# Patient Record
Sex: Female | Born: 1980 | Race: White | Hispanic: No | Marital: Married | Smoking: Never smoker
Health system: Southern US, Community
[De-identification: ages and names within clinical notes are randomized; demographics above are authoritative.]

## PROBLEM LIST (undated history)

## (undated) DIAGNOSIS — IMO0001 Reserved for inherently not codable concepts without codable children: Secondary | ICD-10-CM

## (undated) DIAGNOSIS — F329 Major depressive disorder, single episode, unspecified: Secondary | ICD-10-CM

## (undated) DIAGNOSIS — Z803 Family history of malignant neoplasm of breast: Secondary | ICD-10-CM

## (undated) DIAGNOSIS — Z800 Family history of malignant neoplasm of digestive organs: Secondary | ICD-10-CM

## (undated) DIAGNOSIS — F419 Anxiety disorder, unspecified: Secondary | ICD-10-CM

## (undated) DIAGNOSIS — F32A Depression, unspecified: Secondary | ICD-10-CM

## (undated) DIAGNOSIS — R51 Headache: Secondary | ICD-10-CM

## (undated) DIAGNOSIS — J189 Pneumonia, unspecified organism: Secondary | ICD-10-CM

## (undated) HISTORY — DX: Family history of malignant neoplasm of digestive organs: Z80.0

## (undated) HISTORY — DX: Family history of malignant neoplasm of breast: Z80.3

## (undated) HISTORY — PX: DENTAL SURGERY: SHX609

---

## 2002-06-22 ENCOUNTER — Other Ambulatory Visit: Admission: RE | Admit: 2002-06-22 | Discharge: 2002-06-22 | Payer: Self-pay | Admitting: Obstetrics and Gynecology

## 2003-12-04 ENCOUNTER — Other Ambulatory Visit: Admission: RE | Admit: 2003-12-04 | Discharge: 2003-12-04 | Payer: Self-pay | Admitting: Family Medicine

## 2004-09-09 ENCOUNTER — Other Ambulatory Visit: Admission: RE | Admit: 2004-09-09 | Discharge: 2004-09-09 | Payer: Self-pay | Admitting: Family Medicine

## 2008-01-05 ENCOUNTER — Other Ambulatory Visit: Admission: RE | Admit: 2008-01-05 | Discharge: 2008-01-05 | Payer: Self-pay | Admitting: Family Medicine

## 2009-01-09 ENCOUNTER — Other Ambulatory Visit: Admission: RE | Admit: 2009-01-09 | Discharge: 2009-01-09 | Payer: Self-pay | Admitting: Family Medicine

## 2010-04-01 ENCOUNTER — Other Ambulatory Visit: Admission: RE | Admit: 2010-04-01 | Discharge: 2010-04-01 | Payer: Self-pay | Admitting: Family Medicine

## 2010-06-23 NOTE — L&D Delivery Note (Signed)
Delivery Note  C/C at 1925, spont. Pushing, in birth tub squatting. FHR cat. 1 in 2nd stage. At 8:05 PM a viable female was delivered in squatting position in water tub by CNM via Vaginal, Spontaneous Delivery (Presentation: Left Occiput Anterior, R nuchal arm).  APGAR: 8, 9; weight 7-11 .   Placenta status: Intact, Spontaneous, del on bed.  Cord: 3 vessels with the following complications: None.  Cord pH: n/a. Attempted cord blood collection but no blood in cord after delayed clamping.   Anesthesia: Local  Episiotomy: None Lacerations: 2nd degree Suture Repair: 3.0 vicryl rapide Est. Blood Loss (mL): 300 cc  Mom to postpartum.  Baby to mother's room.Marland Kitchen  Melanie Ramirez 03/21/2011, 9:04 PM

## 2010-08-29 LAB — ABO/RH

## 2010-08-29 LAB — RPR: RPR: NONREACTIVE

## 2011-03-21 ENCOUNTER — Inpatient Hospital Stay (HOSPITAL_COMMUNITY)
Admission: AD | Admit: 2011-03-21 | Discharge: 2011-03-23 | DRG: 373 | Disposition: A | Discharge status: Home / Self Care | Payer: BC Managed Care – PPO | Source: Ambulatory Visit

## 2011-03-21 ENCOUNTER — Encounter (HOSPITAL_COMMUNITY): Payer: Self-pay | Admitting: Obstetrics

## 2011-03-21 DIAGNOSIS — IMO0001 Reserved for inherently not codable concepts without codable children: Secondary | ICD-10-CM

## 2011-03-21 HISTORY — DX: Anxiety disorder, unspecified: F41.9

## 2011-03-21 HISTORY — DX: Major depressive disorder, single episode, unspecified: F32.9

## 2011-03-21 HISTORY — DX: Depression, unspecified: F32.A

## 2011-03-21 HISTORY — DX: Reserved for inherently not codable concepts without codable children: IMO0001

## 2011-03-21 LAB — CBC
HCT: 35.7 % — ABNORMAL LOW (ref 36.0–46.0)
Hemoglobin: 12.9 g/dL (ref 12.0–15.0)
MCH: 32.6 pg (ref 26.0–34.0)
MCHC: 36.1 g/dL — ABNORMAL HIGH (ref 30.0–36.0)
RBC: 3.96 MIL/uL (ref 3.87–5.11)

## 2011-03-21 LAB — ABO/RH: ABO/RH(D): O POS

## 2011-03-21 MED ORDER — FLEET ENEMA 7-19 GM/118ML RE ENEM: 1.0000 | ENEMA | RECTAL | Status: DC | PRN

## 2011-03-21 MED ORDER — NALBUPHINE SYRINGE 5 MG/0.5 ML
10.0000 mg | INJECTION | INTRAMUSCULAR | Status: DC | PRN
  Administered 2011-03-21: 10 mg via INTRAVENOUS
  Filled 2011-03-21 (×2): qty 1

## 2011-03-21 MED ORDER — DIPHENHYDRAMINE HCL 25 MG PO CAPS: 25.0000 mg | ORAL_CAPSULE | Freq: Four times a day (QID) | ORAL | Status: DC | PRN

## 2011-03-21 MED ORDER — OXYTOCIN 20 UNITS IN LACTATED RINGERS INFUSION - SIMPLE
125.0000 mL/h | Freq: Once | INTRAVENOUS | Status: AC
  Administered 2011-03-21: 999 mL/h via INTRAVENOUS

## 2011-03-21 MED ORDER — OXYTOCIN BOLUS FROM INFUSION
500.0000 mL | Freq: Once | INTRAVENOUS | Status: DC
  Filled 2011-03-21: qty 1000
  Filled 2011-03-21: qty 500

## 2011-03-21 MED ORDER — BISACODYL 10 MG RE SUPP: 10.0000 mg | Freq: Every day | RECTAL | Status: DC | PRN

## 2011-03-21 MED ORDER — BENZOCAINE-MENTHOL 20-0.5 % EX AERO
INHALATION_SPRAY | CUTANEOUS | Status: AC
  Administered 2011-03-22: 1 via TOPICAL
  Filled 2011-03-21: qty 56

## 2011-03-21 MED ORDER — INFLUENZA VIRUS VACC SPLIT PF IM SUSP
0.5000 mL | Freq: Once | INTRAMUSCULAR | Status: DC
  Filled 2011-03-21: qty 0.5

## 2011-03-21 MED ORDER — PROMETHAZINE HCL 25 MG/ML IJ SOLN
25.0000 mg | INTRAMUSCULAR | Status: DC | PRN
  Administered 2011-03-21: 25 mg via INTRAVENOUS
  Filled 2011-03-21: qty 1

## 2011-03-21 MED ORDER — ONDANSETRON HCL 4 MG/2ML IJ SOLN: 4.0000 mg | Freq: Four times a day (QID) | INTRAMUSCULAR | Status: DC | PRN

## 2011-03-21 MED ORDER — LACTATED RINGERS IV SOLN: INTRAVENOUS | Status: DC

## 2011-03-21 MED ORDER — IBUPROFEN 600 MG PO TABS: 600.0000 mg | ORAL_TABLET | Freq: Four times a day (QID) | ORAL | Status: DC | PRN

## 2011-03-21 MED ORDER — LANOLIN HYDROUS EX OINT: TOPICAL_OINTMENT | CUTANEOUS | Status: DC | PRN

## 2011-03-21 MED ORDER — ACETAMINOPHEN 325 MG PO TABS: 650.0000 mg | ORAL_TABLET | ORAL | Status: DC | PRN

## 2011-03-21 MED ORDER — IBUPROFEN 600 MG PO TABS
600.0000 mg | ORAL_TABLET | Freq: Four times a day (QID) | ORAL | Status: DC
  Administered 2011-03-21 – 2011-03-23 (×4): 600 mg via ORAL
  Filled 2011-03-21 (×6): qty 1

## 2011-03-21 MED ORDER — ZOLPIDEM TARTRATE 5 MG PO TABS: 5.0000 mg | ORAL_TABLET | Freq: Every evening | ORAL | Status: DC | PRN

## 2011-03-21 MED ORDER — WITCH HAZEL-GLYCERIN EX PADS: 1.0000 "application " | MEDICATED_PAD | CUTANEOUS | Status: DC | PRN

## 2011-03-21 MED ORDER — LACTATED RINGERS IV SOLN
500.0000 mL | INTRAVENOUS | Status: DC | PRN
  Administered 2011-03-21: 500 mL via INTRAVENOUS

## 2011-03-21 MED ORDER — SIMETHICONE 80 MG PO CHEW: 80.0000 mg | CHEWABLE_TABLET | ORAL | Status: DC | PRN

## 2011-03-21 MED ORDER — LIDOCAINE HCL (PF) 1 % IJ SOLN
30.0000 mL | INTRAMUSCULAR | Status: DC | PRN
  Administered 2011-03-21: 30 mL via SUBCUTANEOUS
  Filled 2011-03-21: qty 30

## 2011-03-21 MED ORDER — OXYCODONE-ACETAMINOPHEN 5-325 MG PO TABS: 2.0000 | ORAL_TABLET | ORAL | Status: DC | PRN

## 2011-03-21 MED ORDER — OXYCODONE-ACETAMINOPHEN 5-325 MG PO TABS: 1.0000 | ORAL_TABLET | ORAL | Status: DC | PRN

## 2011-03-21 MED ORDER — CITRIC ACID-SODIUM CITRATE 334-500 MG/5ML PO SOLN: 30.0000 mL | ORAL | Status: DC | PRN

## 2011-03-21 MED ORDER — ONDANSETRON HCL 4 MG PO TABS: 4.0000 mg | ORAL_TABLET | ORAL | Status: DC | PRN

## 2011-03-21 MED ORDER — ONDANSETRON HCL 4 MG/2ML IJ SOLN: 4.0000 mg | INTRAMUSCULAR | Status: DC | PRN

## 2011-03-21 MED ORDER — PRENATAL PLUS 27-1 MG PO TABS
1.0000 | ORAL_TABLET | Freq: Every day | ORAL | Status: DC
Start: 2011-03-22 — End: 2011-03-23
  Administered 2011-03-22: 1 via ORAL
  Filled 2011-03-21: qty 1

## 2011-03-21 MED ORDER — INFLUENZA VIRUS VACC SPLIT PF IM SUSP
0.5000 mL | Freq: Once | INTRAMUSCULAR | Status: AC
  Administered 2011-03-23: 0.5 mL via INTRAMUSCULAR
  Filled 2011-03-21 (×2): qty 0.5

## 2011-03-21 MED ORDER — BENZOCAINE-MENTHOL 20-0.5 % EX AERO
1.0000 "application " | INHALATION_SPRAY | CUTANEOUS | Status: DC | PRN
  Administered 2011-03-22: 1 via TOPICAL

## 2011-03-21 MED ORDER — OXYTOCIN 10 UNIT/ML IJ SOLN
INTRAMUSCULAR | Status: AC
  Filled 2011-03-21: qty 2

## 2011-03-21 MED ORDER — DIBUCAINE 1 % RE OINT: 1.0000 "application " | TOPICAL_OINTMENT | RECTAL | Status: DC | PRN

## 2011-03-21 MED ORDER — SENNOSIDES-DOCUSATE SODIUM 8.6-50 MG PO TABS
2.0000 | ORAL_TABLET | Freq: Every day | ORAL | Status: DC
  Administered 2011-03-22: 2 via ORAL

## 2011-03-21 NOTE — Progress Notes (Signed)
  S: Out of water tub to bed, (+) back pain, vocalizing, doula and spouse supportive at bedside.  O: BP 114/60  Pulse 97  Temp(Src) 98 F (36.7 C) (Oral)  Resp 18  Ht 5\' 4"  (1.626 m)  Wt 79.833 kg (176 lb)  BMI 30.21 kg/m2  SpO2 99%   FHT:  FHR: 125 bpm, variability: moderate,  accelerations:  Present,  decelerations:  Absent UC:   regular, every 2-4 minutes, coupling SVE:   Dilation: 5 Effacement (%): 100 Station: 0 Exam by:: Amarie Viles, CNM (+) show  A / P: Protracted active phase  Fetal Wellbeing:  Category I Pain Control:  Offered IV main meds, encouraged rest in extreme lateral positions to encourage cephalic alignment Plan Nubain and Phenergan  Anticipated MOD:  NSVD  Melanie Ramirez 03/21/2011, 6:05 PM

## 2011-03-21 NOTE — Progress Notes (Signed)
Melanie Ramirez is a 30 y.o. G1P0 at [redacted]w[redacted]d by LMP admitted for active labor, rupture of membranes  Subjective: Laboring in various positions, ctx's more intense now, in shower, desires cervical check   Objective: BP 129/67  Pulse 98  Temp(Src) 98 F (36.7 C) (Oral)  Resp 16  Ht 5\' 4"  (1.626 m)  Wt 79.833 kg (176 lb)  BMI 30.21 kg/m2       FHT:  FHR: 120 bpm, variability: moderate,  accelerations:  Present,  decelerations:  Absent UC:   regular, every 2-3 minutes SVE:   Dilation: 4 Effacement (%): 100 Station: 0 Exam by:: Renae Fickle, CNM  Labs: Lab Results  Component Value Date   WBC 12.9* 03/21/2011   HGB 12.9 03/21/2011   HCT 35.7* 03/21/2011   MCV 90.2 03/21/2011   PLT 191 03/21/2011    Assessment / Plan: Spontaneous labor, progressing normally  Labor: early labor, moving into active stage Preeclampsia:  no signs or symptoms of toxicity Fetal Wellbeing:  Category I Pain Control:  Labor support without medications and hydroherapy. Plan to water tub now. I/D:  n/a Anticipated MOD:  NSVD  PAUL,DANIELA 03/21/2011, 4:11 PM

## 2011-03-21 NOTE — H&P (Signed)
Melanie Ramirez is a 30 y.o. G1P0 at [redacted]w[redacted]d presenting for rupture of membranes and contractions. Pt notes contractions started last night at 10:30 with ROM, increased in intensity since mid morning . Good fetal movement, No vaginal bleeding, + leaking fluid , clear.  Prenatal Course Source of Care:[facility] - Renae Fickle, CNM primary  with onset of care at 9 weeks Pregnancy complications or risk: none Current medications: PNV, evening primrose oil caps  OB History    Grav Para Term Preterm Abortions TAB SAB Ect Mult Living   1              Past Medical History  Diagnosis Date  . Anxiety   . Depression    Past Surgical History  Procedure Date  . Dental surgery    Family History: family history is not on file. Social History:  reports that she has never smoked. She does not have any smokeless tobacco history on file. She reports that she does not drink alcohol or use illicit drugs.  Review of Systems - Negative except as noted    Blood pressure 117/71, pulse 89, temperature 97.6 F (36.4 C), temperature source Oral, resp. rate 16, height 5\' 4"  (1.626 m), weight 79.833 kg (176 lb).  Physical Exam: Blood pressure 117/71, pulse 89, temperature 97.6 F (36.4 C), temperature source Oral, resp. rate 16, height 5\' 4"  (1.626 m), weight 79.833 kg (176 lb). General: NAD Heart: RRR, no murmurs Lungs: CTA b/l  Abd: Soft, NT, EFW 7lbs  Ext: no edema Neuro: DTRs normal Other:    Membranes: grossly SROM at 10:30 pm 9/27 Vaginal bleeding:none Speculum Exam: na Dilation: 3/Effacement (%): 90/Station: -1/vertex/Exam by:: Paul,CNM FHR:  Baseline rate 120   Variability moderate  Accelerations present  Decelerations none Contractions: Frequency 4-5  Duration 60+  Intensity mild/mod  Pertinent Labs/Studies:   CBC    Component Value Date/Time   WBC 12.9* 03/21/2011 1156   RBC 3.96 03/21/2011 1156   HGB 12.9 03/21/2011 1156   HCT 35.7* 03/21/2011 1156   PLT 191 03/21/2011 1156   MCV 90.2 03/21/2011 1156    MCH 32.6 03/21/2011 1156   MCHC 36.1* 03/21/2011 1156   RDW 12.8 03/21/2011 1156    RPR pending  Prenatal labs: ABO, Rh: O/Positive/-- (03/08 0000) Antibody:  neg Rubella:  immune RPR: Nonreactive (03/08 0000)  HBsAg: Negative (03/08 0000)  HIV: Non-reactive (03/08 0000)  GBS: Negative (09/15 0000)  1 hr Glucola 124  Genetic screening declined Ultrasound: Weeks 36 Result EFW 7-3 (89%), AC 97%, nl AFI, post. Placenta, vtx  Assessment: 30 y.o. G1P0 at [redacted]w[redacted]d  1. Labor: early 2. Fetal Wellbeing: Category I  3. Pain Control: relaxation methods / doula, desires water birth 4. GBS: neg 5. 38 week IUP, SROM  Plan:  1. Admit to BS 2. Routine L&D orders, up ad lib w/ intermittent EFM, reg. diet 3. Analgesia/anesthesia PRN     Consultant: Dr. Alesia Banda 03/21/2011, 1:22 PM

## 2011-03-21 NOTE — Progress Notes (Signed)
S: Rested in bed x  1 hr with Nubain and phenergan, extreme lateral positioning. Started feeling urge to push at 7:00 PM, svx 8/c/0 by RN. To water birth tub for transition.  O: BP 117/65  Pulse 108  Temp(Src) 98.3 F (36.8 C) (Axillary)  Resp 20  Ht 5\' 4"  (1.626 m)  Wt 79.833 kg (176 lb)  BMI 30.21 kg/m2  SpO2 99%   FHT:  FHR: 130 bpm, variability: moderate,  accelerations:  Present,  decelerations:  Present early UC:   regular, every 2-3 minutes SVE:   Dilation: 10 Effacement (%): 100 Station: +1 Exam by:: Renae Fickle, CNM   A / P: Spontaneous labor, progressing normally  Fetal Wellbeing:  Category I Pain Control:  IV meds and hydrotherapy  Anticipated MOD:  NSVD  Tanayah Squitieri 03/21/2011, 7:24 PM

## 2011-03-22 LAB — CBC
HCT: 30.6 % — ABNORMAL LOW (ref 36.0–46.0)
MCH: 32.2 pg (ref 26.0–34.0)
MCV: 91.3 fL (ref 78.0–100.0)
Platelets: 179 10*3/uL (ref 150–400)
RDW: 12.9 % (ref 11.5–15.5)

## 2011-03-22 NOTE — Progress Notes (Signed)
PSYCHOSOCIAL ASSESSMENT ~ MATERNAL/CHILD Name:Alise Berniece Andreas :  BabySamson Frederic     Age: 30  Referral Date 03/22/11    Reason/Source: CN-History of anxiety and depression I. FAMILY/HOME ENVIRONMENT A. Child's Legal Guardian __X_Parent(s) ___Grandparent ___Foster parent ___DSS_________________ Name Laurita Quint   DOB 1981/04/11  Age 21 Address: [address] [place], [place] Name_______________________________ DOB___/____/____ Age_____ Address________________________________________________________ B. Other Household Members/Support Persons Name: FOB       Relationship____________ DOB ___/___/___ Name_____________________Relationship____________ DOB ___/___/___ Name_____________________Relationship____________ DOB ___/___/___ Name_____________________Relationship____________ DOB ___/___/___ C. Other Support: Supportive friends and family II. PSYCHOSOCIAL DATA A. Information Source _X_Patient Interview _X_Family Interview __Other___________ B. Archivist __________________________________________________ __Medicaid County__________ _X_Private Insurance: BCBS __Self Pay  __Food Stamps __WIC __Work First __Public Housing __Section 8  __Maternity Care Coordination/Child Service Coordination/Early Intervention _________________________________________________________________School _____________________________________Grade____________ _X_Other: [employer]  C. Cultural and Environment Information Cultural Issues Impacting Care: None reported III. STRENGTHS _X__Supportive family/friends _X__Adequate Resources _X__Compliance with medical plan _X__Home prepared for Child (including basic supplies) _X_Understanding of illness  ___Other__________________________________________________________ IV. RISK FACTORS AND CURRENT PROBLEMS ____ No Problems Noted Pt Family  Substance Abuse ___ ___  Mental Illness History of anxiety and  depression Family/Relationship Issues ___  ___ Abuse/Neglect/Domestic Violence ___ ___ Financial Resources ___ ___ Transportation ___ ___ DSS Involvement ___ ___ Adjustment to Illness ___ ___ Knowledge/Cognitive Deficit ___ ___ Compliance with Treatment ___ ___ Basic Needs (food, housing, etc.) ___ ___ Housing Concerns ___ ___ Other_____________________________________________________________ V. SOCIAL WORK ASSESSMENT  CSW received referral due to MOB having a history of anxiety and depression. CSW met with MOB and FOB in room. MOB was agreeable to FOB staying in room for assessment. CSW spoke with MOB regarding having adequate supplies and support for baby. MOB stated this is their first child but have all necessary supplies. MOB reports that they have supportive friends and family. CSW spoke with MOB regarding her history of anxiety and depression. Per MOB, history of anxiety and depression are present in her family. MOB was completely aware of PPD and reports she has already spoken to her midwife about this. Midwife agrees to continue to assess MOB for any symptoms and agreeable to making referrals if needed. CSW provided MOB with "Feelings After Birth" brochure and educated MOB and FOB on symptoms of baby blues and PPD. MOB was appreciative of CSW consult.    VI. SOCIAL WORK PLAN _X__No Further Intervention Required/No Barriers to Discharge  ___Psychosocial Support and Ongoing Assessment of Needs  _X__Patient/Family Education: "Feelings After Birth" Brochure ___Child Protective Services Report County___________ Date___/____/____  ___Information/Referral to [employer]_________________________  ___Other__________________________________________________________

## 2011-03-22 NOTE — Progress Notes (Signed)
Post Partum Day #1            Subjective:   Pt reports feeling well, got to rest last night, sitting in chair, infant at breast/ Tolerating po/ Voiding without problems/ No n/v/ Bleeding is moderate/ Pain controlled withprescription NSAID's including ibuprofen (Motrin)  Newborn info:  Information for the patient's newborn:  Jadalynn, Burr [[id number]]  female   / feeding breast, doing well          Objective:     VS: Blood pressure 125/74, pulse 78, temperature 98 F (36.7 C), temperature source Oral, resp. rate 22, height 5\' 4"  (1.626 m), weight 79.833 kg (176 lb), SpO2 95.00%, unknown if currently breastfeeding.   Intake/Output Summary (Last 24 hours) at 03/22/11 1314 Last data filed at 03/21/11 2151  Gross per 24 hour  Intake      0 ml  Output      0 ml  Net      0 ml        Basename 03/22/11 0517 03/21/11 1156  WBC 13.8* 12.9*  HGB 10.8* 12.9  HCT 30.6* 35.7*  PLT 179 191     Blood type: --/--/O POS (09/28 1156)  Rubella:   immune   Physical Exam:   A & O x 3 NAD   Lungs: CTAB  Heart: RR  Abdomen: soft, non-tender, FF @ U  Perineum: deferred - breastfeeding  Lochia: small  Extremities: neg Homans', edema none    Assessment/Plan:  PPD # 1 / 30 y.o., G1P1001 S/P:spontaneous vaginal Principal Problem:  *Postpartum care following vaginal delivery (9/28)     normal postpartum exam  Doing well  Continue routine post partum orders  Anticipate discharge home in AM.      LOS: 1 day   Melanie Ramirez, CNM, MSN 03/22/2011, 1:14 PM

## 2011-03-23 MED ORDER — IBUPROFEN 800 MG PO TABS: 800.0000 mg | ORAL_TABLET | Freq: Three times a day (TID) | ORAL | Status: AC | PRN

## 2011-03-23 NOTE — Progress Notes (Signed)
Post Partum Day #2           Information for the patient's newborn:  Shalane, Florendo [[id number]]  female    Subjective: No HA, SOB, CP, F/C, breast symptoms. Pain minimal. Normal vaginal bleeding, no clots.    Feeding: breast  Objective: BP 116/77  Pulse 82  Temp(Src) 98.3 F (36.8 C) (Oral)  Resp 18  Ht 5\' 4"  (1.626 m)  Wt 79.833 kg (176 lb)  BMI 30.21 kg/m2  SpO2 99%  Breastfeeding? Unknown  No intake or output data in the 24 hours ending 03/23/11 1020     Basename 03/22/11 0517 03/21/11 1156  WBC 13.8* 12.9*  HGB 10.8* 12.9  HCT 30.6* 35.7*  PLT 179 191    Blood type: --/--/O POS (09/28 1156) Rubella:   immune   Physical Exam:  General: alert, cooperative and no distress Uterine Fundus: firm Lochia: appropriate Perineum: repair intact, edema none DVT Evaluation: Negative Homan's sign. No significant calf/ankle edema.    Assessment/Plan: PPD # 2 / 30 y.o., G1P1001 S/P:spontaneous vaginal waterbirth  Principal Problem:  *Postpartum care following vaginal delivery (9/28)    normal postpartum exam  Continue current postpartum care  D/C home   LOS: 2 days   PAUL,DANIELA, CNM, MSN 03/23/2011, 10:20 AM

## 2011-03-23 NOTE — Discharge Summary (Signed)
Obstetric Discharge Summary Reason for Admission: onset of labor and rupture of membranes Prenatal Procedures: ultrasound Intrapartum Procedures: spontaneous vaginal delivery and water birth Postpartum Procedures: influenza vaccine Complications-Operative and Postpartum: 2nd degree perineal laceration Hemoglobin  Date Value Range Status  03/22/2011 10.8* 12.0-15.0 (g/dL) Final     HCT  Date Value Range Status  03/22/2011 30.6* 36.0-46.0 (%) Final    Discharge Diagnoses: Term Pregnancy-delivered  Discharge Information: Date: 03/23/2011 Activity: pelvic rest Diet: routine Medications: PNV and Ibuprofen Condition: stable Instructions: refer to practice specific booklet Discharge to: home Follow-up Information    Follow up with Sakina Briones in 6 weeks.   Contact information:   [address] [address] [phone]          Newborn Data: Live born female "Melanie Ramirez" Birth Weight: 7 lb 11 oz (3487 g) APGAR: 8, 9  Home with mother.  Melanie Ramirez 03/23/2011, 10:23 AM

## 2011-04-01 NOTE — Progress Notes (Signed)
UR Chart review completed.  

## 2013-06-23 NOTE — L&D Delivery Note (Signed)
Delivery Note  First Stage: Labor onset: 0015 on 12/24 Augmentation : pitocin at midnight for protracted labor secondary to inadequate ctx Analgesia /Anesthesia intrapartum: nubain 10 mg IV x 1 and rested until awoke with urge to push SROM at 2400 on 12/23  Second Stage:  Pitocin off - IV saline locked 0145 with urge to push - requesting to enter tub now (water temp 98) Complete dilation at 0155  Onset of pushing at 0155 involuntarily pushing FHR second stage 130 - continuous to intermittent with transition to tub  Assisted into tub between ctx vtx +3 @ 0200  Delivery of a viable female at [place] by CNM in LOA position no nuchal cord Cord double clamped after cessation of pulsation, cut by FOB  Baby to Dad for skin to skin while Mom assisted out of tub to bed Cord blood sample collected   Third Stage: Placenta delivered [facility] intact with 3 VC @ 0224 Placenta disposition: for hospital disposal Uterine tone fimr / bleeding small IV infiltrated - DC'd  / Pitocin 10 units IM  1st degree perineal with vaginal extension identified  Anesthesia for repair: 1% xylocaine Repair 3-0 vicryl vaginal closure with transition to perineal body then 4-0 vicryl subcuticular Est. Blood Loss (mL): 503  Complications: none  Mom to postpartum.  Baby to Couplet care / Skin to Skin.  Newborn: Birth Weight: 7 pounds - 5 ounces Apgar Scores: 9-9 Feeding planned: breast  Artelia Laroche CNM, MSN, [place] 06/16/2014, 2:45 AM

## 2013-07-25 ENCOUNTER — Other Ambulatory Visit: Payer: Self-pay | Admitting: Obstetrics and Gynecology

## 2013-07-25 ENCOUNTER — Encounter (HOSPITAL_COMMUNITY): Payer: Self-pay | Admitting: Pharmacist

## 2013-07-26 ENCOUNTER — Encounter (HOSPITAL_COMMUNITY): Payer: Self-pay

## 2013-07-28 NOTE — H&P (Signed)
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  HISTORY OF PRESENT ILLNESS:  The patient is a 33 year old white female, G2, P1, with recently diagnosed fetal abnormalities to include cystic hygroma and noninvasive prenatal DNA testing consistent with monosomy X. The patient wishes to proceed with termination of pregnancy.  MEDICATIONS:  Prenatal vitamins.  ALLERGIES:  None.  FAMILY HISTORY:  Anemia, thyroid disease, breast cancer, kidney stones.  SOCIAL HISTORY:  Noncontributory.  SURGICAL HISTORY:  Noncontributory.  OBSTETRIC HISTORY:  Remarkable for vaginal delivery x1.  PHYSICAL EXAMINATION:  GENERAL:  She is a well-developed, well- nourished, white female, in no acute distress. HEENT:  Normal. NECK:  Supple.  Full range of motion. LUNGS:  Clear. ABDOMEN:  Soft, nontender. PELVIC:  Uterus is 12-14 weeks size.  No adnexal masses. EXTREMITIES:  There are no cords. NEUROLOGIC:  Nonfocal. SKIN:  Intact.  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  PLAN:  Proceed with late trimester termination of pregnancy under ultrasound guidance.  Risks of surgery to include anesthesia, infection, bleeding, uterine perforation, possible need for repair, delayed versus immediate complications to include bowel and bladder injury noted.  The patient acknowledges and wishes to proceed.     Lovenia Kim, M.D.     RJT/MEDQ  D:  07/28/2013  T:  07/28/2013  Job:  [id number]

## 2013-07-29 ENCOUNTER — Encounter (HOSPITAL_COMMUNITY): Payer: BC Managed Care – PPO | Admitting: Anesthesiology

## 2013-07-29 ENCOUNTER — Encounter (HOSPITAL_COMMUNITY)
Admission: RE | Disposition: A | Discharge status: Home / Self Care | Payer: Self-pay | Source: Ambulatory Visit | Attending: Obstetrics and Gynecology

## 2013-07-29 ENCOUNTER — Ambulatory Visit (HOSPITAL_COMMUNITY)
Admission: RE | Admit: 2013-07-29 | Discharge: 2013-07-29 | Disposition: A | Discharge status: Home / Self Care | Payer: BC Managed Care – PPO | Source: Ambulatory Visit | Attending: Obstetrics and Gynecology | Admitting: Obstetrics and Gynecology

## 2013-07-29 ENCOUNTER — Ambulatory Visit (HOSPITAL_COMMUNITY): Payer: BC Managed Care – PPO | Admitting: Anesthesiology

## 2013-07-29 ENCOUNTER — Encounter (HOSPITAL_COMMUNITY): Payer: Self-pay | Admitting: Anesthesiology

## 2013-07-29 ENCOUNTER — Ambulatory Visit (HOSPITAL_COMMUNITY): Payer: BC Managed Care – PPO

## 2013-07-29 DIAGNOSIS — Q998 Other specified chromosome abnormalities: Secondary | ICD-10-CM

## 2013-07-29 DIAGNOSIS — O039 Complete or unspecified spontaneous abortion without complication: Secondary | ICD-10-CM | POA: Insufficient documentation

## 2013-07-29 DIAGNOSIS — Q958 Other balanced rearrangements and structural markers: Secondary | ICD-10-CM

## 2013-07-29 DIAGNOSIS — Z332 Encounter for elective termination of pregnancy: Principal | ICD-10-CM

## 2013-07-29 DIAGNOSIS — O358XX0 Maternal care for other (suspected) fetal abnormality and damage, not applicable or unspecified: Secondary | ICD-10-CM | POA: Insufficient documentation

## 2013-07-29 HISTORY — PX: DILATION AND EVACUATION: SHX1459

## 2013-07-29 HISTORY — DX: Headache: R51

## 2013-07-29 HISTORY — DX: Pneumonia, unspecified organism: J18.9

## 2013-07-29 LAB — CBC
HCT: 37.1 % (ref 36.0–46.0)
Hemoglobin: 13.4 g/dL (ref 12.0–15.0)
MCH: 30.5 pg (ref 26.0–34.0)
MCHC: 36.1 g/dL — ABNORMAL HIGH (ref 30.0–36.0)
MCV: 84.5 fL (ref 78.0–100.0)
PLATELETS: 169 10*3/uL (ref 150–400)
RBC: 4.39 MIL/uL (ref 3.87–5.11)
RDW: 12.7 % (ref 11.5–15.5)
WBC: 7.5 10*3/uL (ref 4.0–10.5)

## 2013-07-29 SURGERY — DILATION AND EVACUATION, UTERUS, SECOND TRIMESTER
Anesthesia: Monitor Anesthesia Care | Site: Vagina

## 2013-07-29 MED ORDER — ONDANSETRON HCL 4 MG/2ML IJ SOLN
INTRAMUSCULAR | Status: DC | PRN
  Administered 2013-07-29: 4 mg via INTRAVENOUS

## 2013-07-29 MED ORDER — LIDOCAINE HCL (CARDIAC) 20 MG/ML IV SOLN
INTRAVENOUS | Status: DC | PRN
  Administered 2013-07-29: 50 mg via INTRAVENOUS

## 2013-07-29 MED ORDER — PROMETHAZINE HCL 25 MG/ML IJ SOLN: 6.2500 mg | INTRAMUSCULAR | Status: DC | PRN

## 2013-07-29 MED ORDER — MIDAZOLAM HCL 2 MG/2ML IJ SOLN: 0.5000 mg | Freq: Once | INTRAMUSCULAR | Status: DC | PRN

## 2013-07-29 MED ORDER — TRAMADOL HCL 50 MG PO TABS: 50.0000 mg | ORAL_TABLET | Freq: Four times a day (QID) | ORAL | Status: DC | PRN

## 2013-07-29 MED ORDER — FENTANYL CITRATE 0.05 MG/ML IJ SOLN: 25.0000 ug | INTRAMUSCULAR | Status: DC | PRN

## 2013-07-29 MED ORDER — PROPOFOL INFUSION 10 MG/ML OPTIME
INTRAVENOUS | Status: DC | PRN
  Administered 2013-07-29: 160 ug/kg/min via INTRAVENOUS

## 2013-07-29 MED ORDER — MIDAZOLAM HCL 5 MG/5ML IJ SOLN
INTRAMUSCULAR | Status: DC | PRN
  Administered 2013-07-29: 2 mg via INTRAVENOUS

## 2013-07-29 MED ORDER — MEPERIDINE HCL 25 MG/ML IJ SOLN: 6.2500 mg | INTRAMUSCULAR | Status: DC | PRN

## 2013-07-29 MED ORDER — ONDANSETRON HCL 4 MG/2ML IJ SOLN
INTRAMUSCULAR | Status: AC
  Filled 2013-07-29: qty 2

## 2013-07-29 MED ORDER — KETOROLAC TROMETHAMINE 30 MG/ML IJ SOLN: 15.0000 mg | Freq: Once | INTRAMUSCULAR | Status: DC | PRN

## 2013-07-29 MED ORDER — BUPIVACAINE HCL (PF) 0.25 % IJ SOLN
INTRAMUSCULAR | Status: AC
Start: 2013-07-29 — End: 2013-07-29
  Filled 2013-07-29: qty 30

## 2013-07-29 MED ORDER — FENTANYL CITRATE 0.05 MG/ML IJ SOLN
INTRAMUSCULAR | Status: AC
  Filled 2013-07-29: qty 2

## 2013-07-29 MED ORDER — BUPIVACAINE HCL (PF) 0.25 % IJ SOLN
INTRAMUSCULAR | Status: AC
  Filled 2013-07-29: qty 10

## 2013-07-29 MED ORDER — LIDOCAINE HCL (CARDIAC) 20 MG/ML IV SOLN
INTRAVENOUS | Status: AC
  Filled 2013-07-29: qty 5

## 2013-07-29 MED ORDER — BUPIVACAINE HCL (PF) 0.25 % IJ SOLN
INTRAMUSCULAR | Status: DC | PRN
  Administered 2013-07-29: 20 mL

## 2013-07-29 MED ORDER — KETOROLAC TROMETHAMINE 30 MG/ML IJ SOLN
INTRAMUSCULAR | Status: AC
  Filled 2013-07-29: qty 1

## 2013-07-29 MED ORDER — LACTATED RINGERS IV SOLN
INTRAVENOUS | Status: DC
  Administered 2013-07-29: 11:00:00 via INTRAVENOUS

## 2013-07-29 MED ORDER — PROPOFOL 10 MG/ML IV EMUL
INTRAVENOUS | Status: AC
  Filled 2013-07-29: qty 40

## 2013-07-29 MED ORDER — MIDAZOLAM HCL 2 MG/2ML IJ SOLN
INTRAMUSCULAR | Status: AC
  Filled 2013-07-29: qty 2

## 2013-07-29 MED ORDER — KETOROLAC TROMETHAMINE 60 MG/2ML IM SOLN
INTRAMUSCULAR | Status: DC | PRN
  Administered 2013-07-29: 30 mg via INTRAMUSCULAR

## 2013-07-29 MED ORDER — FENTANYL CITRATE 0.05 MG/ML IJ SOLN
INTRAMUSCULAR | Status: DC | PRN
  Administered 2013-07-29 (×2): 50 ug via INTRAVENOUS

## 2013-07-29 MED ORDER — CEFAZOLIN SODIUM-DEXTROSE 2-3 GM-% IV SOLR
2.0000 g | INTRAVENOUS | Status: AC
  Administered 2013-07-29: 2 g via INTRAVENOUS

## 2013-07-29 SURGICAL SUPPLY — 18 items
CLOTH BEACON ORANGE TIMEOUT ST (SAFETY) ×2 IMPLANT
CONT PATH 16OZ SNAP LID 3702 (MISCELLANEOUS) IMPLANT
DRAPE HYSTEROSCOPY (DRAPE) ×2 IMPLANT
GLOVE BIO SURGEON STRL SZ7.5 (GLOVE) ×2 IMPLANT
GOWN PREVENTION PLUS XLARGE (GOWN DISPOSABLE) ×2 IMPLANT
GOWN STRL REIN XL XLG (GOWN DISPOSABLE) ×4 IMPLANT
KIT BERKELEY 2ND TRIMESTER 1/2 (COLLECTOR) ×2 IMPLANT
NEEDLE SPNL 22GX3.5 QUINCKE BK (NEEDLE) ×2 IMPLANT
NS IRRIG 1000ML POUR BTL (IV SOLUTION) ×2 IMPLANT
PACK VAGINAL MINOR WOMEN LF (CUSTOM PROCEDURE TRAY) ×2 IMPLANT
PAD OB MATERNITY 4.3X12.25 (PERSONAL CARE ITEMS) ×2 IMPLANT
PAD PREP 24X48 CUFFED NSTRL (MISCELLANEOUS) ×2 IMPLANT
SYR CONTROL 10ML LL (SYRINGE) ×2 IMPLANT
TOWEL OR 17X24 6PK STRL BLUE (TOWEL DISPOSABLE) ×4 IMPLANT
TUBE VACURETTE 2ND TRIMESTER (CANNULA) ×2 IMPLANT
VACURETTE 12 RIGID CVD (CANNULA) ×2 IMPLANT
VACURETTE 14MM CVD 1/2 BASE (CANNULA) IMPLANT
VACURETTE 16MM ASPIR CVD .5 (CANNULA) IMPLANT

## 2013-07-29 NOTE — Anesthesia Preprocedure Evaluation (Addendum)
Anesthesia Evaluation  Patient identified by MRN, date of birth, ID band Patient awake    Reviewed: Allergy & Precautions, H&P , NPO status , Patient's Chart, lab work & pertinent test results  Airway Mallampati: II      Dental   Pulmonary  breath sounds clear to auscultation        Cardiovascular Exercise Tolerance: Good Rhythm:regular Rate:Normal     Neuro/Psych    GI/Hepatic   Endo/Other    Renal/GU      Musculoskeletal   Abdominal   Peds  Hematology   Anesthesia Other Findings Patient states gestational age at end of 1st trimester.  No nausea or heartburn  Reproductive/Obstetrics (+) Pregnancy (fetal anomalies)                         Anesthesia Physical Anesthesia Plan  ASA: II  Anesthesia Plan: MAC   Post-op Pain Management:    Induction:   Airway Management Planned:   Additional Equipment:   Intra-op Plan:   Post-operative Plan:   Informed Consent: I have reviewed the patients History and Physical, chart, labs and discussed the procedure including the risks, benefits and alternatives for the proposed anesthesia with the patient or authorized representative who has indicated his/her understanding and acceptance.     Plan Discussed with: Anesthesiologist, CRNA and Surgeon  Anesthesia Plan Comments:        Anesthesia Quick Evaluation

## 2013-07-29 NOTE — Op Note (Signed)
07/29/2013  12:07 PM  PATIENT:  Ivonne Andrew  33 y.o. female  PRE-OPERATIVE DIAGNOSIS:  Cystic Hygroma, Monosomy X  POST-OPERATIVE DIAGNOSIS:  Cystic Hygroma, Monosomy X  PROCEDURE:  Procedure(s): Ultrasound guided Suction DILATATION AND EVACUATION (D&E) 2ND TRIMESTER with tissue sent for chromosome analysis  SURGEON:  Surgeon(s): Lovenia Kim, MD  ASSISTANTS: none   ANESTHESIA:   local and IV sedation  ESTIMATED BLOOD LOSS: 50cc   DRAINS: none   LOCAL MEDICATIONS USED:  MARCAINE     SPECIMEN:  Source of Specimen:  POC and tissue for chromosomes  DISPOSITION OF SPECIMEN:  PATHOLOGY  COUNTS:  YES  DICTATION #: [id number]  PLAN OF CARE: dc home  PATIENT DISPOSITION:  PACU - hemodynamically stable.

## 2013-07-29 NOTE — Transfer of Care (Signed)
Immediate Anesthesia Transfer of Care Note  Patient: Melanie Ramirez  Procedure(s) Performed: Procedure(s): Ultrasound guided Suction DILATATION AND EVACUATION (D&E) 2ND TRIMESTER with tissue sent for chromosome analysis (N/A)  Patient Location: PACU  Anesthesia Type:MAC  Level of Consciousness: awake, alert  and oriented  Airway & Oxygen Therapy: Patient Spontanous Breathing  Post-op Assessment: Report given to PACU RN and Post -op Vital signs reviewed and stable  Post vital signs: Reviewed and stable  Complications: No apparent anesthesia complications

## 2013-07-29 NOTE — Discharge Instructions (Signed)

## 2013-07-29 NOTE — Anesthesia Postprocedure Evaluation (Signed)
  Anesthesia Post-op Note  Anesthesia Post Note  Patient: Melanie Ramirez  Procedure(s) Performed: Procedure(s) (LRB): Ultrasound guided Suction DILATATION AND EVACUATION (D&E) 2ND TRIMESTER with tissue sent for chromosome analysis (N/A)  Anesthesia type: MAC  Patient location: PACU  Post pain: Pain level controlled  Post assessment: Post-op Vital signs reviewed  Last Vitals:  Filed Vitals:   07/29/13 1300  BP: 104/53  Pulse: 62  Temp: 37.3 C  Resp: 16    Post vital signs: Reviewed  Level of consciousness: sedated  Complications: No apparent anesthesia complications

## 2013-07-29 NOTE — Progress Notes (Signed)
Patient ID: Melanie Ramirez, female   DOB: 02-14-1981, 33 y.o.   MRN: [id number] Patient seen and examined. Consent witnessed and signed. No changes noted. Update completed.

## 2013-07-30 NOTE — Op Note (Signed)
NAMEHADLIE, Melanie Ramirez NO.:  [id number]  MEDICAL RECORD NO.:  [id number]  LOCATION:  [facility]                          FACILITY:  [facility]  PHYSICIAN:  Lovenia Kim, M.D.DATE OF BIRTH:  1981-04-23  DATE OF PROCEDURE:  07/29/2013 DATE OF DISCHARGE:  07/29/2013                              OPERATIVE REPORT   PREOPERATIVE DIAGNOSIS:  Cystic hygroma and monosomy X.  POSTOPERATIVE DIAGNOSIS:  Cystic hygroma and monosomy X.  PROCEDURE:  Ultrasound-guided late trimester suction dilation and evacuation.  SURGEON:  Lovenia Kim, MD  ASSISTANT:  None.  ANESTHESIA:  Local and IV sedation.  ESTIMATED BLOOD LOSS:  50 mL.  COMPLICATIONS:  None.  DRAINS:  None.  COUNTS:  Correct.  The patient to recovery in good condition.  BRIEF OPERATIVE NOTE:  After being apprised of the risks of anesthesia, infection, bleeding, injury to surrounding organs, possible need for repair, delayed versus immediate complications to include bowel and bladder injury, possible need for repair, the patient was brought to the operating room where she was administered IV anesthesia  and sedation without difficulty.  Prepped and draped in usual sterile fashion. Catheterized until the bladder was empty.  A dilute Marcaine solution placed for a standard paracervical block after preoperative removal of 2 laminaria and two 4 x 4 gauze sponges.  Cervix easily dilated up to a #39 Pratt dilator, 12-mm suction curette placed.  Amniotomy, clear fluid under ultrasound guidance.  Fetal heart tones were noted preprocedure to be in the 200-beat per minute range.  Under ultrasound guidance with the use of Sopher forceps, a tissue extraction was performed, suction assisted and ultrasound assisted until the uterine cavity was evacuated. Tissue was collected and sent for chromosomal analysis.  Blunt curettage in a 4-quadrant method revealed the cavity to be empty.  Good hemostasis was noted.   All instruments were removed.  Ultrasound revealed the cavity to be empty. The patient tolerated the procedure well, was transferred to recovery in good condition.     Lovenia Kim, M.D.     RJT/MEDQ  D:  07/29/2013  T:  07/30/2013  Job:  [id number]

## 2013-08-01 ENCOUNTER — Encounter (HOSPITAL_COMMUNITY): Payer: Self-pay | Admitting: Obstetrics and Gynecology

## 2013-08-10 LAB — CHROMOSOME STD, POC(TISSUE)-NCBH

## 2013-08-11 LAB — TISSUE HYBRIDIZATION TO NCBH

## 2013-11-24 LAB — OB RESULTS CONSOLE HIV ANTIBODY (ROUTINE TESTING): HIV: NONREACTIVE

## 2013-11-24 LAB — OB RESULTS CONSOLE RUBELLA ANTIBODY, IGM: Rubella: IMMUNE

## 2013-11-24 LAB — OB RESULTS CONSOLE GBS: STREP GROUP B AG: NEGATIVE

## 2013-12-02 LAB — OB RESULTS CONSOLE GC/CHLAMYDIA
Chlamydia: NEGATIVE
GC PROBE AMP, GENITAL: NEGATIVE

## 2014-04-24 ENCOUNTER — Encounter (HOSPITAL_COMMUNITY): Payer: Self-pay | Admitting: Obstetrics and Gynecology

## 2014-05-22 LAB — OB RESULTS CONSOLE GBS: STREP GROUP B AG: NEGATIVE

## 2014-06-15 ENCOUNTER — Encounter (HOSPITAL_COMMUNITY): Payer: Self-pay | Admitting: *Deleted

## 2014-06-15 ENCOUNTER — Inpatient Hospital Stay (HOSPITAL_COMMUNITY)
Admission: AD | Admit: 2014-06-15 | Discharge: 2014-06-17 | DRG: 775 | Disposition: A | Discharge status: Home / Self Care | Payer: BC Managed Care – PPO | Source: Ambulatory Visit | Attending: Obstetrics | Admitting: Obstetrics

## 2014-06-15 DIAGNOSIS — Z3A39 39 weeks gestation of pregnancy: Secondary | ICD-10-CM | POA: Diagnosis present

## 2014-06-15 DIAGNOSIS — O09293 Supervision of pregnancy with other poor reproductive or obstetric history, third trimester: Secondary | ICD-10-CM

## 2014-06-15 DIAGNOSIS — E860 Dehydration: Secondary | ICD-10-CM | POA: Diagnosis present

## 2014-06-15 LAB — CBC
HCT: 39.4 % (ref 36.0–46.0)
Hemoglobin: 14.2 g/dL (ref 12.0–15.0)
MCH: 32.5 pg (ref 26.0–34.0)
MCHC: 36 g/dL (ref 30.0–36.0)
MCV: 90.2 fL (ref 78.0–100.0)
Platelets: 168 10*3/uL (ref 150–400)
RBC: 4.37 MIL/uL (ref 3.87–5.11)
RDW: 12.7 % (ref 11.5–15.5)
WBC: 12 10*3/uL — ABNORMAL HIGH (ref 4.0–10.5)

## 2014-06-15 LAB — TYPE AND SCREEN
ABO/RH(D): O POS
Antibody Screen: NEGATIVE

## 2014-06-15 LAB — OB RESULTS CONSOLE RPR: RPR: NONREACTIVE

## 2014-06-15 MED ORDER — LACTATED RINGERS IV BOLUS (SEPSIS): 500.0000 mL | Freq: Once | INTRAVENOUS | Status: DC

## 2014-06-15 MED ORDER — OXYTOCIN 40 UNITS IN LACTATED RINGERS INFUSION - SIMPLE MED
1.0000 m[IU]/min | INTRAVENOUS | Status: DC
  Administered 2014-06-16: 2 m[IU]/min via INTRAVENOUS
  Filled 2014-06-15: qty 1000

## 2014-06-15 MED ORDER — DOCUSATE SODIUM 100 MG PO CAPS: 100.0000 mg | ORAL_CAPSULE | Freq: Every day | ORAL | Status: DC | PRN

## 2014-06-15 MED ORDER — LACTATED RINGERS IV SOLN
INTRAVENOUS | Status: DC
  Administered 2014-06-16: via INTRAVENOUS

## 2014-06-15 MED ORDER — OXYTOCIN 10 UNIT/ML IJ SOLN
10.0000 [IU] | Freq: Once | INTRAMUSCULAR | Status: DC
  Administered 2014-06-16: 10 [IU] via INTRAMUSCULAR
  Filled 2014-06-15: qty 1

## 2014-06-15 MED ORDER — OXYCODONE-ACETAMINOPHEN 5-325 MG PO TABS: 1.0000 | ORAL_TABLET | ORAL | Status: DC | PRN

## 2014-06-15 MED ORDER — PRENATAL PLUS 27-1 MG PO TABS: 1.0000 | ORAL_TABLET | Freq: Every day | ORAL | Status: DC

## 2014-06-15 MED ORDER — ACETAMINOPHEN 325 MG PO TABS: 650.0000 mg | ORAL_TABLET | ORAL | Status: DC | PRN

## 2014-06-15 MED ORDER — LACTATED RINGERS IV SOLN
500.0000 mL | INTRAVENOUS | Status: DC | PRN
  Administered 2014-06-16: 300 mL via INTRAVENOUS

## 2014-06-15 MED ORDER — CITRIC ACID-SODIUM CITRATE 334-500 MG/5ML PO SOLN
30.0000 mL | ORAL | Status: DC | PRN
Start: 2014-06-15 — End: 2014-06-16
  Administered 2014-06-16: 30 mL via ORAL
  Filled 2014-06-15: qty 15

## 2014-06-15 MED ORDER — LIDOCAINE HCL (PF) 1 % IJ SOLN
30.0000 mL | INTRAMUSCULAR | Status: DC | PRN
Start: 2014-06-15 — End: 2014-06-16
  Administered 2014-06-16: 30 mL via SUBCUTANEOUS
  Filled 2014-06-15: qty 30

## 2014-06-15 NOTE — Progress Notes (Signed)
S:  Ctx stronger with shaking and vomiting        Laboring in bathroom on toilet and ctx space out in tub  O:  VS: Blood pressure 116/61, pulse 108, temperature 97 F (36.1 C), temperature source Axillary, resp. rate 20, height 5\' 4"  (1.626 m), weight 80.74 kg (178 lb), last menstrual period 04/30/2013, SpO2 98 %.        FHR : baseline 135 / no audible decelerations        Toco: contractions every 2-5 minutes / moderate          Cervix : deferred at this time         A: active labor with dysfunctional ctx pattern        P: recheck in next hour - recommend pitocin augmentation if no progress - patient prefers no medication if possible   Artelia Laroche CNM, MSN, [facility] 06/15/2014, 9:04 PM

## 2014-06-15 NOTE — MAU Provider Note (Signed)
  History     CSN: [id number]  Arrival date and time: 06/15/14 1023  Nurse call to provider @1106  Provider here to see patient @ 1138    Chief Complaint  Patient presents with  . Labor Eval   HPI  Ctx every 5-6 minutes and stringer in past hour SROM at midnight Planning water birth  Past Medical History  Diagnosis Date  . Anxiety   . Depression   . Active labor 03/21/2011  . Postpartum care following vaginal delivery (9/28) 03/21/2011  . Pneumonia     as teenager  . Headache(784.0)     occ. migraines with periods    Past Surgical History  Procedure Laterality Date  . Dental surgery    . Dilation and evacuation N/A 07/29/2013    Procedure: Ultrasound guided Suction DILATATION AND EVACUATION (D&E) 2ND TRIMESTER with tissue sent for chromosome analysis;  Surgeon: Lovenia Kim, MD;  Location: [facility];  Service: Gynecology;  Laterality: N/A;    Family History  Problem Relation Age of Onset  . Depression Mother   . Depression Father   . Depression Sister   . Depression Brother   . Depression Daughter   . Depression Son     History  Substance Use Topics  . Smoking status: Never Smoker   . Smokeless tobacco: Not on file  . Alcohol Use: Yes     Comment: weekly    Allergies: No Known Allergies  Prescriptions prior to admission  Medication Sig Dispense Refill Last Dose  . docusate sodium (COLACE) 100 MG capsule Take 100 mg by mouth daily as needed for mild constipation.   06/14/2014 at 1800  . Evening Primrose Oil 1000 MG CAPS Take 1,000 mg by mouth 2 (two) times daily.   06/14/2014 at 2100  . OVER THE COUNTER MEDICATION Take 1 capsule by mouth daily. Patient states that she is currently taking Blue Cohosh.  She does not know the dose of the medication.     . prenatal vitamin w/FE, FA (PRENATAL 1 + 1) 27-1 MG TABS Take 1 tablet by mouth daily.     06/14/2014 at 1800  . diphenhydrAMINE (BENADRYL) 25 MG tablet Take 25 mg by mouth at bedtime as needed.   Not Taking at  Unknown time  . traMADol (ULTRAM) 50 MG tablet Take 1-2 tablets (50-100 mg total) by mouth every 6 (six) hours as needed. (Patient not taking: Reported on 06/15/2014) 30 tablet 0 Not Taking at Unknown time    ROS Physical Exam   Blood pressure 124/77, pulse 84, temperature 98.1 F (36.7 C), temperature source Oral, resp. rate 18, last menstrual period 04/30/2013.  Physical Exam Alert and oriented Abdomen soft and non-tender Grossly ruptured - large amount clear fluid Cervix 3cm / 80% / vtx -1  MAU Course  Procedures  Assessment and Plan  39.6 weeks - SROM in latent labor  Admit Expectant management Water immersion when tub available  Artelia Laroche 06/15/2014, 11:37 AM

## 2014-06-15 NOTE — Progress Notes (Signed)
S:  Frustrated with labor - not making any progress - tearful at times       Labored in bathroom / in bed on hands-knees / ambulatory  O:  VS: Blood pressure 69/50, pulse 107, temperature 97.2 F (36.2 C), temperature source Axillary, resp. rate 20, height 5\' 4"  (1.626 m), weight 80.74 kg (178 lb), last menstrual period 04/30/2013, SpO2 98 %.        FHR : baseline 150 / variability moderate / accelerations + / no decelerations        Toco: contractions every 3-6 minutes / moderate         Cervix : deferred       A: protracted  Labor suspect OP with inadequate ctx     FHR category 1 with intermittent EFM  P: discussed pitocin augmentation - patient will use only as last resort       breast pump for nipple stimulation with doula requested                      -  will try for next 30-60 minutes if no progress highly recommend pitocin augmentation   Artelia Laroche CNM, MSN, [facility] 06/15/2014, 11:03 PM

## 2014-06-15 NOTE — Progress Notes (Signed)
S:  Really frustrated - "cant do much more without something happening"  O:  VS: Blood pressure 69/50, pulse 107, temperature 97.2 F (36.2 C), temperature source Axillary, resp. rate 20, height 5\' 4"  (1.626 m), weight 80.74 kg (178 lb), last menstrual period 04/30/2013, SpO2 98 %.        FHR : baseline 140 / variability moderate / accelerations + / no decelerations        Toco: contractions every 3-5 minutes / moderate         Cervix : no change        A: protracted  Labor with inadequate ctx     FHR category 1  P: recommend pitocin - no other option at this point      agrees       start IV bolus LR x 35ml for likely dehydration and start pitocin 44mu and increase by 2 mu until adequate ctx      concerned about pain management - will continue to use doula and staff and midwife support / position changes      cannot get in water with EFM - if rapidly laboring with urge to push could potentially stop pitocin and enter water for birth but will not help pain management    Artelia Laroche CNM, MSN, [facility] 06/15/2014, 11:38 PM

## 2014-06-15 NOTE — H&P (Signed)
OB ADMISSION/ HISTORY & PHYSICAL:  Admission Date: 06/15/2014 10:23 AM  Admit Diagnosis:  39.6 weeks SROM in latent labor  Melanie Ramirez is a 33 y.o. female presenting for SROM at midnight / onset regular ctx around 0400.  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 Anxiety   . Depression   . Active labor 03/21/2011  . Postpartum care following vaginal delivery (9/28) 03/21/2011  . Pneumonia     as teenager  . Headache(784.0)     occ. migraines with periods    Past surgical history:  Past Surgical History  Procedure Laterality Date  . Dental surgery    . Dilation and evacuation N/A 07/29/2013    Procedure: Ultrasound guided Suction DILATATION AND EVACUATION (D&E) 2ND TRIMESTER with tissue sent for chromosome analysis;  Surgeon: Lovenia Kim, MD;  Location: [facility];  Service: Gynecology;  Laterality: N/A;   Family History:  Family History  Problem Relation Age of Onset  . Depression Mother   . Depression Father   . Depression Sister   . Depression Brother   . Depression Daughter   . Depression Son      Social History:  reports that she has never smoked. She does not have any smokeless tobacco history on file. She reports that she drinks alcohol. She reports that she does not use illicit drugs.   Allergies: Review of patient's allergies indicates no known allergies.    Current Medications at time of admission:  Prior to Admission medications   Medication  Sig Start Date End Date Taking? Authorizing Provider  docusate sodium (COLACE) 100 MG capsule Take 100 mg by mouth daily as needed for mild constipation.   Yes Historical Provider, MD  Evening Primrose Oil 1000 MG CAPS Take 1,000 mg by mouth 2 (two) times daily.   Yes Historical Provider, MD  OVER THE COUNTER MEDICATION Take 1 capsule by mouth daily. Patient states that she is currently taking Blue Cohosh.  She does not know the dose of the medication.   Yes Historical Provider, MD  prenatal vitamin w/FE, FA (PRENATAL 1 + 1) 27-1 MG TABS Take 1 tablet by mouth daily.     Yes Historical Provider, MD  diphenhydrAMINE (BENADRYL) 25 MG tablet Take 25 mg by mouth at bedtime as needed.    Historical Provider, MD   Review of Systems: Active FM onset of ctx @ 0400 currently every 6 minutes LOF  / SROM @ midnight bloody show none  Physical Exam:  VS: Blood pressure 124/77, pulse 84, temperature 98.1 F (36.7 C), temperature source Oral, resp. rate 18, last menstrual period 04/30/2013.  General: alert and oriented, appears comfortable - NAD or pain Heart: RRR Lungs: Clear lung fields Abdomen: Gravid, soft and non-tender, non-distended / uterus: gravid Extremities: no edema  Genitalia / VE:  3cm / 80% / vtx/ -1  FHR: baseline rate 135 / variability moderate / accelerations + / no decelerations TOCO: Q4-6  Assessment: [redacted] weeks gestation latent stage of labor FHR category 1   Plan:  Admit Expectant management - desires water birth and limited interventions  Dr Pamala Hurry notified of admission / plan of care   Artelia Laroche CNM, MSN, [facility] 06/15/2014, 11:40 AM

## 2014-06-15 NOTE — Progress Notes (Signed)
S:  Ctx stronger in tub but not feeling painful yet - does not think she has really hit active labor yet       Requests cervix check and options to speed up process that maintain her access to water  O:  VS: Blood pressure 109/63, pulse 92, temperature 98.5 F (36.9 C), temperature source Oral, resp. rate 18, height 5\' 4"  (1.626 m), weight 80.74 kg (178 lb), last menstrual period 04/30/2013.        FHR : baseline 135 with intermittent FHR check        Toco: contractions every 4-5 minutes / moderate         Cervix : same        Membranes: clear fluid - no forewaters palpable        Water immersion x 1 hour / water temp 99.5 on entry and 98.1 on exit  A: latent labor     FHR category 1  P:   recommend cervical balloon to increase natural hormones and advance dilation to 5cm then re-enter tub with increased CTX Activity ad lib Shower - ambulation  Water immersion after balloon out - traction to balloon every hour    Artelia Laroche CNM, MSN, [facility] 06/15/2014, 4:10 PM

## 2014-06-15 NOTE — MAU Note (Signed)
?   SROM @ 3818 last night;

## 2014-06-15 NOTE — Progress Notes (Signed)
S:  Ctx stronger - back in tub       Breathing with ctx       O:  VS: Blood pressure 103/55, pulse 87, temperature 98.1 F (36.7 C), temperature source Axillary, resp. rate 20, height 5\' 4"  (1.626 m), weight 80.74 kg (178 lb), last menstrual period 04/30/2013, SpO2 100 %.        FHR : baseline 140 / variability moderate / accelerations + / no decelerations        Toco: contractions every 4-5 minutes / moderate          Cervix : deferred - balloon out in past 20 minutes / + bloody show        Membranes: clear fluid leakage  A: active labor     FHR category 1  P: anticipate SVB in water in next 2-3 hours   Artelia Laroche CNM, MSN, [facility] 06/15/2014, 6:08 PM

## 2014-06-16 ENCOUNTER — Encounter (HOSPITAL_COMMUNITY): Payer: Self-pay | Admitting: *Deleted

## 2014-06-16 LAB — RPR

## 2014-06-16 MED ORDER — IBUPROFEN 600 MG PO TABS
600.0000 mg | ORAL_TABLET | Freq: Four times a day (QID) | ORAL | Status: DC
  Administered 2014-06-16 – 2014-06-17 (×6): 600 mg via ORAL
  Filled 2014-06-16 (×6): qty 1

## 2014-06-16 MED ORDER — DIBUCAINE 1 % RE OINT: 1.0000 "application " | TOPICAL_OINTMENT | RECTAL | Status: DC | PRN

## 2014-06-16 MED ORDER — DIPHENHYDRAMINE HCL 25 MG PO CAPS: 25.0000 mg | ORAL_CAPSULE | Freq: Four times a day (QID) | ORAL | Status: DC | PRN

## 2014-06-16 MED ORDER — BENZOCAINE-MENTHOL 20-0.5 % EX AERO: 1.0000 "application " | INHALATION_SPRAY | CUTANEOUS | Status: DC | PRN

## 2014-06-16 MED ORDER — SIMETHICONE 80 MG PO CHEW: 80.0000 mg | CHEWABLE_TABLET | ORAL | Status: DC | PRN

## 2014-06-16 MED ORDER — LANOLIN HYDROUS EX OINT: TOPICAL_OINTMENT | CUTANEOUS | Status: DC | PRN

## 2014-06-16 MED ORDER — SENNOSIDES-DOCUSATE SODIUM 8.6-50 MG PO TABS
2.0000 | ORAL_TABLET | ORAL | Status: DC
  Administered 2014-06-17: 2 via ORAL
  Filled 2014-06-16: qty 2

## 2014-06-16 MED ORDER — WITCH HAZEL-GLYCERIN EX PADS: 1.0000 "application " | MEDICATED_PAD | CUTANEOUS | Status: DC | PRN

## 2014-06-16 MED ORDER — NALBUPHINE HCL 10 MG/ML IJ SOLN
10.0000 mg | INTRAMUSCULAR | Status: AC
  Administered 2014-06-16: 10 mg via INTRAVENOUS
  Filled 2014-06-16: qty 1

## 2014-06-16 MED ORDER — OXYCODONE-ACETAMINOPHEN 5-325 MG PO TABS: 2.0000 | ORAL_TABLET | ORAL | Status: DC | PRN

## 2014-06-16 MED ORDER — OXYCODONE-ACETAMINOPHEN 5-325 MG PO TABS: 1.0000 | ORAL_TABLET | ORAL | Status: DC | PRN

## 2014-06-16 NOTE — Plan of Care (Signed)
Problem: Phase II Progression Outcomes Goal: Pushing aids - rope, bar, perineal massage Outcome: Completed/Met Date Met:  06/16/14 Pushed in waterbirth tub

## 2014-06-16 NOTE — Lactation Note (Signed)
This note was copied from the chart of Melanie Schuyler Behan. Lactation Consultation Note  Patient Name: Melanie Ramirez ERDEY'C Date: 06/16/2014 Reason for consult: Initial assessment Baby 16 hours of life. Mom is an experienced BF, 1 year for first child without any issues. Mom reports nursing going very well, baby latches deeply and maintains a deep latch. Baby has nursed within the last hour and is sleeping on mom's chest. Enc mom to offer lots of STS and nurse with cues. Mom given [facility] brochure, aware of OP/BFSG, community resources, and [facility] phone line assistance after D/C.   Maternal Data Has patient been taught Hand Expression?: Yes (Per mom.) Does the patient have breastfeeding experience prior to this delivery?: Yes  Feeding Feeding Type: Breast Fed  LATCH Score/Interventions Latch: Grasps breast easily, tongue down, lips flanged, rhythmical sucking.  Audible Swallowing: A few with stimulation  Type of Nipple: Everted at rest and after stimulation  Comfort (Breast/Nipple): Soft / non-tender     Hold (Positioning): No assistance needed to correctly position infant at breast.  LATCH Score: 9  Lactation Tools Discussed/Used     Consult Status Consult Status: Follow-up Date: 06/17/14 Follow-up type: In-patient    Inocente Salles 06/16/2014, 6:24 PM

## 2014-06-16 NOTE — Progress Notes (Signed)
PPD 0 SVD  S:  Reports feeling -desires DC in AM early             Tolerating po/ No nausea or vomiting             Bleeding is light             Pain controlled with motrin             Up ad lib / ambulatory / voiding QS  Newborn breast feeding   O:               VS: BP 95/53 mmHg  Pulse 78  Temp(Src) 97.9 F (36.6 C) (Axillary)  Resp 18  Ht 5\' 4"  (1.626 m)  Wt 80.74 kg (178 lb)  BMI 30.54 kg/m2  SpO2 99%  LMP 04/30/2013  Breastfeeding? Unknown   LABS:              Recent Labs  06/15/14 1300  WBC 12.0*  HGB 14.2  PLT 168               Blood type: --/--/O POS (12/24 1300)  Rubella: Immune (06/04 0000)                        Physical Exam:             Alert and oriented X3  Abdomen: soft, non-tender, non-distended              Fundus: firm, non-tender, U-1  Perineum: mild edema  Lochia: light  Extremities: no edema, no calf pain or tenderness   A: PPD # 0   Doing well - stable status  P: Routine post partum orders  Anticipate DC in AM  Artelia Laroche CNM, MSN, [facility] 06/16/2014, 11:42 AM

## 2014-06-16 NOTE — Plan of Care (Signed)
Problem: Discharge Progression Outcomes Goal: Voids prior to transfer Outcome: Not Met (add Reason) Patient unable to void at this time. Patient requests more time to void as she was unable to void soon after her last birth as well.

## 2014-06-17 MED ORDER — IBUPROFEN 600 MG PO TABS
600.0000 mg | ORAL_TABLET | Freq: Four times a day (QID) | ORAL | Status: DC
Start: 2014-06-17 — End: 2018-11-30

## 2014-06-17 NOTE — Clinical Social Work Maternal (Signed)
Clinical Social Work Department PSYCHOSOCIAL ASSESSMENT - MATERNAL/CHILD 06/17/2014  Patient:  Melanie Ramirez, Melanie Ramirez  Account Number:  [id number]  [place] Date:  06/15/2014  Ardine Eng Name:   Alesia Banda "Lilly" [facility]    Clinical Social Worker:  Eduard Clos, [place]   Date/Time:  06/17/2014 11:41 AM  Date Referred:  06/16/2014   Referral source  Physician     Referred reason  Depression/Anxiety   Other referral source:    I:  FAMILY / HOME ENVIRONMENT Child's legal guardian:  PARENT  Guardian - Name Guardian - Age Guardian - Address  Narelle Schoening [phone] [place]  same   Other household support members/support persons Name Relationship DOB  Monica Martinez 32yo   Other support:   maternal/fraternal grandparents    II  PSYCHOSOCIAL DATA Information Source:  Patient Interview  Insurance risk surveyor Resources Employment:   Consulting civil engineer resources:  Multimedia programmer If [place]:    School / Grade:  college Music therapist / Industrial/product designer / Early Interventions:  Cultural issues impacting care:    III  STRENGTHS Strengths  Adequate Resources  Home prepared for Child (including basic supplies)  Supportive family/friends   Strength comment:  good support from family, extended family and friends   IV  RISK FACTORS AND CURRENT PROBLEMS Current Problem:  None   Risk Factor & Current Problem Patient Issue Family Issue Risk Factor / Current Problem Comment   N N     V  SOCIAL WORK ASSESSMENT CSW met with MOB at bedside to assess and provide support. MOB has history of anxiety and depression and reports taking Zoloft and Clonazepam in the past to help with this- "when our first child was born I  had a lot of anxiety- we are more prepared this time". She reports good local support as well as developing techniques to better cope including yoga and meditation. "I know better now what signs to watch  for". FOB also is intuned to this and will help to watch for signs/symptoms as they now will have an active toddler in the home as well as a newborn-      [place] Work Plan  No Further Intervention Required / No Barriers to Discharge   Type of pt/family education:   If child protective services report - county:   If child protective services report - date:   Information/referral to community resources comment:   PPD/Feelings after birth brochure provided   Other social work plan:    Eduard Clos, MSW, [employer] Weekend coverage

## 2014-06-17 NOTE — Lactation Note (Signed)
This note was copied from the chart of Melanie Davonna Ertl. Lactation Consultation Note Experienced BF mom wanted early d/c home. States baby BF great, has mild soreness and stated Peds. MD stated baby has a slight tongue tie issue and will follow for any issue later. Baby has had 3 voids and 2 stools at 34 hours of birth. Mom has good everted nipples, no bruising noted. States sees colostrum. Reminded of newborn behavior and BF is slightly different until the baby learns to BF as well. Keep baby close to breast to insure deep latch, chin tug as needed, massaging breast to help express milk during BF, and LC resources, and support groups.  Patient Name: Melanie Ramirez Date: 06/17/2014 Reason for consult: Follow-up assessment   Maternal Data    Feeding Feeding Type: Breast Fed  LATCH Score/Interventions Latch: Grasps breast easily, tongue down, lips flanged, rhythmical sucking.  Audible Swallowing: A few with stimulation  Type of Nipple: Everted at rest and after stimulation  Comfort (Breast/Nipple): Soft / non-tender     Hold (Positioning): No assistance needed to correctly position infant at breast. Intervention(s): Breastfeeding basics reviewed  LATCH Score: 9  Lactation Tools Discussed/Used     Consult Status Consult Status: Complete Date: 06/17/14    Theodoro Kalata 06/17/2014, 12:22 PM

## 2014-06-17 NOTE — Progress Notes (Signed)
PPD #1- SVD  Subjective:   Reports feeling well, desires early discharge Tolerating po/ No nausea or vomiting Bleeding is light Pain controlled with Motrin Up ad lib / ambulatory / voiding without problems Newborn: breastfeeding     Objective:   VS: VS:  Filed Vitals:   06/16/14 0530 06/16/14 0930 06/16/14 1818 06/17/14 0633  BP: 107/68 95/53 119/67 97/49  Pulse: 88 78 74 65  Temp: 98.9 F (37.2 C) 97.9 F (36.6 C) 98.1 F (36.7 C) 98.1 F (36.7 C)  TempSrc: Oral Axillary Oral Oral  Resp: 18 18 18 18   Height:      Weight:      SpO2:    100%    LABS:  Recent Labs  06/15/14 1300  WBC 12.0*  HGB 14.2  PLT 168   Blood type: --/--/O POS (12/24 1300) Rubella: Immune (06/04 0000)                I&O: Intake/Output      12/25 0701 - 12/26 0700 12/26 0701 - 12/27 0700   Blood     Total Output       Net              Physical Exam: Alert and oriented X3 Abdomen: soft, non-tender, non-distended  Fundus: firm, non-tender, U-2 Perineum: Well approximated, no significant erythema, edema, or drainage; healing well. Lochia: small Extremities: No edema, no calf pain or tenderness    Assessment: PPD #1  G3P2012/ S/P:spontaneous vaginal-waterbirth, 1st degree laceration Doing well - stable for discharge home   Plan: Discharge home pending baby discharge RX's:  Ibuprofen 600mg  po Q 6 hrs prn pain #30 Refill x 0 Routine pp visit in [place] given    Julianne Handler, N MSN, CNM 06/17/2014, 11:00 AM

## 2014-06-18 NOTE — Discharge Summary (Signed)
Obstetric Discharge Summary Reason for Admission: onset of labor Prenatal Procedures: ultrasound Intrapartum Procedures: spontaneous vaginal delivery, waterbirth Postpartum Procedures: none Complications-Operative and Postpartum: 1st degree perineal laceration HEMOGLOBIN  Date Value Ref Range Status  06/15/2014 14.2 12.0 - 15.0 g/dL Final   HCT  Date Value Ref Range Status  06/15/2014 39.4 36.0 - 46.0 % Final    Physical Exam:  General: alert and cooperative Lochia: appropriate Uterine Fundus: firm Incision: healing well, no significant drainage, no dehiscence, no significant erythema DVT Evaluation: No evidence of DVT seen on physical exam. Negative Homan's sign. No cords or calf tenderness. No significant calf/ankle edema.  Discharge Diagnoses: Term Pregnancy-delivered  Discharge Information: Date: 06/18/2014 Activity: pelvic rest Diet: routine Medications: PNV and Ibuprofen Condition: stable Instructions: refer to practice specific booklet Discharge to: home Follow-up Information    Follow up with Artelia Laroche, CNM. Schedule an appointment as soon as possible for a visit in 6 weeks.   Specialty:  Obstetrics and Gynecology   Contact information:   [place] [address] [phone]       Newborn Data: Live born female on 06/16/14 Birth Weight: 7 lb 5.5 oz (3331 g) APGAR: 9, 9  Home with mother.  Melanie Ramirez, [place], N 06/18/2014, 12:09 PM

## 2015-05-30 DIAGNOSIS — F419 Anxiety disorder, unspecified: Secondary | ICD-10-CM | POA: Insufficient documentation

## 2015-05-30 DIAGNOSIS — F32A Depression, unspecified: Secondary | ICD-10-CM | POA: Insufficient documentation

## 2015-05-30 DIAGNOSIS — F4320 Adjustment disorder, unspecified: Secondary | ICD-10-CM | POA: Insufficient documentation

## 2015-09-09 DIAGNOSIS — S93401A Sprain of unspecified ligament of right ankle, initial encounter: Secondary | ICD-10-CM | POA: Insufficient documentation

## 2015-11-26 ENCOUNTER — Other Ambulatory Visit: Payer: Self-pay | Admitting: Physician Assistant

## 2015-11-26 ENCOUNTER — Ambulatory Visit
Admission: RE | Admit: 2015-11-26 | Discharge: 2015-11-26 | Disposition: A | Discharge status: Home / Self Care | Payer: BLUE CROSS/BLUE SHIELD | Source: Ambulatory Visit | Attending: Physician Assistant | Admitting: Physician Assistant

## 2015-11-26 DIAGNOSIS — S99912A Unspecified injury of left ankle, initial encounter: Secondary | ICD-10-CM

## 2016-06-11 ENCOUNTER — Ambulatory Visit (INDEPENDENT_AMBULATORY_CARE_PROVIDER_SITE_OTHER): Payer: BLUE CROSS/BLUE SHIELD | Admitting: Podiatry

## 2016-06-11 ENCOUNTER — Ambulatory Visit (INDEPENDENT_AMBULATORY_CARE_PROVIDER_SITE_OTHER): Payer: BLUE CROSS/BLUE SHIELD

## 2016-06-11 ENCOUNTER — Encounter: Payer: Self-pay | Admitting: Podiatry

## 2016-06-11 DIAGNOSIS — M79672 Pain in left foot: Secondary | ICD-10-CM

## 2016-06-11 DIAGNOSIS — R600 Localized edema: Secondary | ICD-10-CM | POA: Diagnosis not present

## 2016-06-11 DIAGNOSIS — S92022A Displaced fracture of anterior process of left calcaneus, initial encounter for closed fracture: Secondary | ICD-10-CM | POA: Diagnosis not present

## 2016-06-11 NOTE — Progress Notes (Signed)
Subjective:  Patient presents today as a referral from [employer] at [facility], for evaluation of left foot pain 6 months. Patient states that 6 months ago she injured her foot while exercising on an elliptical and she is experienced pain and tenderness ever since. Patient presents today for further treatment and evaluation    Objective/Physical Exam General: The patient is alert and oriented x3 in no acute distress.  Dermatology: Skin is warm, dry and supple bilateral lower extremities. Negative for open lesions or macerations.  Vascular: Palpable pedal pulses bilaterally. No edema or erythema noted. Capillary refill within normal limits.  Neurological: Epicritic and protective threshold grossly intact bilaterally.   Musculoskeletal Exam: Pain on palpation noted to the calcaneocuboid joint of the left foot. Moderate edema noted. Range of motion within normal limits to all pedal and ankle joints bilateral. Muscle strength 5/5 in all groups bilateral.   Radiographic Exam:  Avulsion fracture of the anterior process of the calcaneus noted with minimal callus formation. Fracture line is intra-articular into the calcaneal cuboid joint. Fracture fragment is minimally displaced. Fracture displacement is approximately 2 mm  Assessment: #1 calcaneal fracture anterior process #2 chronic pain and edema left foot #3 pain in left foot   Plan of Care:  #1 Patient was evaluated. #2 today we discussed in detail the pathology of fractures and healing. #3 compression anklet dispensed #4 cam boot dispensed #5 authorization for knee scooter today #6 recommend strict nonweightbearing to the left lower extremity 6 weeks #7 return to clinic in 4 weeks for follow-up x-ray   Edrick Kins, DPM [facility]  Dr. Edrick Kins, [address]                                        [place], [place] [address]                Office [phone]  Fax [phone]

## 2016-06-12 ENCOUNTER — Telehealth: Payer: Self-pay | Admitting: *Deleted

## 2016-06-12 DIAGNOSIS — S92002A Unspecified fracture of left calcaneus, initial encounter for closed fracture: Secondary | ICD-10-CM

## 2016-06-12 NOTE — Telephone Encounter (Addendum)
-----   Message from Edrick Kins, DPM sent at 06/11/2016  9:11 AM EST ----- Regarding: Knee scooter Patient needs a knee scooter.  Strict NWB to left lower extremity x 6-8 weeks.   Dx : calcaneal fracture anterior process - left lower extremity  Dr. Amalia Hailey. 06/12/2016-[facility]ed order to [facility] and A. Barnet [place].

## 2016-06-23 HISTORY — PX: FOOT SURGERY: SHX648

## 2016-07-23 ENCOUNTER — Encounter: Payer: Self-pay | Admitting: Podiatry

## 2016-07-23 ENCOUNTER — Ambulatory Visit (INDEPENDENT_AMBULATORY_CARE_PROVIDER_SITE_OTHER): Payer: BLUE CROSS/BLUE SHIELD

## 2016-07-23 ENCOUNTER — Ambulatory Visit (INDEPENDENT_AMBULATORY_CARE_PROVIDER_SITE_OTHER): Payer: BLUE CROSS/BLUE SHIELD | Admitting: Podiatry

## 2016-07-23 VITALS — BP 119/62 | HR 76 | Resp 16

## 2016-07-23 DIAGNOSIS — S92002A Unspecified fracture of left calcaneus, initial encounter for closed fracture: Secondary | ICD-10-CM

## 2016-07-24 NOTE — Progress Notes (Signed)
Subjective:     Patient ID: Melanie Ramirez, female   DOB: 09-05-1980, 36 y.o.   MRN: [id number]  HPI patient states I seem to feel better and for the most part I been nonweightbearing if I do bear weight I have been utilizing my boot   Review of Systems     Objective:   Physical Exam Neurovascular status intact muscle strength adequate range of motion within normal limits with patient found to have mild discomfort over the anterior process calcaneus with it was probable fracture    Assessment:     Hopeful healing of fracture of the anterior process calcaneus    Plan:     Reviewed with patient and gradual reduction in nonweightbearing over the next several weeks but to continue to pursue boot usage with gradual increase in activity starting approximately 2 weeks. Reviewed her x-rays and she'll see Dr. Amalia Hailey in 4 weeks and I did explain if there is a nonunion it we'll probably start hurting again and may require eventual excision  X-ray report indicates that there is what appears to be a healing fracture of the anterior process calcaneus

## 2016-08-20 ENCOUNTER — Ambulatory Visit (INDEPENDENT_AMBULATORY_CARE_PROVIDER_SITE_OTHER): Payer: BLUE CROSS/BLUE SHIELD | Admitting: Podiatry

## 2016-08-20 ENCOUNTER — Ambulatory Visit (INDEPENDENT_AMBULATORY_CARE_PROVIDER_SITE_OTHER): Payer: BLUE CROSS/BLUE SHIELD

## 2016-08-20 DIAGNOSIS — S92025D Nondisplaced fracture of anterior process of left calcaneus, subsequent encounter for fracture with routine healing: Secondary | ICD-10-CM

## 2016-08-20 DIAGNOSIS — S92002D Unspecified fracture of left calcaneus, subsequent encounter for fracture with routine healing: Secondary | ICD-10-CM

## 2016-08-20 NOTE — Progress Notes (Signed)
Subjective:  Patient presents today as a referral from [employer] at [facility]. Patient presents for follow-up evaluation of an calcaneal fracture to the anterior process of the left lower extremity. Patient states that she's feeling much better in the cam boot. Patient is currently weightbearing in the cam boot. Patient last seen 07/23/2016 of the care of Dr. Paulla Dolly. Patient denies pain, tenderness or edema.   Objective/Physical Exam General: The patient is alert and oriented x3 in no acute distress.  Dermatology: Skin is warm, dry and supple bilateral lower extremities. Negative for open lesions or macerations.  Vascular: Palpable pedal pulses bilaterally. No edema or erythema noted. Capillary refill within normal limits.  Neurological: Epicritic and protective threshold grossly intact bilaterally.   Musculoskeletal Exam: Negative for Pain on palpation noted to the calcaneocuboid joint of the left foot. Moderate edema noted. Range of motion within normal limits to all pedal and ankle joints bilateral. Muscle strength 5/5 in all groups bilateral.   Radiographic Exam:  Avulsion fracture of the anterior process of the calcaneus noted with routine healing. Fracture line is intra-articular into the calcaneal cuboid joint. Fracture fragment is minimally displaced. Soft callus formation noted. Assessment: #1 calcaneal fracture anterior process #2 chronic pain and edema left foot #3 pain in left foot   Plan of Care:  #1 Patient was evaluated. #2 today the patient can discontinue the cam boot. Patient has been in cam boot for approximately 10 weeks now. Next line #3 transition out of cam boot into good supportive tennis shoes. Low/non-impact activities when exercising. #3 return to clinic in 4 weeks for follow-up evaluation and radiographic exam  Edrick Kins, DPM [facility]  Dr. Edrick Kins, [address]                                          [place], [place] [address]                Office [phone]  Fax [phone]

## 2016-09-17 ENCOUNTER — Ambulatory Visit (INDEPENDENT_AMBULATORY_CARE_PROVIDER_SITE_OTHER): Payer: BLUE CROSS/BLUE SHIELD

## 2016-09-17 ENCOUNTER — Ambulatory Visit (INDEPENDENT_AMBULATORY_CARE_PROVIDER_SITE_OTHER): Payer: BLUE CROSS/BLUE SHIELD | Admitting: Podiatry

## 2016-09-17 DIAGNOSIS — M79672 Pain in left foot: Secondary | ICD-10-CM

## 2016-09-17 DIAGNOSIS — S92025D Nondisplaced fracture of anterior process of left calcaneus, subsequent encounter for fracture with routine healing: Secondary | ICD-10-CM

## 2016-09-17 NOTE — Progress Notes (Signed)
Subjective:  Patient presents today as a referral from [employer] at [facility]. Patient presents for follow-up evaluation of an calcaneal fracture to the anterior process of the left lower extremity. Patient states she is doing a lot better denies pain with low impact activities.   Objective/Physical Exam General: The patient is alert and oriented x3 in no acute distress.  Dermatology: Skin is warm, dry and supple bilateral lower extremities. Negative for open lesions or macerations.  Vascular: Palpable pedal pulses bilaterally. No edema or erythema noted. Capillary refill within normal limits.  Neurological: Epicritic and protective threshold grossly intact bilaterally.   Musculoskeletal Exam: Negative for Pain on palpation noted to the calcaneocuboid joint of the left foot. Moderate edema noted. Range of motion within normal limits to all pedal and ankle joints bilateral. Muscle strength 5/5 in all groups bilateral.   Radiographic Exam:  Avulsion fracture of the anterior process of the calcaneus noted with routine healing. Fracture line is intra-articular into the calcaneal cuboid joint. Fracture fragment is minimally displaced. It appears that the fracture fragment is stable  And has possibly reached maximum union.   Assessment: #1 calcaneal fracture anterior process #2 chronic pain and edema left foot #3 pain in left foot  Plan of Care:  #1 Patient was evaluated. #2 patient can slowly begin to transition to full activity no restrictions. I did explain to the patient that the fracture fragment stable and mostly healed however there is a chance that over time, if the fracture fragment becomes symptomatic again we may need surgery to remove the fragment. #3 return to clinic when necessary  Edrick Kins, DPM [facility]  Dr. Edrick Kins, [address]                                        [place], [place] [address]                Office [phone]  Fax [phone]

## 2017-01-12 ENCOUNTER — Encounter: Payer: Self-pay | Admitting: Podiatry

## 2017-01-12 ENCOUNTER — Ambulatory Visit (INDEPENDENT_AMBULATORY_CARE_PROVIDER_SITE_OTHER): Payer: BLUE CROSS/BLUE SHIELD

## 2017-01-12 ENCOUNTER — Ambulatory Visit (INDEPENDENT_AMBULATORY_CARE_PROVIDER_SITE_OTHER): Payer: BLUE CROSS/BLUE SHIELD | Admitting: Podiatry

## 2017-01-12 DIAGNOSIS — S92025K Nondisplaced fracture of anterior process of left calcaneus, subsequent encounter for fracture with nonunion: Secondary | ICD-10-CM

## 2017-01-12 DIAGNOSIS — M79672 Pain in left foot: Secondary | ICD-10-CM | POA: Diagnosis not present

## 2017-01-12 NOTE — Progress Notes (Signed)
   HPI: 36 year old otherwise healthy female presents today for reactivation of a nonunion fracture to the anterior process of the calcaneus left lower extremity. Patient was last seen 09/17/2016 at which time she was able to return to activity full activity with no restrictions. Patient was doing excellent until approximately one week ago when she noticed pain and tenderness to the anterior process of the calcaneus again. Patient has had this aggravating injury for over a year now and conservative modalities have been unsuccessful in providing any sort of permanent satisfactory alleviation of symptoms for the patient. Patient presents today for further treatment and evaluation and to discuss possible surgical intervention   Physical Exam: General: The patient is alert and oriented x3 in no acute distress.  Dermatology: Skin is warm, dry and supple bilateral lower extremities. Negative for open lesions or macerations.  Vascular: Palpable pedal pulses bilaterally. No edema or erythema noted. Capillary refill within normal limits.  Neurological: Epicritic and protective threshold grossly intact bilaterally.   Musculoskeletal Exam: Pain on palpation overlying the anterior process of the calcaneus left lower extremity. Range of motion within normal limits to all pedal and ankle joints bilateral. Muscle strength 5/5 in all groups bilateral.   Radiographic Exam:  Nonunion noted to the anterior process of the calcaneus. The fracture fragment appears well circumscribed and very chronic in nature.   Assessment: 1. 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 Patient was evaluated. X-rays reviewed today  2. Today we discussed additional conservative versus surgical management of the anterior process fracture. Patient opts for surgical management. All possible complications and details the procedure were explained. All patient questions were answered. No guarantees  were expressed or implied. 3. Authorization for surgery initiated today. Surgery will consist of exostectomy anterior process of the calcaneus left lower extremity. 4. Return to clinic 1 week postop   Edrick Kins, DPM [facility]  Dr. Edrick Kins, DPM    [address], [place] [address]                Office [phone]  Fax [phone]

## 2017-01-12 NOTE — Patient Instructions (Signed)

## 2017-02-19 ENCOUNTER — Encounter: Payer: Self-pay | Admitting: Podiatry

## 2017-02-19 DIAGNOSIS — M7732 Calcaneal spur, left foot: Secondary | ICD-10-CM | POA: Diagnosis not present

## 2017-02-20 ENCOUNTER — Telehealth: Payer: Self-pay | Admitting: *Deleted

## 2017-02-20 NOTE — Telephone Encounter (Signed)
Pt asked if she could shower, or just needed to wash with a wash cloth, so as not to get surgical dressing wet. I told pt to cover surgical foot and leg with towel, then bag and duct tape bag snuggly, and have foot out of the shower if possible.

## 2017-02-24 NOTE — Progress Notes (Signed)
DOS 02/19/17 Exostectomy Anterior process Calcaneal Lt

## 2017-02-25 ENCOUNTER — Ambulatory Visit (INDEPENDENT_AMBULATORY_CARE_PROVIDER_SITE_OTHER): Payer: BLUE CROSS/BLUE SHIELD

## 2017-02-25 ENCOUNTER — Encounter: Payer: Self-pay | Admitting: Podiatry

## 2017-02-25 ENCOUNTER — Ambulatory Visit (INDEPENDENT_AMBULATORY_CARE_PROVIDER_SITE_OTHER): Payer: Self-pay | Admitting: Podiatry

## 2017-02-25 VITALS — BP 111/66 | HR 66 | Temp 96.3°F

## 2017-02-25 DIAGNOSIS — Z9889 Other specified postprocedural states: Secondary | ICD-10-CM

## 2017-03-01 NOTE — Progress Notes (Signed)
   Subjective:  Patient presents today status post left calcaneal exostectomy. DOS: 02/19/17. Pt states she is doing well and has no complaints at this time. She is here for further evaluation and treatment.     Objective/Physical Exam Skin incisions appear to be well coapted with sutures and staples intact. No sign of infectious process noted. No dehiscence. No active bleeding noted. Moderate edema noted to the surgical extremity.  Radiographic Exam:  Osteotomies sites appear to be stable with routine healing.  Assessment: 1. s/p left calcaneal exostectomy. DOS: 02/19/17.   Plan of Care:  1. Patient was evaluated. X-rays reviewed 2. Dressing changed. 3. Continue wearing CAM boot.  4. Return to clinic in 1 week for staple removal.   Edrick Kins, DPM [facility]  Dr. Edrick Kins, [address] [place]                                        [place],  [address]                Office [phone]  Fax [phone]

## 2017-03-03 ENCOUNTER — Ambulatory Visit (INDEPENDENT_AMBULATORY_CARE_PROVIDER_SITE_OTHER): Payer: Self-pay | Admitting: Podiatry

## 2017-03-03 ENCOUNTER — Encounter: Payer: Self-pay | Admitting: Podiatry

## 2017-03-03 DIAGNOSIS — Z9889 Other specified postprocedural states: Secondary | ICD-10-CM

## 2017-03-04 ENCOUNTER — Encounter: Payer: BLUE CROSS/BLUE SHIELD | Admitting: Podiatry

## 2017-03-06 NOTE — Progress Notes (Signed)
   Subjective:  Patient presents today status post left calcaneal exostectomy. DOS: 02/19/17. Pt states she is doing well and has no complaints at this time. She is here for further evaluation and treatment.     Objective/Physical Exam Skin incisions appear to be well coapted with sutures and staples intact. No sign of infectious process noted. No dehiscence. No active bleeding noted. Moderate edema noted to the surgical extremity.  Radiographic Exam:  Osteotomies sites appear to be stable with routine healing.  Assessment: 1. s/p left calcaneal exostectomy. DOS: 02/19/17.   Plan of Care:  1. Patient was evaluated.  2. Staples removed. 3. Dry sterile dressing changed.  4. Continue wearing CAM boot; weightbearing as tolerated.  5. Return to clinic in two weeks.   Edrick Kins, DPM [facility]  Dr. Edrick Kins, [address]                                        [place], [place] [address]                Office [phone]  Fax [phone]

## 2017-03-18 ENCOUNTER — Ambulatory Visit (INDEPENDENT_AMBULATORY_CARE_PROVIDER_SITE_OTHER): Payer: BLUE CROSS/BLUE SHIELD | Admitting: Podiatry

## 2017-03-18 ENCOUNTER — Ambulatory Visit (INDEPENDENT_AMBULATORY_CARE_PROVIDER_SITE_OTHER): Payer: BLUE CROSS/BLUE SHIELD

## 2017-03-18 DIAGNOSIS — S92025D Nondisplaced fracture of anterior process of left calcaneus, subsequent encounter for fracture with routine healing: Secondary | ICD-10-CM

## 2017-03-18 DIAGNOSIS — Z9889 Other specified postprocedural states: Secondary | ICD-10-CM

## 2017-03-19 NOTE — Progress Notes (Signed)
   Subjective:  Patient presents today status post left calcaneal exostectomy. DOS: 02/19/17. Pt states she is doing very well and has no complaints at this time. She states she is ready to discontinue wearing the CAM boot. She is here for further evaluation and treatment.    Past Medical History:  Diagnosis Date  . Active labor 03/21/2011  . Anxiety   . Depression   . Headache(784.0)    occ. migraines with periods  . Pneumonia    as teenager  . Postpartum care following vaginal delivery (9/28) 03/21/2011     Objective/Physical Exam Skin incisions appear to be well coapted. No sign of infectious process noted. No dehiscence. No active bleeding noted. Incision healed.    Radiographic Exam:  Osteotomies sites appear to be stable with routine healing.  Assessment: 1. s/p left calcaneal exostectomy. DOS: 02/19/17.   Plan of Care:  1. Patient was evaluated. X-Rays reviewed. 2. Discontinue wearing CAM boot. 3. Ankle brace dispensed.  4. Begin walking in sneaker and ankle braces.  5. Return to clinic in 4 weeks.   Edrick Kins, DPM [facility]  Dr. Edrick Kins, [address]                                        [place], [place] [address]                Office [phone]  Fax [phone]

## 2017-04-15 ENCOUNTER — Ambulatory Visit (INDEPENDENT_AMBULATORY_CARE_PROVIDER_SITE_OTHER): Payer: BLUE CROSS/BLUE SHIELD

## 2017-04-15 ENCOUNTER — Ambulatory Visit (INDEPENDENT_AMBULATORY_CARE_PROVIDER_SITE_OTHER): Payer: Self-pay | Admitting: Podiatry

## 2017-04-15 DIAGNOSIS — S92025D Nondisplaced fracture of anterior process of left calcaneus, subsequent encounter for fracture with routine healing: Secondary | ICD-10-CM

## 2017-04-15 DIAGNOSIS — Z9889 Other specified postprocedural states: Secondary | ICD-10-CM

## 2017-04-18 NOTE — Progress Notes (Signed)
   Subjective:  Patient presents today status post left calcaneal exostectomy. DOS: 02/19/17. She states she is overall doing well. Wearing shoes helps alleviate any pain she may have. She has no new complaints at this time. She is here for further evaluation and treatment.    Past Medical History:  Diagnosis Date  . Active labor 03/21/2011  . Anxiety   . Depression   . Headache(784.0)    occ. migraines with periods  . Pneumonia    as teenager  . Postpartum care following vaginal delivery (9/28) 03/21/2011     Objective/Physical Exam Skin incisions appear to be well coapted. No sign of infectious process noted. No dehiscence. No active bleeding noted. Incision healed.    Radiographic Exam:  Osteotomies sites appear to be stable with routine healing.  Assessment: 1. s/p left calcaneal exostectomy. DOS: 02/19/17.   Plan of Care:  1. Patient was evaluated. X-Rays reviewed. 2. Resume full activity with no restrictions. 3. Slowly return to jogging, exercising. 4. Return to clinic when necessary.   Edrick Kins, DPM [facility]  Dr. Edrick Kins, [address]                                        [place], [place] [address]                Office [phone]  Fax [phone]

## 2018-04-12 ENCOUNTER — Ambulatory Visit: Payer: Self-pay | Admitting: Surgery

## 2018-04-12 DIAGNOSIS — K439 Ventral hernia without obstruction or gangrene: Secondary | ICD-10-CM

## 2018-04-12 DIAGNOSIS — K429 Umbilical hernia without obstruction or gangrene: Secondary | ICD-10-CM

## 2018-04-12 NOTE — H&P (Signed)
[place] Documented: 04/12/2018 8:35 AM Location: [facility] Patient #: [id number] DOB: March 20, 1981 Married / Language: Cleophus Molt / Race: White Female  History of Present Illness Adin Hector MD; 04/12/2018 9:12 AM) The patient is a 37 year old female who presents with an umbilical hernia. 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 She's noticed that it is out all the time now and somewhat stuck. Occasionally sensitive. Bothering her. She's not planning to have anymore children. Husband about vasectomy. She is interested in considering getting repaired since it seems to be being occasionally bothersome. She's never had any abdominal surgery. She was he moves her bowels every days. She does not smoke. No C-sections. 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 No history of MRSA or infections. No diabetes. No cardiac nor pulmonary issues  (Review of systems as stated in this history (HPI) or in the review of systems. Otherwise all other 12 point ROS are negative) ` ` `   Past Surgical History Illene Regulus, CMA; 04/12/2018 8:46 AM) Foot Surgery Left. Oral Surgery  Diagnostic Studies History Illene Regulus, CMA; 04/12/2018 8:46 AM) Colonoscopy never  Allergies Lars Mage Spillers, CMA; 04/12/2018 8:36 AM) No Known Drug Allergies [04/12/2018]:  Medication History (Alisha Spillers, CMA; 04/12/2018 8:38 AM) buPROPion HCl ER (XL) (150MG  Tablet ER 24HR, Oral) Active. Citalopram Hydrobromide (20MG  Tablet, Oral) Active. Medications Reconciled  Social History Illene Regulus, CMA; 04/12/2018 8:46 AM) Tobacco use Never  smoker.  Family History Illene Regulus, [place]; 04/12/2018 8:46 AM) Arthritis Mother. Depression Mother. Migraine Headache Mother, Sister.  Pregnancy / Birth History Illene Regulus, CMA; 04/12/2018 8:46 AM) Contraceptive History Oral contraceptives. Length (months) of breastfeeding 12-24  Other Problems Lars Mage Spillers, CMA; 04/12/2018 8:46 AM) Anxiety Disorder Depression Umbilical Hernia Repair     Review of Systems (Alisha Spillers CMA; 04/12/2018 8:46 AM) General Not Present- Appetite Loss, Chills, Fatigue, Fever, Night Sweats, Weight Gain and Weight Loss. Skin Not Present- Change in Wart/Mole, Dryness, Hives, Jaundice, New Lesions, Non-Healing Wounds, Rash and Ulcer. HEENT Present- Seasonal Allergies. Not Present- Earache, Hearing Loss, Hoarseness, Nose Bleed, Oral Ulcers, Ringing in the Ears, Sinus Pain, Sore Throat, Visual Disturbances, Wears glasses/contact lenses and Yellow Eyes. Respiratory Not Present- Bloody sputum, Chronic Cough, Difficulty Breathing, Snoring and Wheezing. Breast Not Present- Breast Mass, Breast Pain, Nipple Discharge and Skin Changes. Gastrointestinal Present- Bloating. Not Present- Abdominal Pain, Bloody Stool, Change in Bowel Habits, Chronic diarrhea, Constipation, Difficulty Swallowing, Excessive gas, Gets full quickly at meals, Hemorrhoids, Indigestion, Nausea, Rectal Pain and Vomiting. Female Genitourinary Not Present- Frequency, Nocturia, Painful Urination, Pelvic Pain and Urgency. Endocrine Not Present- Cold Intolerance, Excessive Hunger, Hair Changes, Heat Intolerance, Hot flashes and New Diabetes. Hematology Not Present- Blood Thinners, Easy Bruising, Excessive bleeding, Gland problems, HIV and Persistent Infections.  Vitals (Alisha Spillers CMA; 04/12/2018 8:36 AM) 04/12/2018 8:36 AM Weight: 162 lb Height: 65in Body Surface Area: 1.81 m Body Mass Index: 26.96 kg/m  Pulse: 74 (Regular)  BP: 104/68 (Sitting, Left Arm,  Standard)      Physical Exam Adin Hector MD; 04/12/2018 9:09 AM)  General Mental Status-Alert. General Appearance-Not in acute distress, Not Sickly. Orientation-Oriented X3. Hydration-Well hydrated. Voice-Normal.  Integumentary Global Assessment Upon  inspection and palpation of skin surfaces of the - Axillae: non-tender, no inflammation or ulceration, no drainage. and Distribution of scalp and body hair is normal. General Characteristics Temperature - normal warmth is noted.  Head and Neck Head-normocephalic, atraumatic with no lesions or palpable masses. Face Global Assessment - atraumatic, no absence of expression. Neck Global Assessment - no abnormal movements, no bruit auscultated on the right, no bruit auscultated on the left, no decreased range of motion, non-tender. Trachea-midline. Thyroid Gland Characteristics - non-tender.  Eye Eyeball - Left-Extraocular movements intact, No Nystagmus. Eyeball - Right-Extraocular movements intact, No Nystagmus. Cornea - Left-No Hazy. Cornea - Right-No Hazy. Sclera/Conjunctiva - Left-No scleral icterus, No Discharge. Sclera/Conjunctiva - Right-No scleral icterus, No Discharge. Pupil - Left-Direct reaction to light normal. Pupil - Right-Direct reaction to light normal.  ENMT Ears Pinna - Left - no drainage observed, no generalized tenderness observed. Right - no drainage observed, no generalized tenderness observed. Nose and Sinuses External Inspection of the Nose - no destructive lesion observed. Inspection of the nares - Left - quiet respiration. Right - quiet respiration. Mouth and Throat Lips - Upper Lip - no fissures observed, no pallor noted. Lower Lip - no fissures observed, no pallor noted. Nasopharynx - no discharge present. Oral Cavity/Oropharynx - Tongue - no dryness observed. Oral Mucosa - no cyanosis observed. Hypopharynx - no evidence of airway distress observed.  Chest and Lung  Exam Inspection Movements - Normal and Symmetrical. Accessory muscles - No use of accessory muscles in breathing. Palpation Palpation of the chest reveals - Non-tender. Auscultation Breath sounds - Normal and Clear.  Cardiovascular Auscultation Rhythm - Regular. Murmurs & Other Heart Sounds - Auscultation of the heart reveals - No Murmurs and No Systolic Clicks.  Abdomen Inspection Inspection of the abdomen reveals - No Visible peristalsis and No Abnormal pulsations. Umbilicus - No Bleeding, No Urine drainage. Palpation/Percussion Palpation and Percussion of the abdomen reveal - Soft, Non Tender, No Rebound tenderness, No Rigidity (guarding) and No Cutaneous hyperesthesia. Note: 3 cm supraumbilical mass partially reducible sensitive. Separate umbilical hernia only at base of stalk.  Abdomen soft. Not distended. No distasis recti.   Female Genitourinary Sexual Maturity Tanner 5 - Adult hair pattern. Note: No vaginal bleeding nor discharge  Peripheral Vascular Upper Extremity Inspection - Left - No Cyanotic nailbeds, Not Ischemic. Right - No Cyanotic nailbeds, Not Ischemic.  Neurologic Neurologic evaluation reveals -normal attention span and ability to concentrate, able to name objects and repeat phrases. Appropriate fund of knowledge , normal sensation and normal coordination. Mental Status Affect - not angry, not paranoid. Cranial Nerves-Normal Bilaterally. Gait-Normal.  Neuropsychiatric Mental status exam performed with findings of-able to articulate well with normal speech/language, rate, volume and coherence, thought content normal with ability to perform basic computations and apply abstract reasoning and no evidence of hallucinations, delusions, obsessions or homicidal/suicidal ideation.  Musculoskeletal Global Assessment Spine, Ribs and Pelvis - no instability, subluxation or laxity. Right Upper Extremity - no instability, subluxation or  laxity.  Lymphatic Head & Neck  General Head & Neck Lymphatics: Bilateral - Description - No Localized lymphadenopathy. Axillary  General Axillary Region: Bilateral - Description - No Localized lymphadenopathy. Femoral & Inguinal  Generalized Femoral & Inguinal Lymphatics: Left - Description - No Localized lymphadenopathy. Right - Description - No Localized lymphadenopathy.    Assessment & Plan Adin Hector MD; 04/12/2018 9:25 AM)  EPIGASTRIC HERNIA (K43.9) Impression: Partially incarcerated hernia just above the bellybutton. Not fully reducible. Umbilical hernia as well.  I  think she would benefit from diagnostic laparoscopy to help map out the region of hernias. Given her activity level and young age and multiple locations, I think mesh underlay repair would be a good idea to lower her recurrence. She is interested in proceeding.  She is up to do this after the holiday season in January when she can focus on recovery. She does have a job with moderate activity and a lot of plane travel. I did caution her against going back to work right away. Most likely 2 weeks for light duty & 6 weeks fully unrestricted. Guarded against recommending her flying for the first 3 weeks   UMBILICAL HERNIA WITHOUT OBSTRUCTION AND WITHOUT GANGRENE (K42.9) Impression: Small hernia within the umbilical stalk. Would repair at the time of the larger supraumbilical hernia   PREOP - [place] - ENCOUNTER FOR PREOPERATIVE EXAMINATION FOR GENERAL SURGICAL PROCEDURE (Z01.818)  Current Plans You are being scheduled for surgery- Our schedulers will call you.  You should hear from our office's scheduling department within 5 working days about the location, date, and time of surgery. We try to make accommodations for patient's preferences in scheduling surgery, but sometimes the OR schedule or the surgeon's schedule prevents [place] from making those accommodations.  If you have not heard from our office  [phone]) in 5 working days, call the office and ask for your surgeon's nurse.  If you have other questions about your diagnosis, plan, or surgery, call the office and ask for your surgeon's nurse.  Written instructions provided The anatomy & physiology of the abdominal wall was discussed. The pathophysiology of hernias was discussed. Natural history risks without surgery including progeressive enlargement, pain, incarceration, & strangulation was discussed. Contributors to complications such as smoking, obesity, diabetes, prior surgery, etc were discussed.  I feel the risks of no intervention will lead to serious problems that outweigh the operative risks; therefore, I recommended surgery to reduce and repair the hernia. I explained laparoscopic techniques with possible need for an open approach. I noted the probable use of mesh to patch and/or buttress the hernia repair  Risks such as bleeding, infection, abscess, need for further treatment, heart attack, death, and other risks were discussed. I noted a good likelihood this will help address the problem. Goals of post-operative recovery were discussed as well. Possibility that this will not correct all symptoms was explained. I stressed the importance of low-impact activity, aggressive pain control, avoiding constipation, & not pushing through pain to minimize risk of post-operative chronic pain or injury. Possibility of reherniation especially with smoking, obesity, diabetes, immunosuppression, and other health conditions was discussed. We will work to minimize complications.  An educational handout further explaining the pathology & treatment options was given as well. Questions were answered. The patient expresses understanding & wishes to proceed with surgery.  Pt Education - [facility] Hernia Post-Op HCI (Jayce Boyko): discussed with patient and provided information. Pt Education - [facility] Pain Control (Yasmen Cortner) Pt Education - Pamphlet Given  - Laparoscopic Hernia Repair: discussed with patient and provided information. Pt Education - [facility] Mesh education: discussed with patient and provided information.  Adin Hector, MD, FACS, MASCRS Gastrointestinal [facility] Minimally Invasive Surgery    [address] [place], [place] [address] [phone] Main / Paging [phone] Fax

## 2018-08-25 ENCOUNTER — Ambulatory Visit: Payer: Self-pay | Admitting: Surgery

## 2018-10-27 ENCOUNTER — Ambulatory Visit: Admit: 2018-10-27 | Payer: BLUE CROSS/BLUE SHIELD | Admitting: Surgery

## 2018-10-27 SURGERY — REPAIR, HERNIA, VENTRAL, LAPAROSCOPIC
Anesthesia: General

## 2018-11-01 ENCOUNTER — Ambulatory Visit: Payer: Self-pay | Admitting: Surgery

## 2018-12-02 NOTE — Patient Instructions (Addendum)
Melanie Ramirez    Your procedure is scheduled on: 12-08-2018   Report to [facility] Main  Entrance    Report to admitting at 1145 AM   YOU NEED TO HAVE A COVID 19 TEST ON 12-04-18 @ 11:45 AM THIS TEST MUST BE DONE BEFORE SURGERY, COME TO [place]. ONCE YOU COMPLETE YOUR TEST, PLEASE BEGIN THE QUARANTINE INSTRUCTIONS AS OUTLINED IN YOUR HANDOUT.   Call this number if you have problems the morning of surgery [phone]    Remember: Do not eat food or drink :After Midnight CLEAR LIQUIDS FROM MIDNIGHT UNTIL 7:45 AM.     BRUSH YOUR TEETH MORNING OF SURGERY AND RINSE YOUR MOUTH OUT, NO CHEWING GUM CANDY OR MINTS.     CLEAR LIQUID DIET   Foods Allowed                                                                     Foods Excluded  Coffee and tea, regular and decaf                             liquids that you cannot  Plain Jell-O in any flavor                                             see through such as: Fruit ices (not with fruit pulp)                                     milk, soups, orange juice  Iced Popsicles                                    All solid food Carbonated beverages, regular and diet                                    Cranberry, grape and apple juices Sports drinks like Gatorade Lightly seasoned clear broth or consume(fat free) Sugar, honey syrup  Sample Menu Breakfast                                Lunch                                     Supper Cranberry juice                    Beef broth                            Chicken broth Jell-O  Grape juice                           Apple juice Coffee or tea                        Jell-O                                      Popsicle                                                Coffee or tea                        Coffee or tea  _____________________________________________________________________     Take these  medicines the morning of surgery with A SIP OF WATER: BUPROPION (WELLBUTRIN), CITALOPRAM (CELEXA)                                You may not have any metal on your body including hair pins and              piercings  Do not wear jewelry, make-up, lotions, powders or perfumes, deodorant             Do not wear nail polish.  Do not shave  48 hours prior to surgery.               Do not bring valuables to the hospital. [place].  Contacts, dentures or bridgework may not be worn into surgery.      DRIVER HOME AND PHONE NUMBER: Melanie Ramirez [phone]   YOU MAY NOT [place] BY UBER OR TAXI YOU MUST HAVE A RESPONSIBLE ADULT TO DRIVER YOU HOME              Please read over the following fact sheets you were given: _____________________________________________________________________             [facility] - Preparing for Surgery Before surgery, you can play an important role.  Because skin is not sterile, your skin needs to be as free of germs as possible.  You can reduce the number of germs on your skin by washing with CHG (chlorahexidine gluconate) soap before surgery.  CHG is an antiseptic cleaner which kills germs and bonds with the skin to continue killing germs even after washing. Please DO NOT use if you have an allergy to CHG or antibacterial soaps.  If your skin becomes reddened/irritated stop using the CHG and inform your nurse when you arrive at Short Stay. Do not shave (including legs and underarms) for at least 48 hours prior to the first CHG shower.  You may shave your face/neck. Please follow these instructions carefully:  1.  Shower with CHG Soap the night before surgery and the  morning of Surgery.  2.  If you choose to wash your hair, wash your hair first as usual with your  normal  shampoo.  3.  After you shampoo, rinse your hair and body thoroughly to  remove the  shampoo.                           4.  Use CHG as you would  any other liquid soap.  You can apply chg directly  to the skin and wash                       Gently with a scrungie or clean washcloth.  5.  Apply the CHG Soap to your body ONLY FROM THE NECK DOWN.   Do not use on face/ open                           Wound or open sores. Avoid contact with eyes, ears mouth and genitals (private parts).                       Wash face,  Genitals (private parts) with your normal soap.             6.  Wash thoroughly, paying special attention to the area where your surgery  will be performed.  7.  Thoroughly rinse your body with warm water from the neck down.  8.  DO NOT shower/wash with your normal soap after using and rinsing off  the CHG Soap.                9.  Pat yourself dry with a clean towel.            10.  Wear clean pajamas.            11.  Place clean sheets on your bed the night of your first shower and do not  sleep with pets. Day of Surgery : Do not apply any lotions/deodorants the morning of surgery.  Please wear clean clothes to the hospital/surgery center.  FAILURE TO FOLLOW THESE INSTRUCTIONS MAY RESULT IN THE CANCELLATION OF YOUR SURGERY PATIENT SIGNATURE_________________________________  NURSE SIGNATURE__________________________________  ________________________________________________________________________

## 2018-12-03 ENCOUNTER — Encounter (HOSPITAL_COMMUNITY)
Admission: RE | Admit: 2018-12-03 | Discharge: 2018-12-03 | Disposition: A | Discharge status: Home / Self Care | Payer: BC Managed Care – PPO | Source: Ambulatory Visit | Attending: Surgery | Admitting: Surgery

## 2018-12-03 ENCOUNTER — Encounter (INDEPENDENT_AMBULATORY_CARE_PROVIDER_SITE_OTHER): Payer: Self-pay

## 2018-12-03 ENCOUNTER — Other Ambulatory Visit: Payer: Self-pay

## 2018-12-03 ENCOUNTER — Encounter (HOSPITAL_COMMUNITY): Payer: Self-pay

## 2018-12-03 DIAGNOSIS — Z01818 Encounter for other preprocedural examination: Secondary | ICD-10-CM | POA: Diagnosis present

## 2018-12-03 LAB — CBC
HCT: 41.3 % (ref 36.0–46.0)
Hemoglobin: 13.9 g/dL (ref 12.0–15.0)
MCH: 31.4 pg (ref 26.0–34.0)
MCHC: 33.7 g/dL (ref 30.0–36.0)
MCV: 93.2 fL (ref 80.0–100.0)
Platelets: 254 10*3/uL (ref 150–400)
RBC: 4.43 MIL/uL (ref 3.87–5.11)
RDW: 12.3 % (ref 11.5–15.5)
WBC: 6.4 10*3/uL (ref 4.0–10.5)
nRBC: 0 % (ref 0.0–0.2)

## 2018-12-03 LAB — HCG, SERUM, QUALITATIVE: Preg, Serum: NEGATIVE

## 2018-12-04 ENCOUNTER — Other Ambulatory Visit (HOSPITAL_COMMUNITY)
Admission: RE | Admit: 2018-12-04 | Discharge: 2018-12-04 | Disposition: A | Discharge status: Home / Self Care | Payer: BC Managed Care – PPO | Source: Ambulatory Visit | Attending: Surgery | Admitting: Surgery

## 2018-12-04 DIAGNOSIS — Z1159 Encounter for screening for other viral diseases: Secondary | ICD-10-CM | POA: Diagnosis not present

## 2018-12-06 LAB — NOVEL CORONAVIRUS, NAA (HOSP ORDER, SEND-OUT TO REF LAB; TAT 18-24 HRS): SARS-CoV-2, NAA: NOT DETECTED

## 2018-12-07 MED ORDER — BUPIVACAINE LIPOSOME 1.3 % IJ SUSP
20.0000 mL | Freq: Once | INTRAMUSCULAR | Status: DC
  Filled 2018-12-07: qty 20

## 2018-12-07 NOTE — Progress Notes (Signed)
SPOKE W/ patient via phone.      SCREENING SYMPTOMS OF COVID 19:     COUGH--no   RUNNY NOSE---no    SORE THROAT---no   NASAL CONGESTION----no   SNEEZING---no   SHORTNESS OF BREATH---no   DIFFICULTY BREATHING---no   TEMP >100.0 -----no   UNEXPLAINED BODY ACHES------no   CHILLS --------no    HEADACHES ---------no   LOSS OF SMELL/ TASTE --------no       HAVE YOU OR ANY FAMILY MEMBER TRAVELLED PAST 14 DAYS OUT OF THE    COUNTY---no STATE----no COUNTRY----no   HAVE YOU OR ANY FAMILY MEMBER BEEN EXPOSED TO ANYONE WITH COVID 19?    no 

## 2018-12-08 ENCOUNTER — Ambulatory Visit (HOSPITAL_COMMUNITY): Payer: BC Managed Care – PPO | Admitting: Physician Assistant

## 2018-12-08 ENCOUNTER — Encounter (HOSPITAL_COMMUNITY): Payer: Self-pay | Admitting: *Deleted

## 2018-12-08 ENCOUNTER — Ambulatory Visit (HOSPITAL_COMMUNITY)
Admission: RE | Admit: 2018-12-08 | Discharge: 2018-12-08 | Disposition: A | Discharge status: Home / Self Care | Payer: BC Managed Care – PPO | Source: Other Acute Inpatient Hospital | Attending: Surgery | Admitting: Surgery

## 2018-12-08 ENCOUNTER — Ambulatory Visit (HOSPITAL_COMMUNITY): Payer: BC Managed Care – PPO | Admitting: Certified Registered"

## 2018-12-08 ENCOUNTER — Encounter (HOSPITAL_COMMUNITY): Admission: RE | Disposition: A | Discharge status: Home / Self Care | Payer: Self-pay | Attending: Surgery

## 2018-12-08 DIAGNOSIS — F419 Anxiety disorder, unspecified: Secondary | ICD-10-CM | POA: Insufficient documentation

## 2018-12-08 DIAGNOSIS — K429 Umbilical hernia without obstruction or gangrene: Secondary | ICD-10-CM | POA: Diagnosis present

## 2018-12-08 DIAGNOSIS — Z79899 Other long term (current) drug therapy: Secondary | ICD-10-CM | POA: Insufficient documentation

## 2018-12-08 DIAGNOSIS — G43909 Migraine, unspecified, not intractable, without status migrainosus: Secondary | ICD-10-CM | POA: Diagnosis not present

## 2018-12-08 DIAGNOSIS — Z8261 Family history of arthritis: Secondary | ICD-10-CM | POA: Diagnosis not present

## 2018-12-08 DIAGNOSIS — Z820 Family history of epilepsy and other diseases of the nervous system: Secondary | ICD-10-CM | POA: Diagnosis not present

## 2018-12-08 DIAGNOSIS — Z818 Family history of other mental and behavioral disorders: Secondary | ICD-10-CM | POA: Insufficient documentation

## 2018-12-08 DIAGNOSIS — K436 Other and unspecified ventral hernia with obstruction, without gangrene: Secondary | ICD-10-CM | POA: Diagnosis not present

## 2018-12-08 DIAGNOSIS — R140 Abdominal distension (gaseous): Secondary | ICD-10-CM | POA: Diagnosis not present

## 2018-12-08 DIAGNOSIS — K420 Umbilical hernia with obstruction, without gangrene: Secondary | ICD-10-CM | POA: Insufficient documentation

## 2018-12-08 DIAGNOSIS — F329 Major depressive disorder, single episode, unspecified: Secondary | ICD-10-CM | POA: Insufficient documentation

## 2018-12-08 DIAGNOSIS — K439 Ventral hernia without obstruction or gangrene: Secondary | ICD-10-CM | POA: Diagnosis present

## 2018-12-08 HISTORY — PX: VENTRAL HERNIA REPAIR: SHX424

## 2018-12-08 SURGERY — REPAIR, HERNIA, VENTRAL, LAPAROSCOPIC
Anesthesia: General | Site: Abdomen

## 2018-12-08 MED ORDER — BUPIVACAINE-EPINEPHRINE (PF) 0.25% -1:200000 IJ SOLN
INTRAMUSCULAR | Status: AC
  Filled 2018-12-08: qty 30

## 2018-12-08 MED ORDER — ONDANSETRON HCL 4 MG/2ML IJ SOLN
INTRAMUSCULAR | Status: DC | PRN
  Administered 2018-12-08: 4 mg via INTRAVENOUS

## 2018-12-08 MED ORDER — MIDAZOLAM HCL 2 MG/2ML IJ SOLN
INTRAMUSCULAR | Status: AC
  Filled 2018-12-08: qty 2

## 2018-12-08 MED ORDER — ACETAMINOPHEN 500 MG PO TABS
1000.0000 mg | ORAL_TABLET | ORAL | Status: AC
  Administered 2018-12-08: 1000 mg via ORAL
  Filled 2018-12-08: qty 2

## 2018-12-08 MED ORDER — FENTANYL CITRATE (PF) 250 MCG/5ML IJ SOLN
INTRAMUSCULAR | Status: DC | PRN
  Administered 2018-12-08 (×2): 50 ug via INTRAVENOUS
  Administered 2018-12-08: 100 ug via INTRAVENOUS
  Administered 2018-12-08: 50 ug via INTRAVENOUS

## 2018-12-08 MED ORDER — SUGAMMADEX SODIUM 200 MG/2ML IV SOLN
INTRAVENOUS | Status: AC
  Filled 2018-12-08: qty 2

## 2018-12-08 MED ORDER — SCOPOLAMINE 1 MG/3DAYS TD PT72
MEDICATED_PATCH | TRANSDERMAL | Status: DC | PRN
  Administered 2018-12-08: 1 via TRANSDERMAL

## 2018-12-08 MED ORDER — BUPIVACAINE LIPOSOME 1.3 % IJ SUSP
20.0000 mL | Freq: Once | INTRAMUSCULAR | Status: AC
  Administered 2018-12-08: 20 mL
  Filled 2018-12-08: qty 20

## 2018-12-08 MED ORDER — SUGAMMADEX SODIUM 200 MG/2ML IV SOLN
INTRAVENOUS | Status: DC | PRN
  Administered 2018-12-08: 200 mg via INTRAVENOUS

## 2018-12-08 MED ORDER — SUCCINYLCHOLINE CHLORIDE 200 MG/10ML IV SOSY
PREFILLED_SYRINGE | INTRAVENOUS | Status: AC
  Filled 2018-12-08: qty 10

## 2018-12-08 MED ORDER — ROCURONIUM BROMIDE 10 MG/ML (PF) SYRINGE
PREFILLED_SYRINGE | INTRAVENOUS | Status: AC
  Filled 2018-12-08: qty 10

## 2018-12-08 MED ORDER — BUPIVACAINE-EPINEPHRINE 0.25% -1:200000 IJ SOLN
INTRAMUSCULAR | Status: DC | PRN
  Administered 2018-12-08: 60 mL

## 2018-12-08 MED ORDER — KETAMINE HCL 10 MG/ML IJ SOLN
INTRAMUSCULAR | Status: AC
  Filled 2018-12-08: qty 1

## 2018-12-08 MED ORDER — PROPOFOL 10 MG/ML IV BOLUS
INTRAVENOUS | Status: DC | PRN
  Administered 2018-12-08: 100 mg via INTRAVENOUS

## 2018-12-08 MED ORDER — EPHEDRINE 5 MG/ML INJ
INTRAVENOUS | Status: AC
  Filled 2018-12-08: qty 10

## 2018-12-08 MED ORDER — LIDOCAINE 2% (20 MG/ML) 5 ML SYRINGE
INTRAMUSCULAR | Status: DC | PRN
  Administered 2018-12-08: 60 mg via INTRAVENOUS

## 2018-12-08 MED ORDER — EPHEDRINE SULFATE 50 MG/ML IJ SOLN
INTRAMUSCULAR | Status: DC | PRN
  Administered 2018-12-08: 5 mg via INTRAVENOUS

## 2018-12-08 MED ORDER — DEXAMETHASONE SODIUM PHOSPHATE 10 MG/ML IJ SOLN
INTRAMUSCULAR | Status: AC
  Filled 2018-12-08: qty 1

## 2018-12-08 MED ORDER — CEFAZOLIN SODIUM-DEXTROSE 2-4 GM/100ML-% IV SOLN
2.0000 g | INTRAVENOUS | Status: AC
  Administered 2018-12-08: 2 g via INTRAVENOUS
  Filled 2018-12-08: qty 100

## 2018-12-08 MED ORDER — ROCURONIUM BROMIDE 50 MG/5ML IV SOSY
PREFILLED_SYRINGE | INTRAVENOUS | Status: DC | PRN
  Administered 2018-12-08 (×3): 10 mg via INTRAVENOUS
  Administered 2018-12-08: 40 mg via INTRAVENOUS
  Administered 2018-12-08: 10 mg via INTRAVENOUS

## 2018-12-08 MED ORDER — HYDROMORPHONE HCL 1 MG/ML IJ SOLN: 0.2500 mg | INTRAMUSCULAR | Status: DC | PRN

## 2018-12-08 MED ORDER — SCOPOLAMINE 1 MG/3DAYS TD PT72
MEDICATED_PATCH | TRANSDERMAL | Status: AC
  Filled 2018-12-08: qty 1

## 2018-12-08 MED ORDER — TRAMADOL HCL 50 MG PO TABS: 50.0000 mg | ORAL_TABLET | Freq: Four times a day (QID) | ORAL | 0 refills | Status: DC | PRN

## 2018-12-08 MED ORDER — FENTANYL CITRATE (PF) 250 MCG/5ML IJ SOLN
INTRAMUSCULAR | Status: AC
  Filled 2018-12-08: qty 5

## 2018-12-08 MED ORDER — SUCCINYLCHOLINE CHLORIDE 200 MG/10ML IV SOSY
PREFILLED_SYRINGE | INTRAVENOUS | Status: DC | PRN
  Administered 2018-12-08: 80 mg via INTRAVENOUS

## 2018-12-08 MED ORDER — DEXAMETHASONE SODIUM PHOSPHATE 10 MG/ML IJ SOLN
INTRAMUSCULAR | Status: DC | PRN
  Administered 2018-12-08: 5 mg via INTRAVENOUS

## 2018-12-08 MED ORDER — PROPOFOL 10 MG/ML IV BOLUS
INTRAVENOUS | Status: AC
  Filled 2018-12-08: qty 20

## 2018-12-08 MED ORDER — LACTATED RINGERS IV SOLN
INTRAVENOUS | Status: DC
  Administered 2018-12-08: 12:00:00 via INTRAVENOUS

## 2018-12-08 MED ORDER — LIDOCAINE 2% (20 MG/ML) 5 ML SYRINGE
INTRAMUSCULAR | Status: AC
  Filled 2018-12-08: qty 5

## 2018-12-08 MED ORDER — 0.9 % SODIUM CHLORIDE (POUR BTL) OPTIME
TOPICAL | Status: DC | PRN
  Administered 2018-12-08: 14:00:00 1000 mL

## 2018-12-08 MED ORDER — ONDANSETRON HCL 4 MG/2ML IJ SOLN
INTRAMUSCULAR | Status: AC
  Filled 2018-12-08: qty 2

## 2018-12-08 MED ORDER — LIDOCAINE HCL 2 % IJ SOLN
INTRAMUSCULAR | Status: AC
  Filled 2018-12-08: qty 20

## 2018-12-08 MED ORDER — LIDOCAINE 2% (20 MG/ML) 5 ML SYRINGE
INTRAMUSCULAR | Status: DC | PRN
  Administered 2018-12-08: 1.5 mg/kg/h via INTRAVENOUS

## 2018-12-08 MED ORDER — MIDAZOLAM HCL 5 MG/5ML IJ SOLN
INTRAMUSCULAR | Status: DC | PRN
  Administered 2018-12-08: 2 mg via INTRAVENOUS

## 2018-12-08 MED ORDER — KETAMINE HCL 10 MG/ML IJ SOLN
INTRAMUSCULAR | Status: DC | PRN
  Administered 2018-12-08: 30 mg via INTRAVENOUS

## 2018-12-08 SURGICAL SUPPLY — 41 items
APPLIER CLIP 5 13 M/L LIGAMAX5 (MISCELLANEOUS)
APR CLP MED LRG 5 ANG JAW (MISCELLANEOUS)
BINDER ABDOMINAL 12 ML 46-62 (SOFTGOODS) ×2 IMPLANT
CABLE HIGH FREQUENCY MONO STRZ (ELECTRODE) ×2 IMPLANT
CHLORAPREP W/TINT 26 (MISCELLANEOUS) ×2 IMPLANT
CLIP APPLIE 5 13 M/L LIGAMAX5 (MISCELLANEOUS) IMPLANT
COVER SURGICAL LIGHT HANDLE (MISCELLANEOUS) ×2 IMPLANT
COVER WAND RF STERILE (DRAPES) ×2 IMPLANT
DECANTER SPIKE VIAL GLASS SM (MISCELLANEOUS) ×2 IMPLANT
DEVICE SECURE STRAP 25 ABSORB (INSTRUMENTS) ×2 IMPLANT
DEVICE TROCAR PUNCTURE CLOSURE (ENDOMECHANICALS) ×2 IMPLANT
DRAPE WARM FLUID 44X44 (DRAPES) ×2 IMPLANT
DRSG TEGADERM 2-3/8X2-3/4 SM (GAUZE/BANDAGES/DRESSINGS) ×2 IMPLANT
DRSG TEGADERM 4X4.75 (GAUZE/BANDAGES/DRESSINGS) ×2 IMPLANT
ELECT REM PT RETURN 15FT ADLT (MISCELLANEOUS) ×2 IMPLANT
GAUZE SPONGE 2X2 8PLY STRL LF (GAUZE/BANDAGES/DRESSINGS) ×1 IMPLANT
GLOVE ECLIPSE 8.0 STRL XLNG CF (GLOVE) ×2 IMPLANT
GLOVE INDICATOR 8.0 STRL GRN (GLOVE) ×2 IMPLANT
GOWN STRL REUS W/TWL XL LVL3 (GOWN DISPOSABLE) ×4 IMPLANT
IRRIG SUCT STRYKERFLOW 2 WTIP (MISCELLANEOUS)
IRRIGATION SUCT STRKRFLW 2 WTP (MISCELLANEOUS) IMPLANT
KIT BASIN OR (CUSTOM PROCEDURE TRAY) ×2 IMPLANT
KIT TURNOVER KIT A (KITS) IMPLANT
MARKER SKIN DUAL TIP RULER LAB (MISCELLANEOUS) ×2 IMPLANT
MESH VENTRALIGHT ST 6X8 (Mesh Specialty) ×2 IMPLANT
MESH VENTRLGHT ELLIPSE 8X6XMFL (Mesh Specialty) ×1 IMPLANT
NEEDLE SPNL 22GX3.5 QUINCKE BK (NEEDLE) IMPLANT
PAD POSITIONING PINK XL (MISCELLANEOUS) ×2 IMPLANT
SCISSORS LAP 5X35 DISP (ENDOMECHANICALS) ×2 IMPLANT
SET TUBE SMOKE EVAC HIGH FLOW (TUBING) ×2 IMPLANT
SLEEVE ADV FIXATION 5X100MM (TROCAR) ×2 IMPLANT
SPONGE GAUZE 2X2 STER 10/PKG (GAUZE/BANDAGES/DRESSINGS) ×1
STRIP CLOSURE SKIN 1/2X4 (GAUZE/BANDAGES/DRESSINGS) ×2 IMPLANT
SUT MNCRL AB 4-0 PS2 18 (SUTURE) ×2 IMPLANT
SUT PDS AB 1 CT1 27 (SUTURE) ×4 IMPLANT
SUT PROLENE 1 CT 1 30 (SUTURE) ×10 IMPLANT
TOWEL OR 17X26 10 PK STRL BLUE (TOWEL DISPOSABLE) ×2 IMPLANT
TRAY LAPAROSCOPIC (CUSTOM PROCEDURE TRAY) ×2 IMPLANT
TROCAR ADV FIXATION 11X100MM (TROCAR) IMPLANT
TROCAR ADV FIXATION 5X100MM (TROCAR) ×2 IMPLANT
TROCAR BLADELESS OPT 5 100 (ENDOMECHANICALS) ×2 IMPLANT

## 2018-12-08 NOTE — H&P (Signed)
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 She's noticed that it is out all the time now and somewhat stuck. Occasionally sensitive. Bothering her. She's not planning to have anymore children. Husband about vasectomy. She is interested in considering getting repaired since it seems to be being occasionally bothersome. She's never had any abdominal surgery. She was he moves her bowels every days. She does not smoke. No C-sections. 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 No history of MRSA or infections. No diabetes. No cardiac nor pulmonary issues  No new events.  Ready to proceed with surgery.  (Review of systems as stated in this history (HPI) or in the review of systems. Otherwise all other 12 point ROS are negative) ` ` `   Past Surgical History Illene Regulus, CMA; 04/12/2018 8:46 AM) Foot Surgery Left. Oral Surgery  Diagnostic Studies History Illene Regulus, CMA; 04/12/2018 8:46 AM) Colonoscopy never  Allergies Lars Mage Spillers, CMA; 04/12/2018 8:36 AM) No Known Drug Allergies [04/12/2018]:  Medication History (Alisha Spillers, CMA; 04/12/2018 8:38 AM) buPROPion HCl ER (XL) (150MG  Tablet ER 24HR, Oral) Active. Citalopram Hydrobromide (20MG  Tablet, Oral) Active. Medications Reconciled  Social History Illene Regulus, CMA; 04/12/2018 8:46 AM) Tobacco use Never smoker.   Family History Illene Regulus, [place]; 04/12/2018 8:46 AM) Arthritis Mother. Depression Mother. Migraine Headache Mother, Sister.  Pregnancy / Birth History Illene Regulus, CMA; 04/12/2018 8:46 AM) Contraceptive History Oral contraceptives. Length (months) of breastfeeding 12-24  Other Problems Lars Mage Spillers, CMA; 04/12/2018 8:46 AM) Anxiety Disorder Depression Umbilical Hernia Repair     Review of Systems (Alisha Spillers CMA; 04/12/2018 8:46 AM) General Not Present- Appetite Loss, Chills, Fatigue, Fever, Night Sweats, Weight Gain and Weight Loss. Skin Not Present- Change in Wart/Mole, Dryness, Hives, Jaundice, New Lesions, Non-Healing Wounds, Rash and Ulcer. HEENT Present- Seasonal Allergies. Not Present- Earache, Hearing Loss, Hoarseness, Nose Bleed, Oral Ulcers, Ringing in the Ears, Sinus Pain, Sore Throat, Visual Disturbances, Wears glasses/contact lenses and Yellow Eyes. Respiratory Not Present- Bloody sputum, Chronic Cough, Difficulty Breathing, Snoring and Wheezing. Breast Not Present- Breast Mass, Breast Pain, Nipple Discharge and Skin Changes. Gastrointestinal Present- Bloating. Not Present- Abdominal Pain, Bloody Stool, Change in Bowel Habits, Chronic diarrhea, Constipation, Difficulty Swallowing, Excessive gas, Gets full quickly at meals, Hemorrhoids, Indigestion, Nausea, Rectal Pain and Vomiting. Female Genitourinary Not Present- Frequency, Nocturia, Painful Urination, Pelvic Pain and Urgency. Endocrine Not Present- Cold Intolerance, Excessive Hunger, Hair Changes, Heat Intolerance, Hot flashes and New Diabetes. Hematology Not Present- Blood Thinners, Easy Bruising, Excessive bleeding, Gland problems, HIV and Persistent Infections.  Vitals (Alisha Spillers CMA; 04/12/2018 8:36 AM) 04/12/2018 8:36 AM Weight: 162 lb Height: 65in Body Surface Area: 1.81 m Body Mass Index: 26.96 kg/m  Pulse: 74 (Regular)  BP: 104/68 (Sitting, Left Arm,  Standard)  BP 120/72   Pulse (!) 56   Temp 98.7 F (37.1 C) (Oral)   Resp 16   LMP 11/23/2018 (Exact Date)   SpO2 100%  Physical Exam Adin Hector MD; 04/12/2018 9:09 AM)  General Mental Status-Alert. General Appearance-Not in acute distress, Not Sickly. Orientation-Oriented X3. Hydration-Well hydrated. Voice-Normal.  Integumentary Global Assessment Upon inspection and palpation of skin surfaces of the - Axillae: non-tender, no inflammation or ulceration, no drainage. and Distribution of scalp and body hair is normal. General Characteristics Temperature - normal warmth is noted.  Head and Neck Head-normocephalic, atraumatic with no lesions or palpable masses. Face Global Assessment - atraumatic, no absence of expression. Neck Global Assessment - no abnormal movements, no bruit auscultated on the right, no bruit auscultated on the left, no decreased range of motion, non-tender. Trachea-midline. Thyroid Gland Characteristics - non-tender.  Eye Eyeball - Left-Extraocular movements intact, No Nystagmus. Eyeball - Right-Extraocular movements intact, No Nystagmus. Cornea - Left-No Hazy. Cornea - Right-No Hazy. Sclera/Conjunctiva - Left-No scleral icterus, No Discharge. Sclera/Conjunctiva - Right-No scleral icterus, No Discharge. Pupil - Left-Direct reaction to light normal. Pupil - Right-Direct reaction to light normal.  ENMT Ears Pinna - Left - no drainage observed, no generalized tenderness observed. Right - no drainage observed, no generalized tenderness observed. Nose and Sinuses External Inspection of the Nose - no destructive lesion observed. Inspection of the nares - Left - quiet respiration. Right - quiet respiration. Mouth and Throat Lips - Upper Lip - no fissures observed, no pallor noted. Lower Lip - no fissures observed, no pallor noted. Nasopharynx - no discharge present. Oral Cavity/Oropharynx - Tongue - no  dryness observed. Oral Mucosa - no cyanosis observed. Hypopharynx - no evidence of airway distress observed.  Chest and Lung Exam Inspection Movements - Normal and Symmetrical. Accessory muscles - No use of accessory muscles in breathing. Palpation Palpation of the chest reveals - Non-tender. Auscultation Breath sounds - Normal and Clear.  Cardiovascular Auscultation Rhythm - Regular. Murmurs & Other Heart Sounds - Auscultation of the heart reveals - No Murmurs and No Systolic Clicks.  Abdomen Inspection Inspection of the abdomen reveals - No Visible peristalsis and No Abnormal pulsations. Umbilicus - No Bleeding, No Urine drainage. Palpation/Percussion Palpation and Percussion of the abdomen reveal - Soft, Non Tender, No Rebound tenderness, No Rigidity (guarding) and No Cutaneous hyperesthesia. Note: 3 cm supraumbilical mass partially reducible sensitive. Separate umbilical hernia only at base of stalk.  Abdomen soft. Not distended. No distasis recti.   Female Genitourinary Sexual Maturity Tanner 5 - Adult hair pattern. Note: No vaginal bleeding nor discharge  Peripheral Vascular Upper Extremity Inspection - Left - No Cyanotic nailbeds, Not Ischemic. Right - No Cyanotic nailbeds, Not Ischemic.  Neurologic Neurologic evaluation reveals -normal attention span and ability to concentrate, able to name objects and repeat phrases. Appropriate fund of knowledge , normal sensation and normal coordination. Mental Status Affect - not angry, not paranoid. Cranial Nerves-Normal Bilaterally. Gait-Normal.  Neuropsychiatric Mental status exam performed with findings of-able to articulate well with normal speech/language, rate, volume and coherence, thought content normal with ability to perform basic computations and apply abstract reasoning and no evidence of hallucinations, delusions, obsessions or homicidal/suicidal ideation.  Musculoskeletal Global Assessment  Spine, Ribs and Pelvis - no instability, subluxation or laxity. Right Upper Extremity - no instability, subluxation or laxity.  Lymphatic Head & Neck  General Head & Neck Lymphatics: Bilateral - Description - No Localized lymphadenopathy. Axillary  General Axillary Region: Bilateral - Description - No Localized lymphadenopathy. Femoral & Inguinal  Generalized Femoral & Inguinal Lymphatics: Left - Description - No Localized lymphadenopathy. Right - Description - No Localized lymphadenopathy.    Assessment &  Plan Adin Hector MD; 04/12/2018 9:25 AM)  EPIGASTRIC HERNIA (K43.9) Impression: Partially incarcerated hernia just above the bellybutton. Not fully reducible. Umbilical hernia as well.  I think she would benefit from diagnostic laparoscopy to help map out the region of hernias. Given her activity level and young age and multiple locations, I think mesh underlay repair would be a good idea to lower her recurrence. She is interested in proceeding.  I did caution her against going back to work right away. Most likely 2 weeks for light duty & 6 weeks fully unrestricted. Guarded against recommending her flying for the first 3 weeks   UMBILICAL HERNIA WITHOUT OBSTRUCTION AND WITHOUT GANGRENE (K42.9) Impression: Small hernia within the umbilical stalk. Would repair at the time of the larger supraumbilical hernia   The anatomy & physiology of the abdominal wall was discussed. The pathophysiology of hernias was discussed. Natural history risks without surgery including progeressive enlargement, pain, incarceration, & strangulation was discussed. Contributors to complications such as smoking, obesity, diabetes, prior surgery, etc were discussed.  I feel the risks of no intervention will lead to serious problems that outweigh the operative risks; therefore, I recommended surgery to reduce and repair the hernia. I explained laparoscopic techniques with possible need for  an open approach. I noted the probable use of mesh to patch and/or buttress the hernia repair  Risks such as bleeding, infection, abscess, need for further treatment, heart attack, death, and other risks were discussed. I noted a good likelihood this will help address the problem. Goals of post-operative recovery were discussed as well. Possibility that this will not correct all symptoms was explained. I stressed the importance of low-impact activity, aggressive pain control, avoiding constipation, & not pushing through pain to minimize risk of post-operative chronic pain or injury. Possibility of reherniation especially with smoking, obesity, diabetes, immunosuppression, and other health conditions was discussed. We will work to minimize complications.  An educational handout further explaining the pathology & treatment options was given as well. Questions were answered. The patient expresses understanding & wishes to proceed with surgery.    Adin Hector, MD, FACS, MASCRS Gastrointestinal and Minimally Invasive Surgery    [address] [place], [place] [address] [phone] Main / Paging [phone] Fax

## 2018-12-08 NOTE — Anesthesia Postprocedure Evaluation (Signed)
Anesthesia Post Note  Patient: Melanie Ramirez  Procedure(s) Performed: LAPAROSCOPIC VENTRAL WALL HERNIA REPAIR WITH MESH (N/A Abdomen)     Patient location during evaluation: PACU Anesthesia Type: General Level of consciousness: awake and alert Pain management: pain level controlled Vital Signs Assessment: post-procedure vital signs reviewed and stable Respiratory status: spontaneous breathing, nonlabored ventilation, respiratory function stable and patient connected to nasal cannula oxygen Cardiovascular status: blood pressure returned to baseline and stable Postop Assessment: no apparent nausea or vomiting Anesthetic complications: no    Last Vitals:  Vitals:   12/08/18 1500 12/08/18 1515  BP: (!) 145/78 (!) 144/82  Pulse: 87 91  Resp: 18 18  Temp:    SpO2: 100% 96%    Last Pain:  Vitals:   12/08/18 1515  TempSrc:   PainSc: 0-No pain                 Jordell Outten,W. EDMOND

## 2018-12-08 NOTE — Interval H&P Note (Signed)
History and Physical Interval Note:  12/08/2018 12:09 PM  Melanie Ramirez  has presented today for surgery, with the diagnosis of SUPRAUMBILICAL INCARCERATED HERNIA.  The various methods of treatment have been discussed with the patient and family. After consideration of risks, benefits and other options for treatment, the patient has consented to  Procedure(s): [place] (N/A) as a surgical intervention.  The patient's history has been reviewed, patient examined, no change in status, stable for surgery.  I have reviewed the patient's chart and labs.  Questions were answered to the patient's satisfaction.    I have re-reviewed the the patient's records, history, medications, and allergies.  I have re-examined the patient.  I again discussed intraoperative plans and goals of post-operative recovery.  The patient agrees to proceed.  Melanie Ramirez  06-22-1981 [id number]  Patient Care Team: Lennie Odor, PA-C as PCP - General (Nurse Practitioner) Juliene Pina, CNM as Midwife (Obstetrics and Gynecology) Michael Boston, MD as Consulting Physician (General Surgery)  Patient Active Problem List   Diagnosis Date Noted  . Supraumbilical hernia 49/67/5916  . Umbilical hernia 38/46/6599  . Sprain of right ankle 09/09/2015  . Anxiety 05/30/2015  . 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 SVD (spontaneous vaginal delivery) 06/16/2014  . Postpartum care following vaginal delivery (12/25) 06/16/2014  . Normal labor 06/15/2014  . Abnormality of forces of labor 06/15/2014    Past Medical History:  Diagnosis Date  . Active labor 03/21/2011  . Anxiety   . Depression   . Headache(784.0)    occ. migraines with periods  . Pneumonia    as teenager  . Postpartum care following vaginal delivery (9/28) 03/21/2011    Past Surgical History:  Procedure Laterality Date  . DENTAL SURGERY    . DILATION AND EVACUATION N/A 07/29/2013   Procedure: Ultrasound guided  Suction DILATATION AND EVACUATION (D&E) 2ND TRIMESTER with tissue sent for chromosome analysis;  Surgeon: Lovenia Kim, MD;  Location: [facility];  Service: Gynecology;  Laterality: N/A;  . FOOT SURGERY Left 2018   calcaneus    Social History   Socioeconomic History  . Marital status: Married    Spouse name: Not on file  . Number of children: Not on file  . Years of education: Not on file  . Highest education level: Not on file  Occupational History  . Not on file  Social Needs  . Financial resource strain: Not on file  . Food insecurity    Worry: Not on file    Inability: Not on file  . Transportation needs    Medical: Not on file    Non-medical: Not on file  Tobacco Use  . Smoking status: Never Smoker  . Smokeless tobacco: Never Used  Substance and Sexual Activity  . Alcohol use: Yes    Alcohol/week: 1.0 standard drinks    Types: 1 Glasses of wine per week    Comment: weekly  . Drug use: No  . Sexual activity: Yes  Lifestyle  . Physical activity    Days per week: Not on file    Minutes per session: Not on file  . Stress: Not on file  Relationships  . Social Herbalist on phone: Not on file    Gets together: Not on file    Attends religious service: Not on file    Active member of club or organization: Not on file    Attends meetings of clubs or organizations: Not on file  Relationship status: Not on file  . Intimate partner violence    Fear of current or ex partner: Not on file    Emotionally abused: Not on file    Physically abused: Not on file    Forced sexual activity: Not on file  Other Topics Concern  . Not on file  Social History Narrative  . Not on file    Family History  Problem Relation Age of Onset  . Depression Mother   . Depression Father   . Depression Sister   . Depression Brother   . Depression Daughter   . Depression Son     Medications Prior to Admission  Medication Sig Dispense Refill Last Dose  . buPROPion  (WELLBUTRIN XL) 150 MG 24 hr tablet Take 150 mg by mouth daily.     . citalopram (CELEXA) 40 MG tablet Take 40 mg by mouth daily.      . Magnesium 300 MG CAPS Take 300 mg by mouth every evening.     . Multiple Vitamins-Minerals (MULTIVITAMIN WITH MINERALS) tablet Take 1 tablet by mouth daily.       Current Facility-Administered Medications  Medication Dose Route Frequency Provider Last Rate Last Dose  . acetaminophen (TYLENOL) tablet 1,000 mg  1,000 mg Oral On Call to OR Michael Boston, MD      . bupivacaine liposome (EXPAREL) 1.3 % injection 266 mg  20 mL Infiltration Once Michael Boston, MD      . ceFAZolin (ANCEF) IVPB 2g/100 mL premix  2 g Intravenous On Call to OR Michael Boston, MD      . lactated ringers infusion   Intravenous Continuous Roderic Palau, MD         No Known Allergies  BP 120/72   Pulse (!) 56   Temp 98.7 F (37.1 C) (Oral)   Resp 16   LMP 11/23/2018 (Exact Date)   SpO2 100%   Labs: No results found for this or any previous visit (from the past 48 hour(s)).  Imaging / Studies: No results found.   Adin Hector, M.D., F.A.C.S. Gastrointestinal and Minimally Invasive Surgery [facility] [address] [place], [place] [address] [phone] Main / Paging  12/08/2018 12:09 PM    Adin Hector

## 2018-12-08 NOTE — Anesthesia Procedure Notes (Signed)
Procedure Name: Intubation Date/Time: 12/08/2018 12:59 PM Performed by: Maxwell Caul, CRNA Pre-anesthesia Checklist: Patient identified, Emergency Drugs available, Suction available and Patient being monitored Patient Re-evaluated:Patient Re-evaluated prior to induction Oxygen Delivery Method: Circle system utilized Preoxygenation: Pre-oxygenation with 100% oxygen Induction Type: IV induction Laryngoscope Size: Mac and 3 Grade View: Grade I Tube type: Oral Tube size: 7.0 mm Number of attempts: 1 Airway Equipment and Method: Stylet Placement Confirmation: ETT inserted through vocal cords under direct vision,  positive ETCO2 and breath sounds checked- equal and bilateral Secured at: 21 cm Tube secured with: Tape Dental Injury: Teeth and Oropharynx as per pre-operative assessment

## 2018-12-08 NOTE — Transfer of Care (Signed)
Immediate Anesthesia Transfer of Care Note  Patient: Melanie Ramirez  Procedure(s) Performed: LAPAROSCOPIC VENTRAL WALL HERNIA REPAIR WITH MESH (N/A Abdomen)  Patient Location: PACU  Anesthesia Type:General  Level of Consciousness: awake, alert  and oriented  Airway & Oxygen Therapy: Patient Spontanous Breathing and Patient connected to face mask oxygen  Post-op Assessment: Report given to RN  Post vital signs: Reviewed and stable  Last Vitals:  Vitals Value Taken Time  BP 139/93 12/08/18 1450  Temp    Pulse 99 12/08/18 1451  Resp 21 12/08/18 1451  SpO2 100 % 12/08/18 1451  Vitals shown include unvalidated device data.  Last Pain:  Vitals:   12/08/18 1200  TempSrc: Oral         Complications: No apparent anesthesia complications

## 2018-12-08 NOTE — Op Note (Signed)
12/08/2018  PATIENT:  Melanie Ramirez  38 y.o. female  Patient Care Team: Lennie Odor, PA-C as PCP - General (Nurse Practitioner) Juliene Pina, CNM as Midwife (Obstetrics and Gynecology) Michael Boston, MD as Consulting Physician (General Surgery)  PRE-OPERATIVE DIAGNOSIS:  SUPRAUMBILICAL & UMBILICAL INCARCERATED HERNIAS  POST-OPERATIVE DIAGNOSIS:  SUPRAUMBILICAL & UMBILICAL INCARCERATED HERNIAS  PROCEDURE:  LAPAROSCOPIC VENTRAL WALL HERNIA REPAIR WITH MESH  SURGEON:  Adin Hector, MD  ASSISTANT: Carlena Hurl, PA-C  ANESTHESIA:     General  Nerve block provided with liposomal bupivacaine (Experel) mixed with 0.25% bupivacaine as a Bilateral TAP block x 48mL each side at the level of the transverse abdominis & preperitoneal spaces along the flank at the anterior axillary line, from subcostal ridge to iliac crest under laparoscopic guidance   EBL:  No intake/output data recorded.  Per anesthesia record  Delay start of Pharmacological VTE agent (>24hrs) due to surgical blood loss or risk of bleeding:  no  DRAINS: none   SPECIMEN:  No Specimen  DISPOSITION OF SPECIMEN:  N/A  COUNTS:  YES  PLAN OF CARE: Discharge to home after PACU  PATIENT DISPOSITION:  PACU - hemodynamically stable.  INDICATION: Pleasant patient has developed a ventral wall abdominal hernia.   Recommendation was made for surgical repair:  The anatomy & physiology of the abdominal wall was discussed. The pathophysiology of hernias was discussed. Natural history risks without surgery including progeressive enlargement, pain, incarceration & strangulation was discussed. Contributors to complications such as smoking, obesity, diabetes, prior surgery, etc were discussed.  I feel the risks of no intervention will lead to serious problems that outweigh the operative risks; therefore, I recommended surgery to reduce and repair the hernia. I explained laparoscopic techniques with possible need for an open  approach. I noted the probable use of mesh to patch and/or buttress the hernia repair  Risks such as bleeding, infection, abscess, need for further treatment, heart attack, death, and other risks were discussed. I noted a good likelihood this will help address the problem. Goals of post-operative recovery were discussed as well. Possibility that this will not correct all symptoms was explained. I stressed the importance of low-impact activity, aggressive pain control, avoiding constipation, & not pushing through pain to minimize risk of post-operative chronic pain or injury. Possibility of reherniation especially with smoking, obesity, diabetes, immunosuppression, and other health conditions was discussed. We will work to minimize complications.  An educational handout further explaining the pathology & treatment options was given as well. Questions were answered. The patient expresses understanding & wishes to proceed with surgery.   OR FINDINGS: Periumbilical hernias incarcerated with omentum and falciform ligament.  5 x 3 cm region in the setting of moderate diastases recti.  Type of repair: Laparoscopic underlay repair   Placement of mesh: Intraperitoneal underlay repair  Name of mesh: Bard Ventralight dual sided (polypropylene / Seprafilm)  Size of mesh: 20x15cm  Orientation: Vertical  Mesh overlap:  5-7cm   DESCRIPTION:   Informed consent was confirmed. The patient underwent general anaesthesia without difficulty. The patient was positioned appropriately. VTE prevention in place. The patient's abdomen was clipped, prepped, & draped in a sterile fashion. Surgical timeout confirmed our plan.  The patient was positioned in reverse Trendelenburg. Abdominal entry was gained using optical entry technique in the left upper abdomen. Entry was clean. I induced carbon dioxide insufflation. Camera inspection revealed no injury. Extra ports were carefully placed under direct laparoscopic  visualization.   I could see hernias on the  parietal peritoneum under the abdominal wall.  I did freed off the falciform ligament and central peritoneum to expose the retrorectus fascia using laparoscopic scissors and focused blunt dissection with cautery.   I made sure hemostasis was good.  I mapped out the region using a needle passer.   To ensure that I would have at least 5 cm radial coverage outside of the hernia defect, I chose a 20x15cm dual sided mesh.  I placed #1 Prolene stitches around its edge about every 5 cm = 10 total.  I rolled the mesh & placed into the peritoneal cavity through the largest hernia defect.  I unrolled the mesh and positioned it appropriately.  I secured the mesh to cover up the hernia defect using a laparoscopic suture passer to pass the tails of the Prolene through the abdominal wall & tagged them with clamps for good transfascial suturing.  I started out in four corners to make sure I had the mesh centered under the hernia defect appropriately, and then proceeded to work in quadrants.    We evacuated CO2 & desufflated the abdomen.  I tied the fascial stitches down. I closed the fascial defect of the larger supraumbilical hernia that I placed the mesh through using #1 PDS interrupted transverse stitches primarily.  I reinsufflated the abdomen. The mesh provided at least circumferential coverage around the entire region of hernia defects.  I secured the mesh centrally with an additional trans fascial stitch in & out the mesh using #1 PDS under laparoscopic visualization use that suture to help re-tacked the peritoneum and falciform ligament back to the abdominal wall such that the superior and inferior corners of the mesh and centrally would be covered and be truly retrorectal.   I tacked the edges & central part of the mesh to the peritoneum/posterior rectus fascia with SecureStrap absorbable tacks.   I did reinspection. Hemostasis was good. Mesh laid well. I completed a broad  field block of local anesthesia at fascial stitch sites & fascial closure areas.    Capnoperitoneum was evacuated. Ports were removed. The skin was closed with Monocryl at the port sites and Steri-Strips on the fascial stitch puncture sites.  Patient is being extubated to go to the recovery room.  I discussed operative findings, updated the patient's status, discussed probable steps to recovery, and gave postoperative recommendations to the patient's spouse.  Recommendations were made.  Questions were answered.  He expressed understanding & appreciation.  Adin Hector, M.D., F.A.C.S. Gastrointestinal and Minimally Invasive Surgery [facility] [address], [place], [place] [address] [phone] Main / Paging  12/08/2018 2:42 PM

## 2018-12-08 NOTE — Discharge Instructions (Signed)
HERNIA REPAIR: POST OP INSTRUCTIONS ° °###################################################################### ° °EAT °Gradually transition to a high fiber diet with a fiber supplement over the next few weeks after discharge.  Start with a pureed / full liquid diet (see below) ° °WALK °Walk an hour a day.  Control your pain to do that.   ° °CONTROL PAIN °Control pain so that you can walk, sleep, tolerate sneezing/coughing, and go up/down stairs. ° °HAVE A BOWEL MOVEMENT DAILY °Keep your bowels regular to avoid problems.  OK to try a laxative to override constipation.  OK to use an antidairrheal to slow down diarrhea.  Call if not better after 2 tries ° °CALL IF YOU HAVE PROBLEMS/CONCERNS °Call if you are still struggling despite following these instructions. °Call if you have concerns not answered by these instructions ° °###################################################################### ° ° ° °1. DIET: Follow a light bland diet the first 24 hours after arrival home, such as soup, liquids, crackers, etc.  Be sure to include lots of fluids daily.  Advance to a low fat / high fiber diet over the next few days after surgery.  Avoid fast food or heavy meals the first week as your are more likely to get nauseated.   ° °2. Take your usually prescribed home medications unless otherwise directed. ° °3. PAIN CONTROL: °a. Pain is best controlled by a usual combination of three different methods TOGETHER: °i. Ice/Heat °ii. Over the counter pain medication °iii. Prescription pain medication °b. Most patients will experience some swelling and bruising around the hernia(s) such as the bellybutton, groins, or old incisions.  Ice packs or heating pads (30-60 minutes up to 6 times a day) will help. Use ice for the first few days to help decrease swelling and bruising, then switch to heat to help relax tight/sore spots and speed recovery.  Some people prefer to use ice alone, heat alone, alternating between ice & heat.  Experiment  to what works for you.  Swelling and bruising can take several weeks to resolve.   °c. It is helpful to take an over-the-counter pain medication regularly for the first few weeks.  Choose one of the following that works best for you: °i. Naproxen (Aleve, etc)  Two 220mg tabs twice a day °ii. Ibuprofen (Advil, etc) Three 200mg tabs four times a day (every meal & bedtime) °iii. Acetaminophen (Tylenol, etc) 325-650mg four times a day (every meal & bedtime) °d. A  prescription for pain medication should be given to you upon discharge.  Take your pain medication as prescribed.  °i. If you are having problems/concerns with the prescription medicine (does not control pain, nausea, vomiting, rash, itching, etc), please call us [phone] to see if we need to switch you to a different pain medicine that will work better for you and/or control your side effect better. °ii. If you need a refill on your pain medication, please contact your pharmacy.  They will contact our office to request authorization. Prescriptions will not be filled after 5 pm or on week-ends. ° °4. Avoid getting constipated.  Between the surgery and the pain medications, it is common to experience some constipation.  Increasing fluid intake and taking a fiber supplement (such as Metamucil, Citrucel, FiberCon, MiraLax, etc) 1-2 times a day regularly will usually help prevent this problem from occurring.  A mild laxative (prune juice, Milk of Magnesia, MiraLax, etc) should be taken according to package directions if there are no bowel movements after 48 hours.   ° °5. Wash / shower every   day.  You may shower over the dressings as they are waterproof.    6. Remove your waterproof bandages, skin tapes, and other bandages 5 days after surgery. You may replace a dressing/Band-Aid to cover the incision for comfort if you wish. You may leave the incisions open to air.  You may replace a dressing/Band-Aid to cover an incision for comfort if you wish.   Continue to shower over incision(s) after the dressing is off.  7. ACTIVITIES as tolerated:   a. You may resume regular (light) daily activities beginning the next day--such as daily self-care, walking, climbing stairs--gradually increasing activities as tolerated.  Control your pain so that you can walk an hour a day.  If you can walk 30 minutes without difficulty, it is safe to try more intense activity such as jogging, treadmill, bicycling, low-impact aerobics, swimming, etc. b. Save the most intensive and strenuous activity for last such as sit-ups, heavy lifting, contact sports, etc  Refrain from any heavy lifting or straining until you are off narcotics for pain control.   c. DO NOT PUSH THROUGH PAIN.  Let pain be your guide: If it hurts to do something, don't do it.  Pain is your body warning you to avoid that activity for another week until the pain goes down. d. You may drive when you are no longer taking prescription pain medication, you can comfortably wear a seatbelt, and you can safely maneuver your car and apply brakes. e. Dennis Bast may have sexual intercourse when it is comfortable.   8. FOLLOW UP in our office a. Please call [facility] at [phone] to set up an appointment to see your surgeon in the office for a follow-up appointment approximately 2-3 weeks after your surgery. b. Make sure that you call for this appointment the day you arrive home to insure a convenient appointment time.  9.  If you have disability of FMLA / Family leave forms, please bring the forms to the office for processing.  (do not give to your surgeon).  WHEN TO CALL us [phone]: 1. Poor pain control 2. Reactions / problems with new medications (rash/itching, nausea, etc)  3. Fever over 101.5 F (38.5 C) 4. Inability to urinate 5. Nausea and/or vomiting 6. Worsening swelling or bruising 7. Continued bleeding from incision. 8. Increased pain, redness, or drainage from the incision   The clinic staff is  available to answer your questions during regular business hours (8:30am-5pm).  Please dont hesitate to call and ask to speak to one of our nurses for clinical concerns.   If you have a medical emergency, go to the nearest emergency room or call 911.  A surgeon from [facility] is always on call at the hospitals in [facility], [place], [place], [place], [place]  [address] ?  [address] [address], [place], [place]   [address] MAIN: [phone] ? TOLL FREE: [phone] ? FAX: [phone] [email or web address]    Hernia, Adult     A hernia is the bulging of an organ or tissue through a weak spot in the muscles of the abdomen (abdominal wall). Hernias develop most often near the belly button (navel) or the area where the leg meets the lower abdomen (groin). Common types of hernias include:  Incisional hernia. This type bulges through a scar from an abdominal surgery.  Umbilical hernia. This type develops near the navel.  Inguinal hernia. This type develops in the groin or scrotum.  Femoral hernia.  This type develops under the groin, in the upper thigh area.  Hiatal hernia. This type occurs when part of the stomach slides above the muscle that separates the abdomen from the chest (diaphragm). What are the causes? This condition may be caused by:  Heavy lifting.  Coughing over a long period of time.  Straining to have a bowel movement. Constipation can lead to straining.  An incision made during an abdominal surgery.  A physical problem that is present at birth (congenital defect).  Being overweight or obese.  Smoking.  Excess fluid in the abdomen.  Undescended testicles in males. What are the signs or symptoms? The main symptom is a skin-colored, rounded bulge in the area of the hernia. However, a bulge may not always be present. It may grow bigger or be more visible when you cough or strain (such as when  lifting something heavy). A hernia that can be pushed back into the area (is reducible) rarely causes pain. A hernia that cannot be pushed back into the area (is incarcerated) may lose its blood supply (become strangulated). A hernia that is incarcerated may cause:  Pain.  Fever.  Nausea and vomiting.  Swelling.  Constipation. How is this diagnosed? A hernia may be diagnosed based on:  Your symptoms and medical history.  A physical exam. Your health care provider may ask you to cough or move in certain ways to see if the hernia becomes visible.  Imaging tests, such as: ? X-rays. ? Ultrasound. ? CT scan. How is this treated? A hernia that is small and painless may not need to be treated. A hernia that is large or painful may be treated with surgery. Inguinal hernias may be treated with surgery to prevent incarceration or strangulation. Strangulated hernias are always treated with surgery because a lack of blood supply to the trapped organ or tissue can cause it to die. Surgery to treat a hernia involves pushing the bulge back into place and repairing the weak area of the muscle or abdominal wall. Follow these instructions at home: Activity  Avoid straining.  Do not lift anything that is heavier than 10 lb (4.5 kg), or the limit that you are told, until your health care provider says that it is safe.  When lifting heavy objects, lift with your leg muscles, not your back muscles. Preventing constipation  Take actions to prevent constipation. Constipation leads to straining with bowel movements, which can make a hernia worse or cause a hernia repair to break down. Your health care provider may recommend that you: ? Drink enough fluid to keep your urine pale yellow. ? Eat foods that are high in fiber, such as fresh fruits and vegetables, whole grains, and beans. ? Limit foods that are high in fat and processed sugars, such as fried or sweet foods. ? Take an over-the-counter or  prescription medicine for constipation. General instructions  When coughing, try to cough gently.  You may try to push the hernia back in place by very gently pressing on it while lying down. Do not try to force the bulge back in if it will not push in easily.  If you are overweight, work with your health care provider to lose weight safely.  Do not use any products that contain nicotine or tobacco, such as cigarettes and e-cigarettes. If you need help quitting, ask your health care provider.  If you are scheduled for hernia repair, watch your hernia for any changes in shape, size, or color. Tell your health  care provider about any changes or new symptoms.  Take over-the-counter and prescription medicines only as told by your health care provider.  Keep all follow-up visits as told by your health care provider. This is important. Contact a health care provider if:  You develop new pain, swelling, or redness around your hernia.  You have signs of constipation, such as: ? Fewer bowel movements in a week than normal. ? Difficulty having a bowel movement. ? Stools that are dry, hard, or larger than normal. Get help right away if:  You have a fever.  You have abdomen pain that gets worse.  You feel nauseous or you vomit.  You cannot push the hernia back in place by very gently pressing on it while lying down. Do not try to force the bulge back in if it will not push in easily.  The hernia: ? Changes in shape, size, or color. ? Feels hard or tender. These symptoms may represent a serious problem that is an emergency. Do not wait to see if the symptoms will go away. Get medical help right away. Call your local emergency services (911 in the [place] Summary  A hernia is the bulging of an organ or tissue through a weak spot in the muscles of the abdomen (abdominal wall).  The main symptom is a skin-colored, rounded lump (bulge) in the hernia area. However, a bulge may not always be  present. It may grow bigger or more visible when you cough or strain (such as when having a bowel movement).  A hernia that is small and painless may not need to be treated. A hernia that is large or painful may be treated with surgery.  Surgery to treat a hernia involves pushing the bulge back into place and repairing the weak part of the abdomen. This information is not intended to replace advice given to you by your health care provider. Make sure you discuss any questions you have with your health care provider. Document Released: 06/09/2005 Document Revised: 03/11/2017 Document Reviewed: 03/11/2017 Elsevier Interactive Patient Education  2019 [employer].

## 2018-12-08 NOTE — Anesthesia Preprocedure Evaluation (Addendum)
Anesthesia Evaluation  Patient identified by MRN, date of birth, ID band Patient awake    Reviewed: Allergy & Precautions, H&P , NPO status , Patient's Chart, lab work & pertinent test results  Airway Mallampati: II  TM Distance: >3 FB Neck ROM: Full    Dental no notable dental hx. (+) Teeth Intact, Dental Advisory Given   Pulmonary neg pulmonary ROS,    Pulmonary exam normal breath sounds clear to auscultation       Cardiovascular negative cardio ROS   Rhythm:Regular Rate:Normal     Neuro/Psych  Headaches, Anxiety Depression negative neurological ROS  negative psych ROS   GI/Hepatic negative GI ROS, Neg liver ROS,   Endo/Other  negative endocrine ROS  Renal/GU negative Renal ROS  negative genitourinary   Musculoskeletal   Abdominal   Peds  Hematology negative hematology ROS (+)   Anesthesia Other Findings   Reproductive/Obstetrics negative OB ROS                            Anesthesia Physical Anesthesia Plan  ASA: II  Anesthesia Plan: General   Post-op Pain Management:    Induction: Intravenous  PONV Risk Score and Plan: 4 or greater and Ondansetron, Dexamethasone and Midazolam  Airway Management Planned: Oral ETT  Additional Equipment:   Intra-op Plan:   Post-operative Plan: Extubation in OR  Informed Consent: I have reviewed the patients History and Physical, chart, labs and discussed the procedure including the risks, benefits and alternatives for the proposed anesthesia with the patient or authorized representative who has indicated his/her understanding and acceptance.     Dental advisory given  Plan Discussed with: CRNA  Anesthesia Plan Comments:         Anesthesia Quick Evaluation

## 2018-12-09 ENCOUNTER — Encounter (HOSPITAL_COMMUNITY): Payer: Self-pay | Admitting: Surgery

## 2019-01-12 ENCOUNTER — Other Ambulatory Visit: Payer: Self-pay | Admitting: Physician Assistant

## 2019-01-12 DIAGNOSIS — Z20822 Contact with and (suspected) exposure to covid-19: Secondary | ICD-10-CM

## 2019-01-17 LAB — NOVEL CORONAVIRUS, NAA: SARS-CoV-2, NAA: NOT DETECTED

## 2019-06-15 ENCOUNTER — Ambulatory Visit: Payer: BC Managed Care – PPO | Attending: Internal Medicine

## 2019-06-15 DIAGNOSIS — Z20822 Contact with and (suspected) exposure to covid-19: Secondary | ICD-10-CM

## 2019-06-16 LAB — NOVEL CORONAVIRUS, NAA: SARS-CoV-2, NAA: NOT DETECTED

## 2020-08-24 ENCOUNTER — Other Ambulatory Visit: Payer: Self-pay | Admitting: Physician Assistant

## 2020-08-24 DIAGNOSIS — R1013 Epigastric pain: Secondary | ICD-10-CM

## 2020-09-10 ENCOUNTER — Ambulatory Visit
Admission: RE | Admit: 2020-09-10 | Discharge: 2020-09-10 | Disposition: A | Discharge status: Home / Self Care | Payer: BC Managed Care – PPO | Source: Ambulatory Visit | Attending: Physician Assistant | Admitting: Physician Assistant

## 2020-09-10 DIAGNOSIS — R1013 Epigastric pain: Secondary | ICD-10-CM

## 2021-12-03 ENCOUNTER — Emergency Department (HOSPITAL_COMMUNITY): Payer: BC Managed Care – PPO

## 2021-12-03 ENCOUNTER — Encounter (HOSPITAL_COMMUNITY): Payer: Self-pay | Admitting: Emergency Medicine

## 2021-12-03 ENCOUNTER — Other Ambulatory Visit: Payer: Self-pay

## 2021-12-03 ENCOUNTER — Inpatient Hospital Stay (HOSPITAL_COMMUNITY)
Admission: EM | Admit: 2021-12-03 | Discharge: 2021-12-14 | DRG: 843 | Disposition: A | Discharge status: Home / Self Care | Payer: BC Managed Care – PPO | Source: Ambulatory Visit | Attending: Internal Medicine | Admitting: Internal Medicine

## 2021-12-03 DIAGNOSIS — J188 Other pneumonia, unspecified organism: Secondary | ICD-10-CM | POA: Diagnosis present

## 2021-12-03 DIAGNOSIS — F32A Depression, unspecified: Secondary | ICD-10-CM | POA: Diagnosis present

## 2021-12-03 DIAGNOSIS — Z803 Family history of malignant neoplasm of breast: Secondary | ICD-10-CM

## 2021-12-03 DIAGNOSIS — Z800 Family history of malignant neoplasm of digestive organs: Secondary | ICD-10-CM

## 2021-12-03 DIAGNOSIS — K8020 Calculus of gallbladder without cholecystitis without obstruction: Secondary | ICD-10-CM | POA: Diagnosis present

## 2021-12-03 DIAGNOSIS — J9601 Acute respiratory failure with hypoxia: Secondary | ICD-10-CM | POA: Diagnosis present

## 2021-12-03 DIAGNOSIS — Z79899 Other long term (current) drug therapy: Secondary | ICD-10-CM

## 2021-12-03 DIAGNOSIS — Z885 Allergy status to narcotic agent status: Secondary | ICD-10-CM

## 2021-12-03 DIAGNOSIS — C7B8 Other secondary neuroendocrine tumors: Secondary | ICD-10-CM | POA: Diagnosis not present

## 2021-12-03 DIAGNOSIS — C7800 Secondary malignant neoplasm of unspecified lung: Secondary | ICD-10-CM

## 2021-12-03 DIAGNOSIS — C7A1 Malignant poorly differentiated neuroendocrine tumors: Secondary | ICD-10-CM | POA: Diagnosis present

## 2021-12-03 DIAGNOSIS — F419 Anxiety disorder, unspecified: Secondary | ICD-10-CM | POA: Diagnosis present

## 2021-12-03 DIAGNOSIS — Z818 Family history of other mental and behavioral disorders: Secondary | ICD-10-CM

## 2021-12-03 DIAGNOSIS — R918 Other nonspecific abnormal finding of lung field: Principal | ICD-10-CM

## 2021-12-03 DIAGNOSIS — E871 Hypo-osmolality and hyponatremia: Secondary | ICD-10-CM | POA: Diagnosis present

## 2021-12-03 DIAGNOSIS — C801 Malignant (primary) neoplasm, unspecified: Secondary | ICD-10-CM

## 2021-12-03 DIAGNOSIS — C7801 Secondary malignant neoplasm of right lung: Secondary | ICD-10-CM

## 2021-12-03 DIAGNOSIS — C799 Secondary malignant neoplasm of unspecified site: Secondary | ICD-10-CM | POA: Diagnosis present

## 2021-12-03 DIAGNOSIS — Z1152 Encounter for screening for COVID-19: Secondary | ICD-10-CM

## 2021-12-03 DIAGNOSIS — C7802 Secondary malignant neoplasm of left lung: Secondary | ICD-10-CM

## 2021-12-03 DIAGNOSIS — Z23 Encounter for immunization: Secondary | ICD-10-CM

## 2021-12-03 DIAGNOSIS — J189 Pneumonia, unspecified organism: Secondary | ICD-10-CM

## 2021-12-03 DIAGNOSIS — E790 Hyperuricemia without signs of inflammatory arthritis and tophaceous disease: Secondary | ICD-10-CM | POA: Diagnosis not present

## 2021-12-03 DIAGNOSIS — D630 Anemia in neoplastic disease: Secondary | ICD-10-CM | POA: Diagnosis present

## 2021-12-03 DIAGNOSIS — R197 Diarrhea, unspecified: Secondary | ICD-10-CM | POA: Diagnosis present

## 2021-12-03 DIAGNOSIS — N838 Other noninflammatory disorders of ovary, fallopian tube and broad ligament: Secondary | ICD-10-CM

## 2021-12-03 LAB — CBC
HCT: 43.4 % (ref 36.0–46.0)
Hemoglobin: 14.7 g/dL (ref 12.0–15.0)
MCH: 30.8 pg (ref 26.0–34.0)
MCHC: 33.9 g/dL (ref 30.0–36.0)
MCV: 90.8 fL (ref 80.0–100.0)
Platelets: 439 10*3/uL — ABNORMAL HIGH (ref 150–400)
RBC: 4.78 MIL/uL (ref 3.87–5.11)
RDW: 11.9 % (ref 11.5–15.5)
WBC: 11.9 10*3/uL — ABNORMAL HIGH (ref 4.0–10.5)
nRBC: 0 % (ref 0.0–0.2)

## 2021-12-03 LAB — I-STAT BETA HCG BLOOD, ED (MC, WL, AP ONLY): I-stat hCG, quantitative: <5 m[IU]/mL (ref <5)

## 2021-12-03 LAB — BASIC METABOLIC PANEL
Anion gap: 13 (ref 5–15)
BUN: 11 mg/dL (ref 6–20)
CO2: 21 mmol/L — ABNORMAL LOW (ref 22–32)
Calcium: 10 mg/dL (ref 8.9–10.3)
Chloride: 100 mmol/L (ref 98–111)
Creatinine, Ser: 0.7 mg/dL (ref 0.44–1.00)
GFR, Estimated: >60 mL/min (ref >60)
Glucose, Bld: 99 mg/dL (ref 70–99)
Potassium: 4.3 mmol/L (ref 3.5–5.1)
Sodium: 134 mmol/L — ABNORMAL LOW (ref 135–145)

## 2021-12-03 LAB — LACTIC ACID, PLASMA: Lactic Acid, Venous: 1.7 mmol/L (ref 0.5–1.9)

## 2021-12-03 NOTE — ED Provider Triage Note (Signed)
Emergency Medicine Provider Triage Evaluation Note  Melanie Ramirez , a 41 y.o. female  was evaluated in triage.  Pt complains of shortness of breath.  Patient was recently diagnosed with pneumonia, is already finished a course of levofloxacin.  She was at her primary doctor today for reevaluation, and was persistently tachypneic with decreased oxygen saturation.  They tried to obtain some blood work to get a CT of her chest, and she almost had a syncopal episode.  Review of Systems  Positive: Shortness of breath, cough, dyspnea on exertion Negative: Fever, CP  Physical Exam  BP (!) 151/89 (BP Location: Right Arm)   Pulse 91   Temp 98.1 F (36.7 C) (Oral)   Resp (!) 32   LMP 11/22/2021   SpO2 94%  Gen:   Awake, no distress   Resp:  Normal effort  MSK:   Moves extremities without difficulty  Other:    Medical Decision Making  Medically screening exam initiated at 3:08 PM.  Appropriate orders placed.  Melanie Ramirez was informed that the remainder of the evaluation will be completed by another provider, this initial triage assessment does not replace that evaluation, and the importance of remaining in the ED until their evaluation is complete.  Will order basic lab work and continue with PCP orders with CT chest w/o   Melanie Ramirez T, PA-C 12/03/21 1512

## 2021-12-03 NOTE — ED Provider Notes (Signed)
[facility] EMERGENCY DEPARTMENT Provider Note   CSN: [id number] Arrival date & time: 12/03/21  1442     History {Add pertinent medical, surgical, social history, OB history to HPI:1} Chief Complaint  Patient presents with   Shortness of Breath    Melanie Ramirez is a 41 y.o. female.  The history is provided by the patient and medical records.  Shortness of Breath Associated symptoms: cough    41 year old female with history of anxiety, headaches, presenting to the ED with shortness of breath.  For the past 3 weeks she has had dry cough and shortness of breath.  She was treated for multifocal pneumonia with 7-day course of levofloxacin and 5 days of IM Rocephin.  She returned to clinic today due to persistent symptoms and attempted to get labs for CT with contrast, however had a vagal episode and was sent to the ED for further evaluation.  She reports cough remains dry, mostly nonproductive.  Symptoms seem worse when trying to lay flat and feels like she cannot catch her breath.  She has been sleeping mostly sitting upright.  She has had about an 8 pound unintentional weight loss despite eating and drinking normally.  There is family history of breast cancer, no history of lung cancer, lymphoma, or similar.  Home Medications Prior to Admission medications   Medication Sig Start Date End Date Taking? Authorizing Provider  buPROPion (WELLBUTRIN XL) 150 MG 24 hr tablet Take 150 mg by mouth daily.    [provider]  citalopram (CELEXA) 40 MG tablet Take 40 mg by mouth daily.     [provider]  Magnesium 300 MG CAPS Take 300 mg by mouth every evening.    [provider]  Multiple Vitamins-Minerals (MULTIVITAMIN WITH MINERALS) tablet Take 1 tablet by mouth daily.    [provider]  traMADol (ULTRAM) 50 MG tablet Take 1-2 tablets (50-100 mg total) by mouth every 6 (six) hours as needed for moderate pain or severe pain. 12/08/18   Michael Boston, MD      Allergies    Oxycodone    Review of Systems   Review of Systems  Constitutional:  Positive for unexpected weight change.  Respiratory:  Positive for cough and shortness of breath.   All other systems reviewed and are negative.   Physical Exam Updated Vital Signs BP 138/78   Pulse (!) 117   Temp 98.1 F (36.7 C) (Oral)   Resp 16   LMP 11/22/2021   SpO2 93%  Physical Exam Vitals and nursing note reviewed.  Constitutional:      Appearance: She is well-developed.  HENT:     Head: Normocephalic and atraumatic.  Eyes:     Conjunctiva/sclera: Conjunctivae normal.     Pupils: Pupils are equal, round, and reactive to light.  Cardiovascular:     Rate and Rhythm: Normal rate and regular rhythm.     Heart sounds: Normal heart sounds.  Pulmonary:     Effort: Pulmonary effort is normal.     Breath sounds: Normal breath sounds. No wheezing or rhonchi.  Abdominal:     General: Bowel sounds are normal.     Palpations: Abdomen is soft.  Musculoskeletal:        General: Normal range of motion.     Cervical back: Normal range of motion.  Skin:    General: Skin is warm and dry.  Neurological:     Mental Status: She is alert and oriented to person, place,  and time.     ED Results / Procedures / Treatments   Labs (all labs ordered are listed, but only abnormal results are displayed) Labs Reviewed  CBC - Abnormal; Notable for the following components:      Result Value   WBC 11.9 (*)    Platelets 439 (*)    All other components within normal limits  BASIC METABOLIC PANEL - Abnormal; Notable for the following components:   Sodium 134 (*)    CO2 21 (*)    All other components within normal limits  LACTIC ACID, PLASMA  LACTIC ACID, PLASMA  HEPATIC FUNCTION PANEL  LIPASE, BLOOD  I-STAT BETA HCG BLOOD, ED ([facility], [facility], [facility] ONLY)    EKG None  Radiology CT Chest Wo Contrast  Result Date: 12/03/2021 CLINICAL DATA:  Pneumonia suspected; * Tracking Code: BO * EXAM:  CT CHEST WITHOUT CONTRAST TECHNIQUE: Multidetector CT imaging of the chest was performed following the standard protocol without IV contrast. RADIATION DOSE REDUCTION: This exam was performed according to the departmental dose-optimization program which includes automated exposure control, adjustment of the mA and/or kV according to patient size and/or use of iterative reconstruction technique. COMPARISON:  Radiograph dated November 26, 2021 FINDINGS: Cardiovascular: Normal heart size. Trace pericardial fluid. No coronary artery calcifications. Normal caliber thoracic aorta with no atherosclerotic disease. Mediastinum/Nodes: Esophagus and thyroid are unremarkable. Bulky enlarged mediastinal and bilateral hilar lymph nodes. Reference right upper paratracheal lymph node measuring 2.9 cm in short axis on series 3, image 19. Reference AP window lymph node measuring 2.7 cm in short axis on image 25. Reference right hilar lymph node measuring 2.5 cm in short axis on series 3, image 29. Reference left hilar lymph node measuring 2.4 cm in short axis on image 77. Lungs/Pleura: Central airways are patent. Innumerable bilateral solid pulmonary nodules which are most prominent in the lower lungs reference right middle lobe pulmonary nodule measuring 2.5 x 1.9 cm on series 4, image 92. Reference left lower lobe pulmonary nodule measuring 2.1 x 1.6 cm on image 113. Perifissural/subpleural nodularity and interlobular septal thickening. Small right pleural effusion. Upper Abdomen: Enlarged upper abdominal lymph nodes. Reference gastrohepatic ligament lymph node measuring 1.5 cm in short axis on series 2, image 60. Musculoskeletal: No chest wall mass or suspicious bone lesions identified. IMPRESSION: 1. Innumerable bilateral solid pulmonary nodules which are most prominent in the lower lungs, findings are concerning for metastatic disease. 2. 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 3. Bulky mediastinal bilateral hilar lymph nodes, likely due to nodal metastatic disease. 4. Enlarged lymph nodes of the upper abdomen, concerning for additional site of nodal disease. 5. Small left pleural effusion. Electronically Signed   By: Yetta Glassman M.D.   On: 12/03/2021 16:46    Procedures Procedures  {Document cardiac monitor, telemetry assessment procedure when appropriate:1}  Medications Ordered in ED Medications - No data to display  ED Course/ Medical Decision Making/ A&P                           Medical Decision Making Amount and/or Complexity of Data Reviewed Labs: ordered. Radiology: ordered.  Risk Decision regarding hospitalization.   ***  {Document critical care time when appropriate:1} {Document review of labs and clinical decision tools ie heart score, Chads2Vasc2 etc:1}  {Document your independent review of radiology images, and any outside records:1} {Document your discussion with family members, caretakers, and with consultants:1} {Document social determinants of health  affecting pt's care:1} {Document your decision making why or why not admission, treatments were needed:1} Final Clinical Impression(s) / ED Diagnoses Final diagnoses:  Lung mass    Rx / DC Orders ED Discharge Orders     None

## 2021-12-03 NOTE — ED Triage Notes (Addendum)
Pt states she has had pneumonia since June 3rd and feels very SOB. Pt finished her dose of antibiotics today. Pt went to her MD and was told to come here. Pt's sats standing at her MD office on room air were high 80's. Pt tachypnic even at rest.

## 2021-12-04 ENCOUNTER — Inpatient Hospital Stay: Payer: Self-pay

## 2021-12-04 ENCOUNTER — Inpatient Hospital Stay (HOSPITAL_COMMUNITY): Payer: BC Managed Care – PPO

## 2021-12-04 DIAGNOSIS — F419 Anxiety disorder, unspecified: Secondary | ICD-10-CM | POA: Diagnosis present

## 2021-12-04 DIAGNOSIS — Z800 Family history of malignant neoplasm of digestive organs: Secondary | ICD-10-CM | POA: Diagnosis not present

## 2021-12-04 DIAGNOSIS — N838 Other noninflammatory disorders of ovary, fallopian tube and broad ligament: Secondary | ICD-10-CM | POA: Diagnosis not present

## 2021-12-04 DIAGNOSIS — D630 Anemia in neoplastic disease: Secondary | ICD-10-CM | POA: Diagnosis present

## 2021-12-04 DIAGNOSIS — Z803 Family history of malignant neoplasm of breast: Secondary | ICD-10-CM | POA: Diagnosis not present

## 2021-12-04 DIAGNOSIS — C801 Malignant (primary) neoplasm, unspecified: Secondary | ICD-10-CM | POA: Diagnosis not present

## 2021-12-04 DIAGNOSIS — R590 Localized enlarged lymph nodes: Secondary | ICD-10-CM | POA: Diagnosis not present

## 2021-12-04 DIAGNOSIS — Z23 Encounter for immunization: Secondary | ICD-10-CM | POA: Diagnosis not present

## 2021-12-04 DIAGNOSIS — C7B8 Other secondary neuroendocrine tumors: Secondary | ICD-10-CM | POA: Diagnosis present

## 2021-12-04 DIAGNOSIS — F32A Depression, unspecified: Secondary | ICD-10-CM

## 2021-12-04 DIAGNOSIS — R197 Diarrhea, unspecified: Secondary | ICD-10-CM | POA: Diagnosis present

## 2021-12-04 DIAGNOSIS — J9601 Acute respiratory failure with hypoxia: Secondary | ICD-10-CM | POA: Diagnosis present

## 2021-12-04 DIAGNOSIS — J188 Other pneumonia, unspecified organism: Secondary | ICD-10-CM | POA: Diagnosis present

## 2021-12-04 DIAGNOSIS — C7A1 Malignant poorly differentiated neuroendocrine tumors: Secondary | ICD-10-CM | POA: Diagnosis present

## 2021-12-04 DIAGNOSIS — Z1152 Encounter for screening for COVID-19: Secondary | ICD-10-CM | POA: Diagnosis not present

## 2021-12-04 DIAGNOSIS — Z79899 Other long term (current) drug therapy: Secondary | ICD-10-CM | POA: Diagnosis not present

## 2021-12-04 DIAGNOSIS — E871 Hypo-osmolality and hyponatremia: Secondary | ICD-10-CM | POA: Diagnosis present

## 2021-12-04 DIAGNOSIS — J189 Pneumonia, unspecified organism: Secondary | ICD-10-CM | POA: Diagnosis not present

## 2021-12-04 DIAGNOSIS — C799 Secondary malignant neoplasm of unspecified site: Secondary | ICD-10-CM | POA: Diagnosis present

## 2021-12-04 DIAGNOSIS — R918 Other nonspecific abnormal finding of lung field: Secondary | ICD-10-CM | POA: Diagnosis not present

## 2021-12-04 DIAGNOSIS — Z885 Allergy status to narcotic agent status: Secondary | ICD-10-CM | POA: Diagnosis not present

## 2021-12-04 DIAGNOSIS — Z818 Family history of other mental and behavioral disorders: Secondary | ICD-10-CM | POA: Diagnosis not present

## 2021-12-04 DIAGNOSIS — K8020 Calculus of gallbladder without cholecystitis without obstruction: Secondary | ICD-10-CM | POA: Diagnosis present

## 2021-12-04 DIAGNOSIS — C7800 Secondary malignant neoplasm of unspecified lung: Secondary | ICD-10-CM | POA: Diagnosis not present

## 2021-12-04 DIAGNOSIS — E790 Hyperuricemia without signs of inflammatory arthritis and tophaceous disease: Secondary | ICD-10-CM | POA: Diagnosis not present

## 2021-12-04 DIAGNOSIS — C7802 Secondary malignant neoplasm of left lung: Secondary | ICD-10-CM | POA: Diagnosis not present

## 2021-12-04 DIAGNOSIS — C7801 Secondary malignant neoplasm of right lung: Secondary | ICD-10-CM | POA: Diagnosis not present

## 2021-12-04 LAB — CBC WITH DIFFERENTIAL/PLATELET
Abs Immature Granulocytes: 0.3 10*3/uL — ABNORMAL HIGH (ref 0.00–0.07)
Basophils Absolute: 0 10*3/uL (ref 0.0–0.1)
Basophils Relative: 0 %
Eosinophils Absolute: 0 10*3/uL (ref 0.0–0.5)
Eosinophils Relative: 0 %
HCT: 38.8 % (ref 36.0–46.0)
Hemoglobin: 13.3 g/dL (ref 12.0–15.0)
Immature Granulocytes: 2 %
Lymphocytes Relative: 8 %
Lymphs Abs: 1.2 10*3/uL (ref 0.7–4.0)
MCH: 30.9 pg (ref 26.0–34.0)
MCHC: 34.3 g/dL (ref 30.0–36.0)
MCV: 90 fL (ref 80.0–100.0)
Monocytes Absolute: 1.7 10*3/uL — ABNORMAL HIGH (ref 0.1–1.0)
Monocytes Relative: 12 %
Neutro Abs: 11 10*3/uL — ABNORMAL HIGH (ref 1.7–7.7)
Neutrophils Relative %: 78 %
Platelets: 365 10*3/uL (ref 150–400)
RBC: 4.31 MIL/uL (ref 3.87–5.11)
RDW: 11.9 % (ref 11.5–15.5)
WBC: 14.3 10*3/uL — ABNORMAL HIGH (ref 4.0–10.5)
nRBC: 0 % (ref 0.0–0.2)

## 2021-12-04 LAB — COMPREHENSIVE METABOLIC PANEL
ALT: 24 U/L (ref 0–44)
AST: 45 U/L — ABNORMAL HIGH (ref 15–41)
Albumin: 2.9 g/dL — ABNORMAL LOW (ref 3.5–5.0)
Alkaline Phosphatase: 95 U/L (ref 38–126)
Anion gap: 11 (ref 5–15)
BUN: 12 mg/dL (ref 6–20)
CO2: 20 mmol/L — ABNORMAL LOW (ref 22–32)
Calcium: 9.5 mg/dL (ref 8.9–10.3)
Chloride: 101 mmol/L (ref 98–111)
Creatinine, Ser: 0.74 mg/dL (ref 0.44–1.00)
GFR, Estimated: >60 mL/min (ref >60)
Glucose, Bld: 109 mg/dL — ABNORMAL HIGH (ref 70–99)
Potassium: 4.9 mmol/L (ref 3.5–5.1)
Sodium: 132 mmol/L — ABNORMAL LOW (ref 135–145)
Total Bilirubin: 0.7 mg/dL (ref 0.3–1.2)
Total Protein: 6.5 g/dL (ref 6.5–8.1)

## 2021-12-04 LAB — MRSA NEXT GEN BY PCR, NASAL: MRSA by PCR Next Gen: NOT DETECTED

## 2021-12-04 LAB — HEPATIC FUNCTION PANEL
ALT: 28 U/L (ref 0–44)
AST: 46 U/L — ABNORMAL HIGH (ref 15–41)
Albumin: 3.3 g/dL — ABNORMAL LOW (ref 3.5–5.0)
Alkaline Phosphatase: 107 U/L (ref 38–126)
Bilirubin, Direct: <0.1 mg/dL (ref 0.0–0.2)
Total Bilirubin: 0.5 mg/dL (ref 0.3–1.2)
Total Protein: 7.4 g/dL (ref 6.5–8.1)

## 2021-12-04 LAB — PROTIME-INR
INR: 1.1 (ref 0.8–1.2)
Prothrombin Time: 14.4 seconds (ref 11.4–15.2)

## 2021-12-04 LAB — LIPASE, BLOOD: Lipase: 20 U/L (ref 11–51)

## 2021-12-04 LAB — HIV ANTIBODY (ROUTINE TESTING W REFLEX): HIV Screen 4th Generation wRfx: NONREACTIVE

## 2021-12-04 MED ORDER — SODIUM CHLORIDE 0.9% FLUSH
3.0000 mL | Freq: Two times a day (BID) | INTRAVENOUS | Status: DC
  Administered 2021-12-04 – 2021-12-06 (×5): 3 mL via INTRAVENOUS

## 2021-12-04 MED ORDER — CITALOPRAM HYDROBROMIDE 20 MG PO TABS
40.0000 mg | ORAL_TABLET | Freq: Every day | ORAL | Status: DC
  Administered 2021-12-04 – 2021-12-14 (×11): 40 mg via ORAL
  Filled 2021-12-04: qty 2
  Filled 2021-12-04: qty 4
  Filled 2021-12-04 (×9): qty 2

## 2021-12-04 MED ORDER — IPRATROPIUM-ALBUTEROL 0.5-2.5 (3) MG/3ML IN SOLN
3.0000 mL | Freq: Four times a day (QID) | RESPIRATORY_TRACT | Status: DC | PRN
  Administered 2021-12-04 – 2021-12-05 (×4): 3 mL via RESPIRATORY_TRACT
  Filled 2021-12-04 (×4): qty 3

## 2021-12-04 MED ORDER — SODIUM CHLORIDE 0.9 % IV SOLN
500.0000 mg | INTRAVENOUS | Status: DC
  Administered 2021-12-04 – 2021-12-05 (×2): 500 mg via INTRAVENOUS
  Filled 2021-12-04 (×3): qty 5

## 2021-12-04 MED ORDER — IOHEXOL 350 MG/ML SOLN
100.0000 mL | Freq: Once | INTRAVENOUS | Status: AC | PRN
Start: 2021-12-04 — End: 2021-12-04
  Administered 2021-12-04: 60 mL via INTRAVENOUS

## 2021-12-04 MED ORDER — ACETAMINOPHEN 325 MG PO TABS
650.0000 mg | ORAL_TABLET | Freq: Four times a day (QID) | ORAL | Status: DC | PRN
  Administered 2021-12-10: 650 mg via ORAL
  Filled 2021-12-04: qty 2

## 2021-12-04 MED ORDER — ONDANSETRON HCL 4 MG PO TABS: 4.0000 mg | ORAL_TABLET | Freq: Four times a day (QID) | ORAL | Status: DC | PRN

## 2021-12-04 MED ORDER — ACETAMINOPHEN 650 MG RE SUPP: 650.0000 mg | Freq: Four times a day (QID) | RECTAL | Status: DC | PRN

## 2021-12-04 MED ORDER — SENNOSIDES-DOCUSATE SODIUM 8.6-50 MG PO TABS: 1.0000 | ORAL_TABLET | Freq: Every evening | ORAL | Status: DC | PRN

## 2021-12-04 MED ORDER — SODIUM CHLORIDE 0.9% FLUSH
10.0000 mL | Freq: Two times a day (BID) | INTRAVENOUS | Status: DC
  Administered 2021-12-05 – 2021-12-06 (×3): 10 mL

## 2021-12-04 MED ORDER — SODIUM CHLORIDE 0.9% FLUSH: 10.0000 mL | INTRAVENOUS | Status: DC | PRN

## 2021-12-04 MED ORDER — KETOROLAC TROMETHAMINE 30 MG/ML IJ SOLN
30.0000 mg | Freq: Four times a day (QID) | INTRAMUSCULAR | Status: DC | PRN
  Administered 2021-12-04 – 2021-12-06 (×5): 30 mg via INTRAVENOUS
  Filled 2021-12-04 (×5): qty 1

## 2021-12-04 MED ORDER — ENOXAPARIN SODIUM 40 MG/0.4ML IJ SOSY
40.0000 mg | PREFILLED_SYRINGE | INTRAMUSCULAR | Status: DC
  Administered 2021-12-04 – 2021-12-14 (×11): 40 mg via SUBCUTANEOUS
  Filled 2021-12-04 (×11): qty 0.4

## 2021-12-04 MED ORDER — ONDANSETRON HCL 4 MG/2ML IJ SOLN
4.0000 mg | Freq: Four times a day (QID) | INTRAMUSCULAR | Status: DC | PRN
  Administered 2021-12-10 – 2021-12-14 (×5): 4 mg via INTRAVENOUS
  Filled 2021-12-04 (×6): qty 2

## 2021-12-04 MED ORDER — TRAZODONE HCL 50 MG PO TABS
50.0000 mg | ORAL_TABLET | Freq: Every day | ORAL | Status: DC
  Administered 2021-12-04 – 2021-12-13 (×10): 50 mg via ORAL
  Filled 2021-12-04 (×11): qty 1

## 2021-12-04 MED ORDER — DM-GUAIFENESIN ER 30-600 MG PO TB12
1.0000 | ORAL_TABLET | Freq: Two times a day (BID) | ORAL | Status: DC
  Administered 2021-12-04 – 2021-12-14 (×21): 1 via ORAL
  Filled 2021-12-04 (×22): qty 1

## 2021-12-04 MED ORDER — CHLORHEXIDINE GLUCONATE CLOTH 2 % EX PADS
6.0000 | MEDICATED_PAD | Freq: Every day | CUTANEOUS | Status: DC
  Administered 2021-12-05 – 2021-12-06 (×2): 6 via TOPICAL

## 2021-12-04 MED ORDER — LORAZEPAM 2 MG/ML IJ SOLN
1.0000 mg | Freq: Four times a day (QID) | INTRAMUSCULAR | Status: DC | PRN
  Administered 2021-12-04 – 2021-12-13 (×11): 1 mg via INTRAVENOUS
  Filled 2021-12-04 (×12): qty 1

## 2021-12-04 MED ORDER — MELATONIN 3 MG PO TABS
3.0000 mg | ORAL_TABLET | Freq: Every day | ORAL | Status: DC
  Administered 2021-12-04 – 2021-12-13 (×9): 3 mg via ORAL
  Filled 2021-12-04 (×10): qty 1

## 2021-12-04 MED ORDER — BUPROPION HCL ER (XL) 300 MG PO TB24
300.0000 mg | ORAL_TABLET | Freq: Every day | ORAL | Status: DC
  Administered 2021-12-04 – 2021-12-14 (×11): 300 mg via ORAL
  Filled 2021-12-04 (×11): qty 1

## 2021-12-04 MED ORDER — SODIUM CHLORIDE 0.9 % IV SOLN
2.0000 g | INTRAVENOUS | Status: DC
  Administered 2021-12-04 – 2021-12-06 (×3): 2 g via INTRAVENOUS
  Filled 2021-12-04 (×4): qty 20

## 2021-12-04 MED ORDER — SODIUM CHLORIDE 0.9 % IV SOLN: INTRAVENOUS | Status: AC

## 2021-12-04 MED ORDER — ALBUTEROL SULFATE (2.5 MG/3ML) 0.083% IN NEBU
2.5000 mg | INHALATION_SOLUTION | RESPIRATORY_TRACT | Status: DC | PRN
Start: 2021-12-04 — End: 2021-12-09

## 2021-12-04 MED ORDER — IOHEXOL 300 MG/ML  SOLN
80.0000 mL | Freq: Once | INTRAMUSCULAR | Status: AC | PRN
  Administered 2021-12-04: 80 mL via INTRAVENOUS

## 2021-12-04 MED ORDER — SODIUM CHLORIDE 0.9 % IV BOLUS
1000.0000 mL | Freq: Once | INTRAVENOUS | Status: AC
  Administered 2021-12-04: 1000 mL via INTRAVENOUS

## 2021-12-04 NOTE — Plan of Care (Signed)
  Problem: Pain Managment: Goal: General experience of comfort will improve Outcome: Progressing   Problem: Safety: Goal: Ability to remain free from injury will improve Outcome: Progressing   

## 2021-12-04 NOTE — Progress Notes (Signed)
Peripherally Inserted Central Catheter Placement  The IV Nurse has discussed with the patient and/or persons authorized to consent for the patient, the purpose of this procedure and the potential benefits and risks involved with this procedure.  The benefits include less needle sticks, lab draws from the catheter, and the patient may be discharged home with the catheter. Risks include, but not limited to, infection, bleeding, blood clot (thrombus formation), and puncture of an artery; nerve damage and irregular heartbeat and possibility to perform a PICC exchange if needed/ordered by physician.  Alternatives to this procedure were also discussed.  Bard Power PICC patient education guide, fact sheet on infection prevention and patient information card has been provided to patient /or left at bedside.    PICC Placement Documentation  PICC Single Lumen 74/12/87 Right Basilic 38 cm 0 cm (Active)  Indication for Insertion or Continuance of Line Prolonged intravenous therapies 12/04/21 2115  Exposed Catheter (cm) 0 cm 12/04/21 2115  Site Assessment Clean, Dry, Intact 12/04/21 2115  Line Status Flushed;Saline locked;Blood return noted 12/04/21 2115  Dressing Type Transparent;Securing device 12/04/21 2115  Dressing Status Antimicrobial disc in place;Clean, Dry, Intact 12/04/21 2115  Dressing Intervention New dressing 12/04/21 2115  Dressing Change Due 12/11/21 12/04/21 2115       Shon Hale 12/04/2021, 9:43 PM

## 2021-12-04 NOTE — Consult Note (Signed)
NAME:  Melanie Ramirez, MRN:  [id number], DOB:  11/07/1980, LOS: 0 ADMISSION DATE:  12/03/2021, CONSULTATION DATE:  12/04/21 REFERRING MD:  Aileen Fass CHIEF COMPLAINT:  Dyspnea, cough   History of Present Illness:  Melanie Ramirez is a 41 y.o. female who has a PMH as below. She presented to [facility] ED 6/14 with ongoing dyspnea and cough. Symptoms first started June 3 and she went to urgent care walk in clinic on 6/4 where CXR suggested infectious process / PNA. She was treated with IM Rocephin and PO Levaquin.  During that time frame she also experienced roughly 8 pound weight loss. She attributed it to decreased PO intake with the antibiotics. She has also been training for a half marathon, therefore, didn't think much of it.  In ED, she was hypoxic and was placed on venturi mask. 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 CTA chest was later obtained due to tachycardia, hypoxia, dyspnea and this was negative for PE.  Images were reviewed by IR who recommended pulmonary consultation for consideration of bronchoscopy/EBUS for tissue sampling.  Pt has no personal or family hx of malignancy. She has been feeling well besides for current episode of dyspnea and cough and as mentioned above, she was actively training for a half marathon.  Pertinent  Medical History:  has Normal labor; Abnormality of forces of labor; SVD (spontaneous vaginal delivery); Postpartum care following vaginal delivery (12/25); Anxiety; Sprain of right ankle; Anxiety and depression; Supraumbilical hernia; Umbilical hernia; Acute respiratory failure with hypoxia ([place]); and Metastatic disease ([place]) on their problem list.  Significant Hospital  Events: Including procedures, antibiotic start and stop dates in addition to other pertinent events   6/13 admit. 6/14 pulm consult  Interim History / Subjective:  Understandably anxious. Tachy but stable. Husband at bedside.  Objective:  Blood pressure 133/82, pulse (!) 106, temperature 98.1 F (36.7 C), temperature source Oral, resp. rate (!) 29, last menstrual period 11/22/2021, SpO2 96 %, unknown if currently breastfeeding.       No intake or output data in the 24 hours ending 12/04/21 1134 There were no vitals filed for this visit.  Examination: General: Young adult female, resting in bed, in NAD. Neuro: A&O x 3, no deficits. MAE. HEENT: [place]/AT. Sclerae anicteric. EOMI. Cardiovascular: Tachy, regular, no M/R/G.  Lungs: Respirations even and unlabored.  CTA bilaterally, No W/R/R. Abdomen: BS x 4, soft, NT/ND.  Musculoskeletal: No gross deformities, no edema.  Skin: Intact, warm, no rashes.   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  CTA chest 6/14 > negative for PE.  Assessment & Plan:   Suspected widely metastatic disease - unclear primary site at this point; however, imaging reveals multiple / innumerable pulmonary nodules with findings concerning for metastatic disease and lymphangitic carcinomatosis along with large left ovarian mass with multiple enlarged lymph nodes. - Case discussed with IR and best first step is felt to be bronchoscopy with EBUS for tissue sampling. This is scheduled for 1pm Thursday 6/15. - If above is non-diagnostic, then IR to proceed with CT guided  lung or omental biopsy. - NPO after midnight for above. - Primary team to consult oncology.  Anxiety - 2/2 above along with baseline  hx of Anxiety/Depression. - Emotional support and encouragement provided to pt and spouse at bedside. - Continue PRN Ativan. - Continue home Bupropion / Citalopram.   Rest per primary team.  Best practice (evaluated daily):  Per primary team.  Labs   CBC: Recent Labs  Lab 12/03/21 1525 12/04/21 0500  WBC 11.9* 14.3*  NEUTROABS  --  11.0*  HGB 14.7 13.3  HCT 43.4 38.8  MCV 90.8 90.0  PLT 439* 981    Basic Metabolic Panel: Recent Labs  Lab 12/03/21 1525 12/04/21 0500  NA 134* 132*  K 4.3 4.9  CL 100 101  CO2 21* 20*  GLUCOSE 99 109*  BUN 11 12  CREATININE 0.70 0.74  CALCIUM 10.0 9.5   GFR: CrCl cannot be calculated (Unknown ideal weight.). Recent Labs  Lab 12/03/21 1513 12/03/21 1525 12/04/21 0500  WBC  --  11.9* 14.3*  LATICACIDVEN 1.7  --   --     Liver Function Tests: Recent Labs  Lab 12/03/21 2346 12/04/21 0500  AST 46* 45*  ALT 28 24  ALKPHOS 107 95  BILITOT 0.5 0.7  PROT 7.4 6.5  ALBUMIN 3.3* 2.9*   Recent Labs  Lab 12/03/21 2346  LIPASE 20   No results for input(s): "AMMONIA" in the last 168 hours.  ABG No results found for: "PHART", "PCO2ART", "PO2ART", "HCO3", "TCO2", "ACIDBASEDEF", "O2SAT"   Coagulation Profile: No results for input(s): "INR", "PROTIME" in the last 168 hours.  Cardiac Enzymes: No results for input(s): "CKTOTAL", "CKMB", "CKMBINDEX", "TROPONINI" in the last 168 hours.  HbA1C: No results found for: "HGBA1C"  CBG: No results for input(s): "GLUCAP" in the last 168 hours.  Review of Systems:   All negative; except for those that are bolded, which indicate positives.  Constitutional: weight loss, weight gain, night sweats, fevers, chills, fatigue, weakness.  HEENT: headaches, sore throat, sneezing, nasal congestion, post nasal drip, difficulty swallowing, tooth/dental problems, visual complaints, visual changes, ear aches. Neuro: difficulty with speech, weakness, numbness, ataxia. CV:  chest pain, orthopnea,  PND, swelling in lower extremities, dizziness, palpitations, syncope.  Resp: cough, hemoptysis, dyspnea, wheezing. GI: heartburn, indigestion, abdominal pain, nausea, vomiting, diarrhea, constipation, change in bowel habits, loss of appetite, hematemesis, melena, hematochezia.  GU: dysuria, change in color of urine, urgency or frequency, flank pain, hematuria. MSK: joint pain or swelling, decreased range of motion. Psych: change in mood or affect, depression, anxiety, suicidal ideations, homicidal ideations. Skin: rash, itching, bruising.   Past Medical History:  She,  has a past medical history of Active labor (03/21/2011), Anxiety, Depression, Headache(784.0), Pneumonia, and Postpartum care following vaginal delivery (9/28) (03/21/2011).   Surgical History:   Past Surgical History:  Procedure Laterality Date   DENTAL SURGERY     DILATION AND EVACUATION N/A 07/29/2013   Procedure: Ultrasound guided Suction DILATATION AND EVACUATION (D&E) 2ND TRIMESTER with tissue sent for chromosome analysis;  Surgeon: Lovenia Kim, MD;  Location: [facility];  Service: Gynecology;  Laterality: N/A;   FOOT SURGERY Left 2018   calcaneus   VENTRAL HERNIA REPAIR N/A 12/08/2018   Procedure: LAPAROSCOPIC VENTRAL WALL HERNIA REPAIR WITH MESH;  Surgeon: Michael Boston, MD;  Location: [facility];  Service: General;  Laterality: N/A;     Social History:   reports that she has never smoked. She has never used smokeless tobacco. She reports current alcohol use  of about 1.0 standard drink of alcohol per week. She reports that she does not use drugs.   Family History:  Her family history includes Depression in her brother, daughter, father, mother, sister, and son.   Allergies Allergies  Allergen Reactions   Oxycodone     Had poor tolerance:unable to sleep &  mild hallucination     Home Medications  Prior to Admission medications   Medication Sig Start Date End Date Taking? Authorizing Provider  acetaminophen  (TYLENOL) 500 MG tablet Take 1,000 mg by mouth every 6 (six) hours as needed for moderate pain or headache.   Yes [provider]  albuterol (VENTOLIN HFA) 108 (90 Base) MCG/ACT inhaler Inhale 2 puffs into the lungs every 4 (four) hours as needed for wheezing or shortness of breath. 11/21/21  Yes [provider]  buPROPion (WELLBUTRIN XL) 300 MG 24 hr tablet Take 300 mg by mouth daily. 11/24/21  Yes [provider]  citalopram (CELEXA) 40 MG tablet Take 40 mg by mouth daily.    Yes [provider]  ibuprofen (ADVIL) 200 MG tablet Take 800 mg by mouth every 6 (six) hours as needed for headache, mild pain or fever.   Yes [provider]  Magnesium 300 MG CAPS Take 300 mg by mouth every evening.   Yes [provider]  Multiple Vitamins-Minerals (MULTIVITAMIN WITH MINERALS) tablet Take 1 tablet by mouth daily.   Yes [provider]      Montey Hora, [place] For pager details, please see AMION or use Epic chat  After 1900, please call [facility] for cross coverage needs 12/04/2021, 11:34 AM

## 2021-12-04 NOTE — Assessment & Plan Note (Deleted)
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  Findings highly concerning for metastatic disease of unknown primary. -Follow CT abdomen/pelvis -Oncology consult in a.m.

## 2021-12-04 NOTE — Consult Note (Signed)
Reason for the request:    Advanced malignancy  HPI: I was asked by Dr. Aileen Fass to evaluate Melanie Ramirez for evaluation of metastatic malignancy.  She is a 41 year old woman without any significant comorbid condition presented with symptoms of shortness of breath and difficulty breathing that has been going on for the last 3 to 4 weeks.  She was evaluated at the urgent care and a chest x-ray suggested an infectious process and was started on Rocephin and subsequently Levaquin.  She continues to have increasing shortness of breath and dyspnea lost 8 pounds.  She subsequently was evaluated in the emergency department and CT scan of the chest obtained showed innumerable nodules concerning for metastatic disease as well as potential lymphangitic carcinomatosis as well as nodal metastasis.  CT scan of the abdomen and pelvis was obtained which revealed a large left ovarian mass as well as abdominal and pelvic adenopathy.  Her CA125 is currently pending.  She was evaluated by pulmonary medicine and scheduled to have an endoscopy and a biopsy on 615.  Clinically, she is currently receiving adequate oxygenation via facemask.  She is dyspneic but overall comfortable.  She denies any abdominal pain, weight loss appetite changes.  She had to previous pregnancies were uncomplicated.  She denies any family history of malignancy.   She does not report any headaches, blurry vision, syncope or seizures. Does not report any fevers, chills or sweats.  Does not report any cough, wheezing or hemoptysis.  Does not report any chest pain, palpitation, orthopnea or leg edema.  Does not report any nausea, vomiting or abdominal pain.  Does not report any constipation or diarrhea.  Does not report any skeletal complaints.    Does not report frequency, urgency or hematuria.  Does not report any skin rashes or lesions. Does not report any heat or cold intolerance.  Does not report any lymphadenopathy or petechiae.  Does not report any  anxiety or depression.  Remaining review of systems is negative.     Past Medical History:  Diagnosis Date   Active labor 03/21/2011   Anxiety    Depression    Headache(784.0)    occ. migraines with periods   Pneumonia    as teenager   Postpartum care following vaginal delivery (9/28) 03/21/2011  :   Past Surgical History:  Procedure Laterality Date   DENTAL SURGERY     DILATION AND EVACUATION N/A 07/29/2013   Procedure: Ultrasound guided Suction DILATATION AND EVACUATION (D&E) 2ND TRIMESTER with tissue sent for chromosome analysis;  Surgeon: Lovenia Kim, MD;  Location: [facility];  Service: Gynecology;  Laterality: N/A;   FOOT SURGERY Left 2018   calcaneus   VENTRAL HERNIA REPAIR N/A 12/08/2018   Procedure: LAPAROSCOPIC VENTRAL WALL HERNIA REPAIR WITH MESH;  Surgeon: Michael Boston, MD;  Location: [facility];  Service: General;  Laterality: N/A;  :   Current Facility-Administered Medications:    0.9 %  sodium chloride infusion, , Intravenous, Continuous, Charlynne Cousins, MD, Last Rate: 100 mL/hr at 12/04/21 0947, New Bag at 12/04/21 0947   acetaminophen (TYLENOL) tablet 650 mg, 650 mg, Oral, Q6H PRN **OR** acetaminophen (TYLENOL) suppository 650 mg, 650 mg, Rectal, Q6H PRN, Posey Pronto, Vishal R, MD   albuterol (PROVENTIL) (2.5 MG/3ML) 0.083% nebulizer solution 2.5 mg, 2.5 mg, Inhalation, Q4H PRN, Posey Pronto, Vishal R, MD   buPROPion (WELLBUTRIN XL) 24 hr tablet 300 mg, 300 mg, Oral, Daily, Zada Finders R, MD, 300 mg at 12/04/21 0948   citalopram (CELEXA) tablet  40 mg, 40 mg, Oral, Daily, Zada Finders R, MD, 40 mg at 12/04/21 0947   dextromethorphan-guaiFENesin (Emmons DM) 30-600 MG per 12 hr tablet 1 tablet, 1 tablet, Oral, BID, Charlynne Cousins, MD, 1 tablet at 12/04/21 1007   enoxaparin (LOVENOX) injection 40 mg, 40 mg, Subcutaneous, Q24H, Patel, Vishal R, MD   ipratropium-albuterol (DUONEB) 0.5-2.5 (3) MG/3ML nebulizer solution 3 mL, 3 mL, Nebulization, Q6H PRN, Lenore Cordia, MD,  3 mL at 12/04/21 0148   ketorolac (TORADOL) 30 MG/ML injection 30 mg, 30 mg, Intravenous, Q6H PRN, Lenore Cordia, MD, 30 mg at 12/04/21 1208   LORazepam (ATIVAN) injection 1 mg, 1 mg, Intravenous, Q6H PRN, Lenore Cordia, MD, 1 mg at 12/04/21 0948   melatonin tablet 3 mg, 3 mg, Oral, QHS, Charlynne Cousins, MD   ondansetron [facility]) tablet 4 mg, 4 mg, Oral, Q6H PRN **OR** ondansetron (ZOFRAN) injection 4 mg, 4 mg, Intravenous, Q6H PRN, Posey Pronto, Vishal R, MD   senna-docusate (Senokot-S) tablet 1 tablet, 1 tablet, Oral, QHS PRN, Posey Pronto, Vishal R, MD   sodium chloride flush (NS) 0.9 % injection 3 mL, 3 mL, Intravenous, Q12H, Zada Finders R, MD, 3 mL at 12/04/21 0919   traZODone (DESYREL) tablet 50 mg, 50 mg, Oral, QHS, Lenore Cordia, MD, 50 mg at 12/04/21 0143  Current Outpatient Medications:    acetaminophen (TYLENOL) 500 MG tablet, Take 1,000 mg by mouth every 6 (six) hours as needed for moderate pain or headache., Disp: , Rfl:    albuterol (VENTOLIN HFA) 108 (90 Base) MCG/ACT inhaler, Inhale 2 puffs into the lungs every 4 (four) hours as needed for wheezing or shortness of breath., Disp: , Rfl:    buPROPion (WELLBUTRIN XL) 300 MG 24 hr tablet, Take 300 mg by mouth daily., Disp: , Rfl:    citalopram (CELEXA) 40 MG tablet, Take 40 mg by mouth daily. , Disp: , Rfl:    ibuprofen (ADVIL) 200 MG tablet, Take 800 mg by mouth every 6 (six) hours as needed for headache, mild pain or fever., Disp: , Rfl:    Magnesium 300 MG CAPS, Take 300 mg by mouth every evening., Disp: , Rfl:    Multiple Vitamins-Minerals (MULTIVITAMIN WITH MINERALS) tablet, Take 1 tablet by mouth daily., Disp: , Rfl: :   Allergies  Allergen Reactions   Oxycodone     Had poor tolerance:unable to sleep &  mild hallucination  :   Family History  Problem Relation Age of Onset   Depression Mother    Depression Father    Depression Sister    Depression Brother    Depression Daughter    Depression Son   :   Social  History   Socioeconomic History   Marital status: Married    Spouse name: Not on file   Number of children: Not on file   Years of education: Not on file   Highest education level: Not on file  Occupational History   Not on file  Tobacco Use   Smoking status: Never   Smokeless tobacco: Never  Vaping Use   Vaping Use: Never used  Substance and Sexual Activity   Alcohol use: Yes    Alcohol/week: 1.0 standard drink of alcohol    Types: 1 Glasses of wine per week    Comment: weekly   Drug use: No   Sexual activity: Yes  Other Topics Concern   Not on file  Social History Narrative   Not on file   Social Determinants  of Health   Financial Resource Strain: Not on file  Food Insecurity: Not on file  Transportation Needs: Not on file  Physical Activity: Not on file  Stress: Not on file  Social Connections: Not on file  Intimate Partner Violence: Not on file  :  Pertinent items are noted in HPI.  Exam: Blood pressure (!) 139/92, pulse 100, temperature 98 F (36.7 C), temperature source Oral, resp. rate (!) 27, last menstrual period 11/22/2021, SpO2 93 %, unknown if currently breastfeeding. General appearance: alert and cooperative appeared without distress. Head: atraumatic without any abnormalities. Eyes: conjunctivae/corneas clear. PERRL.  Sclera anicteric. Throat: lips, mucosa, and tongue normal; without oral thrush or ulcers. Resp: Scattered rhonchi and wheezes bilaterally. Cardio: regular rate and rhythm, S1, S2 normal, no murmur, click, rub or gallop GI: soft, non-tender; bowel sounds normal; no masses,  no organomegaly Skin: Skin color, texture, turgor normal. No rashes or lesions Lymph nodes: Cervical, supraclavicular, and axillary nodes normal. Neurologic: Grossly normal without any motor, sensory or deep tendon reflexes. Musculoskeletal: No joint deformity or effusion.   Recent Labs    12/03/21 1525 12/04/21 0500  WBC 11.9* 14.3*  HGB 14.7 13.3  HCT 43.4  38.8  PLT 439* 365    Recent Labs    12/03/21 1525 12/04/21 0500  NA 134* 132*  K 4.3 4.9  CL 100 101  CO2 21* 20*  GLUCOSE 99 109*  BUN 11 12  CREATININE 0.70 0.74  CALCIUM 10.0 9.5      CT Angio Chest Pulmonary Embolism (PE) W or WO Contrast  Result Date: 12/04/2021 CLINICAL DATA:  Chest pain or SOB, pleurisy or effusion suspected EXAM: CT ANGIOGRAPHY CHEST WITH CONTRAST TECHNIQUE: Multidetector CT imaging of the chest was performed using the standard protocol during bolus administration of intravenous contrast. Multiplanar CT image reconstructions and MIPs were obtained to evaluate the vascular anatomy. RADIATION DOSE REDUCTION: This exam was performed according to the departmental dose-optimization program which includes automated exposure control, adjustment of the mA and/or kV according to patient size and/or use of iterative reconstruction technique. CONTRAST:  93m OMNIPAQUE IOHEXOL 350 MG/ML SOLN COMPARISON:  12/03/2021 FINDINGS: Cardiovascular: SVC is patent. Heart size normal. No pericardial effusion. Satisfactory opacification of pulmonary arteries noted, and there is no evidence of pulmonary emboli. There is encasement and narrowing of right upper lobe pulmonary artery branch by tumor. Adequate contrast opacification of the thoracic aorta with no evidence of dissection, aneurysm, or stenosis. There is classic 3-vessel brachiocephalic arch anatomy without proximal stenosis. Mediastinum/Nodes: Bulky mediastinal and bilateral hilar adenopathy as seen on previous day's exam Lungs/Pleura: Innumerable pulmonary nodules bilaterally as before. Progressive airspace consolidation at the right lung base base. Trace left pleural effusion as before. No pneumothorax. Upper Abdomen: Stable gastrohepatic lymphadenopathy. No acute findings. Musculoskeletal: No chest wall abnormality. No acute or significant osseous findings. Review of the MIP images confirms the above findings. IMPRESSION: 1.  Negative for acute PE or thoracic aortic dissection. 2. Encasement and narrowing of the right upper lobe pulmonary artery by tumor. 3. Stable bilateral pulmonary nodules, hilar and mediastinal adenopathy. 4. Progressive consolidation at the right lung base Electronically Signed   By: DLucrezia EuropeM.D.   On: 12/04/2021 10:42   CT ABDOMEN PELVIS W CONTRAST  Result Date: 12/04/2021 CLINICAL DATA:  Weight loss, unintended masses on lung CT, weight loss, lymph involvement. Pulmonary metastatic disease of unknown primary. EXAM: CT ABDOMEN AND PELVIS WITH CONTRAST TECHNIQUE: Multidetector CT imaging of the abdomen and pelvis was  performed using the standard protocol following bolus administration of intravenous contrast. RADIATION DOSE REDUCTION: This exam was performed according to the departmental dose-optimization program which includes automated exposure control, adjustment of the mA and/or kV according to patient size and/or use of iterative reconstruction technique. CONTRAST:  66m OMNIPAQUE IOHEXOL 300 MG/ML  SOLN COMPARISON:  None. Findings are correlated with CT examination of the chest of 12/03/2021. FINDINGS: Lower chest: Innumerable pulmonary masses are again identified within the visualized lung bases bilaterally, better evaluated on previously performed dedicated CT imaging of the chest. Bulky bilateral pleural metastatic disease noted. Small left pleural effusion again seen. Pericardial lymphadenopathy noted. Hepatobiliary: No enhancing intrahepatic mass. No intra or extrahepatic biliary ductal dilation. Cholelithiasis without pericholecystic inflammatory change noted. Pancreas: Unremarkable Spleen: Unremarkable Adrenals/Urinary Tract: Adrenal glands are unremarkable. Kidneys are normal, without renal calculi, focal lesion, or hydronephrosis. Bladder is unremarkable. Stomach/Bowel: Stomach is within normal limits. Appendix appears normal. No evidence of bowel wall thickening, distention, or inflammatory  changes. Vascular/Lymphatic: Multiple pathologically enlarged lymph nodes are seen within the gastrohepatic ligament measuring up to 18 mm in short axis diameter at axial image # 23/3. The abdominal vasculature is unremarkable. Reproductive: A mixed solid and cystic mass replaces the left ovary measuring roughly 6.8 x 11.0 x 9.2 cm in greatest dimension. This may represent a primary ovarian mass or an ovarian metastasis. Uterus and right ovary are unremarkable. Other: No abdominal wall hernia. Musculoskeletal: No acute bone abnormality. No lytic or blastic bone lesion. IMPRESSION: 1. A mixed solid and cystic mass replaces the left ovary measuring roughly 6.8 x 11.0 x 9.2 cm in greatest dimension. This may represent a primary ovarian mass or an ovarian metastasis. 2. Multiple pathologically enlarged lymph nodes within the gastrohepatic ligament, consistent with metastatic disease. 3. Innumerable pulmonary masses within the visualized lung bases bilaterally, better evaluated on previously performed dedicated CT imaging of the chest. 4. Small left pleural effusion. 5. Cholelithiasis without pericholecystic inflammatory change. Electronically Signed   By: AFidela SalisburyM.D.   On: 12/04/2021 01:49   CT Chest Wo Contrast  Result Date: 12/03/2021 CLINICAL DATA:  Pneumonia suspected; * Tracking Code: BO * EXAM: CT CHEST WITHOUT CONTRAST TECHNIQUE: Multidetector CT imaging of the chest was performed following the standard protocol without IV contrast. RADIATION DOSE REDUCTION: This exam was performed according to the departmental dose-optimization program which includes automated exposure control, adjustment of the mA and/or kV according to patient size and/or use of iterative reconstruction technique. COMPARISON:  Radiograph dated November 26, 2021 FINDINGS: Cardiovascular: Normal heart size. Trace pericardial fluid. No coronary artery calcifications. Normal caliber thoracic aorta with no atherosclerotic disease.  Mediastinum/Nodes: Esophagus and thyroid are unremarkable. Bulky enlarged mediastinal and bilateral hilar lymph nodes. Reference right upper paratracheal lymph node measuring 2.9 cm in short axis on series 3, image 19. Reference AP window lymph node measuring 2.7 cm in short axis on image 25. Reference right hilar lymph node measuring 2.5 cm in short axis on series 3, image 29. Reference left hilar lymph node measuring 2.4 cm in short axis on image 77. Lungs/Pleura: Central airways are patent. Innumerable bilateral solid pulmonary nodules which are most prominent in the lower lungs reference right middle lobe pulmonary nodule measuring 2.5 x 1.9 cm on series 4, image 92. Reference left lower lobe pulmonary nodule measuring 2.1 x 1.6 cm on image 113. Perifissural/subpleural nodularity and interlobular septal thickening. Small right pleural effusion. Upper Abdomen: Enlarged upper abdominal lymph nodes. Reference gastrohepatic ligament lymph node measuring 1.5  cm in short axis on series 2, image 60. Musculoskeletal: No chest wall mass or suspicious bone lesions identified. IMPRESSION: 1. Innumerable bilateral solid pulmonary nodules which are most prominent in the lower lungs, findings are concerning for metastatic disease. 2. 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 3. Bulky mediastinal bilateral hilar lymph nodes, likely due to nodal metastatic disease. 4. Enlarged lymph nodes of the upper abdomen, concerning for additional site of nodal disease. 5. Small left pleural effusion. Electronically Signed   By: Yetta Glassman M.D.   On: 12/03/2021 16:46    Assessment and Plan:   41 year old woman with:  1.  Advanced malignancy with involvement with bulky disease in the lung including lymphadenopathy, nodules and lymphangitic spread.  She has also an ovarian mass this could be suspicious for primary ovarian cancer versus metastatic lesion.   CA125 is currently pending and the biopsy is scheduled for her.  These findings were discussed today with the patient the differential diagnosis was reviewed.  This appears to be consistent with metastatic malignancy of unknown primary at this time.  Ovarian cancer remains the most likely etiology given the clinical presentation although other possibilities include melanoma, GI, GU or breast cancer could also be a consideration.  At this time, we will await the results of her tumor markers as well as her biopsy before proceeding with treatment plan.  Most likely she will require palliative chemotherapy in the immediate future given her respiratory failure.  The nature of this chemotherapy will be highly dependent on biopsy results.  2.  Respiratory failure: Related to her metastatic disease and possibly lymphangitic spread.  She is currently receiving oxygen supplementation.  Consideration for steroids as an adjunct of therapy would be added if felt appropriate by pulmonary medicine.  3.  Disposition and follow-up: We will follow her status and patient closely and await the biopsy results.  We will arrange for immediate treatment for her cancer inpatient or outpatient depending on her clinical status and the nature of her cancer.  If this is a primary ovarian cancer will ask my colleague Dr. Alvy Bimler to assist in her care in the near future.    80  minutes were dedicated to this visit.  50% of the time was face-to-face.  The time was spent on reviewing laboratory data, imaging studies, discussing treatment options, discussing differential diagnosis and answering questions regarding future plan.     A copy of this consult has been forwarded to the requesting physician.

## 2021-12-04 NOTE — Progress Notes (Signed)
IR asked to eval for biopsy. BP 110/73   Pulse (!) 109   Temp 98.1 F (36.7 C) (Oral)   Resp (!) 28   LMP 11/22/2021   SpO2 94%   CT ABDOMEN PELVIS W CONTRAST IMPRESSION: 1. A mixed solid and cystic mass replaces the left ovary measuring roughly 6.8 x 11.0 x 9.2 cm in greatest dimension. This may represent a primary ovarian mass or an ovarian metastasis. 2. Multiple pathologically enlarged lymph nodes within the gastrohepatic ligament, consistent with metastatic disease. 3. Innumerable pulmonary masses within the visualized lung bases bilaterally, better evaluated on previously performed dedicated CT imaging of the chest. 4. Small left pleural effusion. 5. Cholelithiasis without pericholecystic inflammatory change. Electronically Signed   By: Fidela Salisbury M.D.   On: 12/04/2021 01:49   CT Chest Wo Contrast IMPRESSION: 1. Innumerable bilateral solid pulmonary nodules which are most prominent in the lower lungs, findings are concerning for metastatic disease. 2. 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 3. Bulky mediastinal bilateral hilar lymph nodes, likely due to nodal metastatic disease. 4. Enlarged lymph nodes of the upper abdomen, concerning for additional site of nodal disease. 5. Small left pleural effusion. Electronically Signed   By: Yetta Glassman M.D.   On: 12/03/2021 16:46    Imaging reviewed with Dr. Kathlene Cote Unfortunately, there's not a good target to biopsy for IR. Would NOT go after the pelvic/ovarian mass for risk of rupture and spread. He recommends pulm consult as there are multiple large hilar/pulm nodules and lymph nodes that could be amenable to bronchoscopy/biopsy.  D/w primary team.  Ascencion Dike PA-C Interventional Radiology 12/04/2021 9:28 AM

## 2021-12-04 NOTE — ED Notes (Signed)
Lunch order placed

## 2021-12-04 NOTE — Progress Notes (Addendum)
[facility] PROGRESS NOTE    Progress Note  Val Farnam  YBW:[id number] DOB: Nov 10, 1980 DOA: 12/03/2021 PCP: Lennie Odor, PA     Brief Narrative:   Melanie Ramirez is an 41 y.o. 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  CT abdomen and pelvis showed a solid cystic mass which has replaced the left ovary measuring 7 x 11 x 9 and multiple enlarged lymph nodes within the gastropathic ligament consistent with metastatic disease.  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 Patient was very anxious on admission she was started on IV Ativan. CA125 is pending. Consult IR for biopsy, place the patient n.p.o. Oncology has been consulted. We will get a CT angio of the chest to rule out PE she is tachycardic and hypoxic requiring 35% on a Venturi mask. During our interview patient had a persistent cough.  Anxiety and depression Patient is very anxious this morning, use Ativan as needed, melatonin for sleep.  Leukocytosis: of unclear etiology due to metastatic disease. She has remained afebrile. Continue to monitor closely for fever curve.  DVT prophylaxis: lovenox Family Communication:husband Status is:  Inpatient Remains inpatient appropriate because: Acute respiratory failure with hypoxia probably due to metastatic ovarian cancer    Code Status:     Code Status Orders  (From admission, onward)           Start     Ordered   12/04/21 0051  Full code  Continuous        12/04/21 0052           Code Status History     Date Active Date Inactive Code Status Order ID Comments User Context   06/16/2014 0452 06/17/2014 1546 Full Code [id number]  Artelia Laroche, [place] Inpatient   06/15/2014 1223 06/16/2014 0452 Full Code [id number]  Artelia Laroche, CNM Inpatient         IV Access:   Peripheral IV   Procedures and diagnostic studies:   CT ABDOMEN PELVIS W CONTRAST  Result Date: 12/04/2021 CLINICAL DATA:  Weight loss, unintended masses on lung CT, weight loss, lymph involvement. Pulmonary metastatic disease of unknown primary. EXAM: CT ABDOMEN AND PELVIS WITH CONTRAST TECHNIQUE: Multidetector CT imaging of the abdomen and pelvis was performed using the standard protocol following bolus administration of intravenous contrast. RADIATION DOSE REDUCTION: This exam was performed according to the departmental dose-optimization program which includes automated exposure control, adjustment of the mA and/or kV according to patient size and/or use of iterative reconstruction technique. CONTRAST:  54m OMNIPAQUE IOHEXOL 300 MG/ML  SOLN COMPARISON:  None. Findings are correlated with CT examination of the chest of 12/03/2021. FINDINGS: Lower chest: Innumerable pulmonary masses are again identified within the visualized lung bases bilaterally, better evaluated on previously performed dedicated CT imaging of the chest. Bulky bilateral pleural metastatic disease noted. Small left pleural effusion again  seen. Pericardial lymphadenopathy noted. Hepatobiliary: No enhancing intrahepatic mass. No intra or extrahepatic biliary ductal dilation. Cholelithiasis without pericholecystic inflammatory change noted.  Pancreas: Unremarkable Spleen: Unremarkable Adrenals/Urinary Tract: Adrenal glands are unremarkable. Kidneys are normal, without renal calculi, focal lesion, or hydronephrosis. Bladder is unremarkable. Stomach/Bowel: Stomach is within normal limits. Appendix appears normal. No evidence of bowel wall thickening, distention, or inflammatory changes. Vascular/Lymphatic: Multiple pathologically enlarged lymph nodes are seen within the gastrohepatic ligament measuring up to 18 mm in short axis diameter at axial image # 23/3. The abdominal vasculature is unremarkable. Reproductive: A mixed solid and cystic mass replaces the left ovary measuring roughly 6.8 x 11.0 x 9.2 cm in greatest dimension. This may represent a primary ovarian mass or an ovarian metastasis. Uterus and right ovary are unremarkable. Other: No abdominal wall hernia. Musculoskeletal: No acute bone abnormality. No lytic or blastic bone lesion. IMPRESSION: 1. A mixed solid and cystic mass replaces the left ovary measuring roughly 6.8 x 11.0 x 9.2 cm in greatest dimension. This may represent a primary ovarian mass or an ovarian metastasis. 2. Multiple pathologically enlarged lymph nodes within the gastrohepatic ligament, consistent with metastatic disease. 3. Innumerable pulmonary masses within the visualized lung bases bilaterally, better evaluated on previously performed dedicated CT imaging of the chest. 4. Small left pleural effusion. 5. Cholelithiasis without pericholecystic inflammatory change. Electronically Signed   By: Fidela Salisbury M.D.   On: 12/04/2021 01:49   CT Chest Wo Contrast  Result Date: 12/03/2021 CLINICAL DATA:  Pneumonia suspected; * Tracking Code: BO * EXAM: CT CHEST WITHOUT CONTRAST TECHNIQUE: Multidetector CT imaging of the chest was performed following the standard protocol without IV contrast. RADIATION DOSE REDUCTION: This exam was performed according to the departmental dose-optimization program which includes automated  exposure control, adjustment of the mA and/or kV according to patient size and/or use of iterative reconstruction technique. COMPARISON:  Radiograph dated November 26, 2021 FINDINGS: Cardiovascular: Normal heart size. Trace pericardial fluid. No coronary artery calcifications. Normal caliber thoracic aorta with no atherosclerotic disease. Mediastinum/Nodes: Esophagus and thyroid are unremarkable. Bulky enlarged mediastinal and bilateral hilar lymph nodes. Reference right upper paratracheal lymph node measuring 2.9 cm in short axis on series 3, image 19. Reference AP window lymph node measuring 2.7 cm in short axis on image 25. Reference right hilar lymph node measuring 2.5 cm in short axis on series 3, image 29. Reference left hilar lymph node measuring 2.4 cm in short axis on image 77. Lungs/Pleura: Central airways are patent. Innumerable bilateral solid pulmonary nodules which are most prominent in the lower lungs reference right middle lobe pulmonary nodule measuring 2.5 x 1.9 cm on series 4, image 92. Reference left lower lobe pulmonary nodule measuring 2.1 x 1.6 cm on image 113. Perifissural/subpleural nodularity and interlobular septal thickening. Small right pleural effusion. Upper Abdomen: Enlarged upper abdominal lymph nodes. Reference gastrohepatic ligament lymph node measuring 1.5 cm in short axis on series 2, image 60. Musculoskeletal: No chest wall mass or suspicious bone lesions identified. IMPRESSION: 1. Innumerable bilateral solid pulmonary nodules which are most prominent in the lower lungs, findings are concerning for metastatic disease. 2. 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 3. Bulky mediastinal bilateral hilar lymph nodes, likely due to nodal metastatic disease. 4. Enlarged lymph nodes of the upper abdomen, concerning for additional site of nodal disease. 5. Small left pleural effusion. Electronically Signed   By: Yetta Glassman M.D.   On: 12/03/2021 16:46     Medical Consultants:  None.   Subjective:    Ivonne Andrew persistent cough very anxious this morning.  Objective:    Vitals:   12/04/21 0500 12/04/21 0615 12/04/21 0630 12/04/21 0700  BP: 109/67 127/75 116/74 114/73  Pulse: (!) 104 (!) 138 (!) 110 (!) 107  Resp: (!) 24 (!) 25 (!) 34 (!) 26  Temp:      TempSrc:      SpO2: 92% 97% 97% 93%   SpO2: 93 % O2 Flow Rate (L/min): 3 L/min  No intake or output data in the 24 hours ending 12/04/21 0859 There were no vitals filed for this visit.  Exam: General exam: In no acute distress. Respiratory system: Good air movement and clear to auscultation. Cardiovascular system: S1 & S2 heard, RRR. No JVD.  Gastrointestinal system: Abdomen is nondistended, soft and nontender.  Extremities: No pedal edema. Skin: No rashes, lesions or ulcers Psychiatry: Judgement and insight appear normal. Mood & affect appropriate.    Data Reviewed:    Labs: Basic Metabolic Panel: Recent Labs  Lab 12/03/21 1525 12/04/21 0500  NA 134* 132*  K 4.3 4.9  CL 100 101  CO2 21* 20*  GLUCOSE 99 109*  BUN 11 12  CREATININE 0.70 0.74  CALCIUM 10.0 9.5   GFR CrCl cannot be calculated (Unknown ideal weight.). Liver Function Tests: Recent Labs  Lab 12/03/21 2346 12/04/21 0500  AST 46* 45*  ALT 28 24  ALKPHOS 107 95  BILITOT 0.5 0.7  PROT 7.4 6.5  ALBUMIN 3.3* 2.9*   Recent Labs  Lab 12/03/21 2346  LIPASE 20   No results for input(s): "AMMONIA" in the last 168 hours. Coagulation profile No results for input(s): "INR", "PROTIME" in the last 168 hours. COVID-19 Labs  No results for input(s): "DDIMER", "FERRITIN", "LDH", "CRP" in the last 72 hours.  Lab Results  Component Value Date   [place] Not Detected 06/15/2019   [place] Not Detected 01/12/2019   SARSCOV2NAA NOT DETECTED 12/04/2018    CBC: Recent Labs  Lab 12/03/21 1525 12/04/21 0500  WBC 11.9* 14.3*  NEUTROABS   --  11.0*  HGB 14.7 13.3  HCT 43.4 38.8  MCV 90.8 90.0  PLT 439* 365   Cardiac Enzymes: No results for input(s): "CKTOTAL", "CKMB", "CKMBINDEX", "TROPONINI" in the last 168 hours. BNP (last 3 results) No results for input(s): "PROBNP" in the last 8760 hours. CBG: No results for input(s): "GLUCAP" in the last 168 hours. D-Dimer: No results for input(s): "DDIMER" in the last 72 hours. Hgb A1c: No results for input(s): "HGBA1C" in the last 72 hours. Lipid Profile: No results for input(s): "CHOL", "HDL", "LDLCALC", "TRIG", "CHOLHDL", "LDLDIRECT" in the last 72 hours. Thyroid function studies: No results for input(s): "TSH", "T4TOTAL", "T3FREE", "THYROIDAB" in the last 72 hours.  Invalid input(s): "FREET3" Anemia work up: No results for input(s): "VITAMINB12", "FOLATE", "FERRITIN", "TIBC", "IRON", "RETICCTPCT" in the last 72 hours. Sepsis Labs: Recent Labs  Lab 12/03/21 1513 12/03/21 1525 12/04/21 0500  WBC  --  11.9* 14.3*  LATICACIDVEN 1.7  --   --    Microbiology No results found for this or any previous visit (from the past 240 hour(s)).   Medications:    buPROPion  300 mg Oral Daily   citalopram  40 mg Oral Daily   enoxaparin (LOVENOX) injection  40 mg Subcutaneous Q24H   sodium chloride flush  3 mL Intravenous Q12H   traZODone  50 mg Oral QHS   Continuous Infusions:    LOS: 0 days  Charlynne Cousins  [facility]  12/04/2021, 8:59 AM

## 2021-12-04 NOTE — H&P (Signed)
History and Physical    Melanie Ramirez HEN:[id number] DOB: [birth year] DOA: 12/03/2021  PCP: Lennie Odor, PA  Patient coming from: Home  I have personally briefly reviewed patient's old medical records in [place]  Chief Complaint: Shortness of breath  HPI: Melanie Ramirez is a 41 y.o. female with medical history significant for depression/anxiety who presented to the ED for evaluation of shortness of breath.  Patient states that she has been feeling short of breath since late May 2023.  She has had associated nonproductive cough.  She was seen at the [facility] walk-in clinic on 6/4 at which time a chest x-ray was obtained and showed diffuse bilateral pulmonary infiltrates with sparing of the apices.  She was treated with antibiotics with doxycycline and Levaquin.  Symptoms persisted and repeat chest x-ray 6/6 again showed similar extensive patchy bilateral airspace opacities most pronounced in the perihilar and bibasilar regions.  During this time she has been having night sweats and approximately 8 pound weight loss.  She has had some diarrhea and low appetite which she attributed to the antibiotics and feels that both are slowly improving now.  She has not noticed any obvious lumps/bumps or swollen lymph nodes.  ED Course  Labs/Imaging on admission: I have personally reviewed following labs and imaging studies.  Initial vitals showed BP 151/89, pulse 91, RR 32, temp 98.1 F, SPO2 94% on room air.  Patient subsequently placed on 3 L O2 via [place] for tachypnea and SPO2 dropping to high 80s per EDP report.   Labs show WBC 11.9, hemoglobin 14.7, platelets 439,000, sodium 134, potassium 4.3, bicarb 21, BUN 11, creatinine 0.70, serum glucose 99, i-STAT beta-hCG <5.0.  Lactic acid 1.7.  CT chest without contrast shows innumerable bilateral solid pulmonary nodules most prominent in the lower lungs concerning for metastatic disease.  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  Bulky mediastinal bilateral hilar lymph nodes, enlarged lymph nodes in the upper abdomen, and small left pleural effusion also seen.  The hospitalist service was consulted to admit for further evaluation and management.   Review of Systems: All systems reviewed and are negative except as documented in history of present illness above.   Past Medical History:  Diagnosis Date   Active labor 03/21/2011   Anxiety    Depression    Headache(784.0)    occ. migraines with periods   Pneumonia    as teenager   Postpartum care following vaginal delivery (9/28) 03/21/2011    Past Surgical History:  Procedure Laterality Date   DENTAL SURGERY     DILATION AND EVACUATION N/A 07/29/2013   Procedure: Ultrasound guided Suction DILATATION AND EVACUATION (D&E) 2ND TRIMESTER with tissue sent for chromosome analysis;  Surgeon: Lovenia Kim, MD;  Location: [facility];  Service: Gynecology;  Laterality: N/A;   FOOT SURGERY Left 2018   calcaneus   VENTRAL HERNIA REPAIR N/A 12/08/2018   Procedure: LAPAROSCOPIC VENTRAL WALL HERNIA REPAIR WITH MESH;  Surgeon: Michael Boston, MD;  Location: [facility];  Service: General;  Laterality: N/A;    Social History:  reports that she has never smoked. She has never used smokeless tobacco. She reports current alcohol use of about 1.0 standard drink of alcohol per week. She reports that she does not use drugs.  Allergies  Allergen Reactions   Oxycodone     Had poor tolerance:unable to sleep &  mild hallucination    Family History  Problem Relation Age of Onset   Depression  Mother    Depression Father    Depression Sister    Depression Brother    Depression Daughter    Depression Son      Prior to Admission medications   Medication Sig Start Date End Date Taking? Authorizing Provider  buPROPion (WELLBUTRIN XL) 150 MG 24 hr tablet Take 150 mg by mouth daily.    [provider]  citalopram  (CELEXA) 40 MG tablet Take 40 mg by mouth daily.     [provider]  Magnesium 300 MG CAPS Take 300 mg by mouth every evening.    [provider]  Multiple Vitamins-Minerals (MULTIVITAMIN WITH MINERALS) tablet Take 1 tablet by mouth daily.    [provider]  traMADol (ULTRAM) 50 MG tablet Take 1-2 tablets (50-100 mg total) by mouth every 6 (six) hours as needed for moderate pain or severe pain. 12/08/18   Michael Boston, MD    Physical Exam: Vitals:   12/03/21 1843 12/03/21 1949 12/03/21 2150 12/04/21 0015  BP:  135/75 138/78 129/64  Pulse:  (!) 110 (!) 117 (!) 114  Resp:  20 16 (!) 34  Temp:      TempSrc:      SpO2: 95% 94% 93% 94%   Constitutional: Sitting up in bed, appears fatigued and anxious Eyes: EOMI, lids and conjunctivae normal ENMT: Mucous membranes are moist. Posterior pharynx clear of any exudate or lesions.Normal dentition.  Neck: normal, supple, no masses. Respiratory: clear to auscultation bilaterally while on NRB.  Increased respiratory effort. No accessory muscle use.  Cardiovascular: Tachycardic, no murmurs / rubs / gallops. No extremity edema. 2+ pedal pulses. Abdomen: no tenderness, no masses palpated. No hepatosplenomegaly.  Musculoskeletal: no clubbing / cyanosis. No joint deformity upper and lower extremities. Good ROM, no contractures. Normal muscle tone.  Skin: no rashes, lesions, ulcers. No induration Neurologic: Sensation intact. Strength 5/5 in all 4.  Psychiatric: Normal judgment and insight. Alert and oriented x 3.  Anxious mood.   EKG: Personally reviewed. Sinus tachycardia, rate 111, LAE.  No prior for comparison.  Assessment/Plan Principal Problem:   Acute respiratory failure with hypoxia (HCC) Active Problems:   Metastatic disease (HCC)   Anxiety and depression   Melanie Ramirez is a 41 y.o. female with medical history significant for depression/anxiety who is admitted with acute hypoxic respiratory failure found to  have extensive pulmonary nodules and lymphadenopathy on CT chest concerning for metastatic disease.  Assessment and Plan: * Acute respiratory failure with hypoxia (HCC) Metastatic disease Requiring 3 L O2 via [place] on arrival.  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  Findings highly concerning for metastatic disease of unknown primary. -Continue supplemental oxygen, titrate as needed -Very anxious, add IV Ativan as needed -Follow CT abdomen/pelvis -Oncology consult in a.m.  Anxiety and depression Continue home Celexa and Wellbutrin.  Add IV Ativan as needed for severe anxiety as above.  DVT prophylaxis: enoxaparin (LOVENOX) injection 40 mg Start: 12/04/21 1400 Code Status: Full code  Family Communication: Husband at bedside Disposition Plan: Pending clinical progress Consults called: Oncology consult in a.m. Severity of Illness: The appropriate patient status for this patient is INPATIENT. Inpatient status is judged to be reasonable and necessary in order to provide the required intensity of service to ensure the patient's safety. The patient's presenting symptoms, physical exam findings, and initial radiographic and laboratory data in the context of their chronic comorbidities is felt to place them at  high risk for further clinical deterioration. Furthermore, it is not anticipated that the patient will be medically stable for discharge from the hospital within 2 midnights of admission.   * I certify that at the point of admission it is my clinical judgment that the patient will require inpatient hospital care spanning beyond 2 midnights from the point of admission due to high intensity of service, high risk for further deterioration and high frequency of surveillance required.Zada Finders MD [facility]  If 7PM-7AM, please contact  night-coverage [email or web address]  12/04/2021, 1:12 AM

## 2021-12-04 NOTE — Hospital Course (Signed)
Melanie Ramirez is a 41 y.o. female with medical history significant for depression/anxiety who is admitted with acute hypoxic respiratory failure found to have extensive pulmonary nodules and lymphadenopathy on CT chest concerning for metastatic disease.

## 2021-12-04 NOTE — Assessment & Plan Note (Signed)
Continue home Celexa and Wellbutrin.  Add IV Ativan as needed for severe anxiety as above.

## 2021-12-04 NOTE — Progress Notes (Signed)
Chaplain was contacted by patient's church family to check on her.  Chaplain encountered patient and her mother Melanie Ramirez in the ED waiting area. Patient on oxygen and looking frail and tired.  Said she had had pneumonia for 10 days and was feeling SOB.  Patient has two children, and is active in [employer].  This chaplain is also member of the same church.  This is how the church contacted her to make this visit before patient was admitted.  Chaplain offered prayer and will refer this patient to the incoming chaplain as she sees from notes that further tests are coming. Rev. Tamsen Snider Pager [phone]

## 2021-12-04 NOTE — Progress Notes (Signed)
Chief Complaint: Patient was seen in consultation today for biopsy of pelvic mass or pulm nodule.  Referring Physician(s): Dr. Elsworth Soho Dr. Aileen Fass Dr. Alen Blew  Supervising Physician: Juliet Rude  Patient Status: [facility] - In-pt  History of Present Illness: Melanie Ramirez is a 41 y.o. female who was undergoing outpt workup for some SOB and weight loss. 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 Imaging initially reviewed by IR team and felt pulmonary eval for consideration of bronchoscopy/EBUS may be better indicated. However, pt has significant respiratory issues labored breathing requiring venti mask to maintains O2 sats above 93%. Bronch/EBUS would require anesthesia/deep sedation and intubation. Considering this, IR is asked to eval for percutaneous biopsy of either pulmonary nodule or pelvic mass, which would require less sedation.  PMHx, meds, labs, imaging, allergies reviewed. Pt very anxious and trying to process all this information and potential diagnosis. Mother at bedside.   Past Medical History:  Diagnosis Date   Active labor 03/21/2011   Anxiety    Depression    Headache(784.0)    occ. migraines with periods   Pneumonia    as teenager   Postpartum care following vaginal delivery (9/28) 03/21/2011    Past Surgical History:  Procedure Laterality Date   DENTAL SURGERY     DILATION AND EVACUATION N/A 07/29/2013   Procedure: Ultrasound guided Suction DILATATION AND EVACUATION (D&E) 2ND TRIMESTER with tissue sent for chromosome analysis;  Surgeon: Lovenia Kim, MD;  Location: [facility];  Service: Gynecology;  Laterality: N/A;   FOOT SURGERY Left 2018   calcaneus   VENTRAL HERNIA REPAIR N/A 12/08/2018   Procedure: LAPAROSCOPIC VENTRAL WALL HERNIA REPAIR WITH MESH;  Surgeon: Michael Boston, MD;  Location: [facility];  Service: General;   Laterality: N/A;    Allergies: Oxycodone  Medications:  Current Facility-Administered Medications:    0.9 %  sodium chloride infusion, , Intravenous, Continuous, Charlynne Cousins, MD, Last Rate: 100 mL/hr at 12/04/21 0947, New Bag at 12/04/21 0947   acetaminophen (TYLENOL) tablet 650 mg, 650 mg, Oral, Q6H PRN **OR** acetaminophen (TYLENOL) suppository 650 mg, 650 mg, Rectal, Q6H PRN, Posey Pronto, Vishal R, MD   albuterol (PROVENTIL) (2.5 MG/3ML) 0.083% nebulizer solution 2.5 mg, 2.5 mg, Inhalation, Q4H PRN, Posey Pronto, Vishal R, MD   azithromycin (ZITHROMAX) 500 mg in sodium chloride 0.9 % 250 mL IVPB, 500 mg, Intravenous, Q24H, Charlynne Cousins, MD, Last Rate: 250 mL/hr at 12/04/21 1457, 500 mg at 12/04/21 1457   buPROPion (WELLBUTRIN XL) 24 hr tablet 300 mg, 300 mg, Oral, Daily, Posey Pronto, Vishal R, MD, 300 mg at 12/04/21 0948   cefTRIAXone (ROCEPHIN) 2 g in sodium chloride 0.9 % 100 mL IVPB, 2 g, Intravenous, Q24H, Charlynne Cousins, MD, Last Rate: 200 mL/hr at 12/04/21 1454, 2 g at 12/04/21 1454   citalopram (CELEXA) tablet 40 mg, 40 mg, Oral, Daily, Zada Finders R, MD, 40 mg at 12/04/21 0947   dextromethorphan-guaiFENesin ([place] DM) 30-600 MG per 12 hr tablet 1 tablet, 1 tablet, Oral, BID, Charlynne Cousins, MD, 1 tablet at 12/04/21 1007   enoxaparin (LOVENOX) injection 40 mg, 40 mg, Subcutaneous, Q24H, Patel, Vishal R, MD, 40 mg at 12/04/21 1458   ipratropium-albuterol (DUONEB) 0.5-2.5 (3) MG/3ML nebulizer solution 3 mL, 3 mL, Nebulization, Q6H PRN, Zada Finders R, MD, 3 mL at 12/04/21 0148   ketorolac (TORADOL) 30 MG/ML injection 30 mg, 30 mg,  Intravenous, Q6H PRN, Zada Finders R, MD, 30 mg at 12/04/21 1208   LORazepam (ATIVAN) injection 1 mg, 1 mg, Intravenous, Q6H PRN, Zada Finders R, MD, 1 mg at 12/04/21 0948   melatonin tablet 3 mg, 3 mg, Oral, QHS, Charlynne Cousins, MD   ondansetron [facility]) tablet 4 mg, 4 mg, Oral, Q6H PRN **OR** ondansetron (ZOFRAN) injection 4 mg, 4 mg,  Intravenous, Q6H PRN, Posey Pronto, Vishal R, MD   senna-docusate (Senokot-S) tablet 1 tablet, 1 tablet, Oral, QHS PRN, Zada Finders R, MD   sodium chloride flush (NS) 0.9 % injection 3 mL, 3 mL, Intravenous, Q12H, Zada Finders R, MD, 3 mL at 12/04/21 0919   traZODone (DESYREL) tablet 50 mg, 50 mg, Oral, QHS, Zada Finders R, MD, 50 mg at 12/04/21 0143    Family History  Problem Relation Age of Onset   Depression Mother    Depression Father    Depression Sister    Depression Brother    Depression Daughter    Depression Son     Social History   Socioeconomic History   Marital status: Married    Spouse name: Not on file   Number of children: Not on file   Years of education: Not on file   Highest education level: Not on file  Occupational History   Not on file  Tobacco Use   Smoking status: Never   Smokeless tobacco: Never  Vaping Use   Vaping Use: Never used  Substance and Sexual Activity   Alcohol use: Yes    Alcohol/week: 1.0 standard drink of alcohol    Types: 1 Glasses of wine per week    Comment: weekly   Drug use: No   Sexual activity: Yes  Other Topics Concern   Not on file  Social History Narrative   Not on file   Social Determinants of Health   Financial Resource Strain: Not on file  Food Insecurity: Not on file  Transportation Needs: Not on file  Physical Activity: Not on file  Stress: Not on file  Social Connections: Not on file    Review of Systems: A 12 point ROS discussed and pertinent positives are indicated in the HPI above.  All other systems are negative.  Review of Systems  Vital Signs: BP 127/74 (BP Location: Right Arm)   Pulse 100   Temp 97.9 F (36.6 C) (Oral)   Resp (!) 27   LMP 11/22/2021   SpO2 93%   Physical Exam Constitutional:      Appearance: She is ill-appearing. She is not toxic-appearing.  HENT:     Mouth/Throat:     Mouth: Mucous membranes are moist.     Pharynx: Oropharynx is clear.  Cardiovascular:     Rate and  Rhythm: Regular rhythm. Tachycardia present.     Heart sounds: Normal heart sounds.  Pulmonary:     Breath sounds: Normal breath sounds. No rhonchi or rales.     Comments: Increased work of breathing Abdominal:     General: There is no distension.     Palpations: Abdomen is soft.     Tenderness: There is no abdominal tenderness.  Skin:    General: Skin is warm and dry.  Neurological:     General: No focal deficit present.     Mental Status: She is alert and oriented to person, place, and time.  Psychiatric:        Mood and Affect: Mood normal.        Thought Content: Thought  content normal.       Imaging: [place] EKG SITE RITE  Result Date: 12/04/2021 If Site Rite image not attached, placement could not be confirmed due to current cardiac rhythm.  CT Angio Chest Pulmonary Embolism (PE) W or WO Contrast  Result Date: 12/04/2021 CLINICAL DATA:  Chest pain or SOB, pleurisy or effusion suspected EXAM: CT ANGIOGRAPHY CHEST WITH CONTRAST TECHNIQUE: Multidetector CT imaging of the chest was performed using the standard protocol during bolus administration of intravenous contrast. Multiplanar CT image reconstructions and MIPs were obtained to evaluate the vascular anatomy. RADIATION DOSE REDUCTION: This exam was performed according to the departmental dose-optimization program which includes automated exposure control, adjustment of the mA and/or kV according to patient size and/or use of iterative reconstruction technique. CONTRAST:  37m OMNIPAQUE IOHEXOL 350 MG/ML SOLN COMPARISON:  12/03/2021 FINDINGS: Cardiovascular: SVC is patent. Heart size normal. No pericardial effusion. Satisfactory opacification of pulmonary arteries noted, and there is no evidence of pulmonary emboli. There is encasement and narrowing of right upper lobe pulmonary artery branch by tumor. Adequate contrast opacification of the thoracic aorta with no evidence of dissection, aneurysm, or stenosis. There is classic 3-vessel  brachiocephalic arch anatomy without proximal stenosis. Mediastinum/Nodes: Bulky mediastinal and bilateral hilar adenopathy as seen on previous day's exam Lungs/Pleura: Innumerable pulmonary nodules bilaterally as before. Progressive airspace consolidation at the right lung base base. Trace left pleural effusion as before. No pneumothorax. Upper Abdomen: Stable gastrohepatic lymphadenopathy. No acute findings. Musculoskeletal: No chest wall abnormality. No acute or significant osseous findings. Review of the MIP images confirms the above findings. IMPRESSION: 1. Negative for acute PE or thoracic aortic dissection. 2. Encasement and narrowing of the right upper lobe pulmonary artery by tumor. 3. Stable bilateral pulmonary nodules, hilar and mediastinal adenopathy. 4. Progressive consolidation at the right lung base Electronically Signed   By: DLucrezia EuropeM.D.   On: 12/04/2021 10:42   CT ABDOMEN PELVIS W CONTRAST  Result Date: 12/04/2021 CLINICAL DATA:  Weight loss, unintended masses on lung CT, weight loss, lymph involvement. Pulmonary metastatic disease of unknown primary. EXAM: CT ABDOMEN AND PELVIS WITH CONTRAST TECHNIQUE: Multidetector CT imaging of the abdomen and pelvis was performed using the standard protocol following bolus administration of intravenous contrast. RADIATION DOSE REDUCTION: This exam was performed according to the departmental dose-optimization program which includes automated exposure control, adjustment of the mA and/or kV according to patient size and/or use of iterative reconstruction technique. CONTRAST:  861mOMNIPAQUE IOHEXOL 300 MG/ML  SOLN COMPARISON:  None. Findings are correlated with CT examination of the chest of 12/03/2021. FINDINGS: Lower chest: Innumerable pulmonary masses are again identified within the visualized lung bases bilaterally, better evaluated on previously performed dedicated CT imaging of the chest. Bulky bilateral pleural metastatic disease noted. Small left  pleural effusion again seen. Pericardial lymphadenopathy noted. Hepatobiliary: No enhancing intrahepatic mass. No intra or extrahepatic biliary ductal dilation. Cholelithiasis without pericholecystic inflammatory change noted. Pancreas: Unremarkable Spleen: Unremarkable Adrenals/Urinary Tract: Adrenal glands are unremarkable. Kidneys are normal, without renal calculi, focal lesion, or hydronephrosis. Bladder is unremarkable. Stomach/Bowel: Stomach is within normal limits. Appendix appears normal. No evidence of bowel wall thickening, distention, or inflammatory changes. Vascular/Lymphatic: Multiple pathologically enlarged lymph nodes are seen within the gastrohepatic ligament measuring up to 18 mm in short axis diameter at axial image # 23/3. The abdominal vasculature is unremarkable. Reproductive: A mixed solid and cystic mass replaces the left ovary measuring roughly 6.8 x 11.0 x 9.2 cm in greatest dimension. This may represent  a primary ovarian mass or an ovarian metastasis. Uterus and right ovary are unremarkable. Other: No abdominal wall hernia. Musculoskeletal: No acute bone abnormality. No lytic or blastic bone lesion. IMPRESSION: 1. A mixed solid and cystic mass replaces the left ovary measuring roughly 6.8 x 11.0 x 9.2 cm in greatest dimension. This may represent a primary ovarian mass or an ovarian metastasis. 2. Multiple pathologically enlarged lymph nodes within the gastrohepatic ligament, consistent with metastatic disease. 3. Innumerable pulmonary masses within the visualized lung bases bilaterally, better evaluated on previously performed dedicated CT imaging of the chest. 4. Small left pleural effusion. 5. Cholelithiasis without pericholecystic inflammatory change. Electronically Signed   By: Fidela Salisbury M.D.   On: 12/04/2021 01:49   CT Chest Wo Contrast  Result Date: 12/03/2021 CLINICAL DATA:  Pneumonia suspected; * Tracking Code: BO * EXAM: CT CHEST WITHOUT CONTRAST TECHNIQUE: Multidetector  CT imaging of the chest was performed following the standard protocol without IV contrast. RADIATION DOSE REDUCTION: This exam was performed according to the departmental dose-optimization program which includes automated exposure control, adjustment of the mA and/or kV according to patient size and/or use of iterative reconstruction technique. COMPARISON:  Radiograph dated November 26, 2021 FINDINGS: Cardiovascular: Normal heart size. Trace pericardial fluid. No coronary artery calcifications. Normal caliber thoracic aorta with no atherosclerotic disease. Mediastinum/Nodes: Esophagus and thyroid are unremarkable. Bulky enlarged mediastinal and bilateral hilar lymph nodes. Reference right upper paratracheal lymph node measuring 2.9 cm in short axis on series 3, image 19. Reference AP window lymph node measuring 2.7 cm in short axis on image 25. Reference right hilar lymph node measuring 2.5 cm in short axis on series 3, image 29. Reference left hilar lymph node measuring 2.4 cm in short axis on image 77. Lungs/Pleura: Central airways are patent. Innumerable bilateral solid pulmonary nodules which are most prominent in the lower lungs reference right middle lobe pulmonary nodule measuring 2.5 x 1.9 cm on series 4, image 92. Reference left lower lobe pulmonary nodule measuring 2.1 x 1.6 cm on image 113. Perifissural/subpleural nodularity and interlobular septal thickening. Small right pleural effusion. Upper Abdomen: Enlarged upper abdominal lymph nodes. Reference gastrohepatic ligament lymph node measuring 1.5 cm in short axis on series 2, image 60. Musculoskeletal: No chest wall mass or suspicious bone lesions identified. IMPRESSION: 1. Innumerable bilateral solid pulmonary nodules which are most prominent in the lower lungs, findings are concerning for metastatic disease. 2. 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 3. Bulky mediastinal bilateral  hilar lymph nodes, likely due to nodal metastatic disease. 4. Enlarged lymph nodes of the upper abdomen, concerning for additional site of nodal disease. 5. Small left pleural effusion. Electronically Signed   By: Yetta Glassman M.D.   On: 12/03/2021 16:46    Labs:  CBC: Recent Labs    12/03/21 1525 12/04/21 0500  WBC 11.9* 14.3*  HGB 14.7 13.3  HCT 43.4 38.8  PLT 439* 365    COAGS: Recent Labs    12/04/21 1603  INR 1.1    BMP: Recent Labs    12/03/21 1525 12/04/21 0500  NA 134* 132*  K 4.3 4.9  CL 100 101  CO2 21* 20*  GLUCOSE 99 109*  BUN 11 12  CALCIUM 10.0 9.5  CREATININE 0.70 0.74  GFRNONAA >60 >60    LIVER FUNCTION TESTS: Recent Labs    12/03/21 2346 12/04/21 0500  BILITOT 0.5 0.7  AST 46* 45*  ALT 28 24  ALKPHOS 107 95  PROT  7.4 6.5  ALBUMIN 3.3* 2.9*    TUMOR MARKERS: No results for input(s): "AFPTM", "CEA", "CA199", "CHROMGRNA" in the last 8760 hours.  Assessment and Plan: Pelvic mass, suspect ovarian Pulmonary nodules and mediastinal/hilar adenopathy. Imaging reviewed and multidisciplinary conversation among care teams regarding best approach for tissue sampling. Tenuous respiratory status. Concern for rupture/spread with bx of pelvic/ovarian mass. Preference is for perc biopsy of pulm nodule if feasible and safe. Discussed risks of pulm nodule biopsy including potentially life threatening complications of hemorrhage and/or PTX, possibly requiring chest tube placement or intubation. If felt unsafe to proceed with pulm nodule biopsy, will defer to biopsy of pelvic mass, knowing risks of potential disease spread. Explained that sedation can minimized as best as possible for either procedure. Will assess resp status early tomorrow am before proceeding with biopsy. In anticipation of imminent chemotherapy, suggested that PICC be placed to start, as port placement also requires sedation.  Pt and mother understand plan moving  forward.     Thank you for this interesting consult.  I greatly enjoyed meeting Melanie Ramirez and look forward to participating in their care.  A copy of this report was sent to the requesting provider on this date.  Electronically Signed: Ascencion Dike, PA-C 12/04/2021, 4:47 PM   I spent a total of 40 minutes in face to face in clinical consultation, greater than 50% of which was counseling/coordinating care for pelvic mass, pulmonary nodules, need for tissue diagnosis.

## 2021-12-04 NOTE — Assessment & Plan Note (Addendum)
Metastatic disease Requiring 3 L O2 via [place] on arrival.  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  Findings highly concerning for metastatic disease of unknown primary. -Continue supplemental oxygen, titrate as needed -Very anxious, add IV Ativan as needed -Follow CT abdomen/pelvis -Oncology consult in a.m.

## 2021-12-05 ENCOUNTER — Encounter: Payer: Self-pay | Admitting: Hematology and Oncology

## 2021-12-05 ENCOUNTER — Inpatient Hospital Stay (HOSPITAL_COMMUNITY): Payer: BC Managed Care – PPO

## 2021-12-05 ENCOUNTER — Encounter (HOSPITAL_COMMUNITY)
Admission: EM | Disposition: A | Discharge status: Home / Self Care | Payer: Self-pay | Source: Ambulatory Visit | Attending: Internal Medicine

## 2021-12-05 DIAGNOSIS — C7802 Secondary malignant neoplasm of left lung: Secondary | ICD-10-CM

## 2021-12-05 DIAGNOSIS — N838 Other noninflammatory disorders of ovary, fallopian tube and broad ligament: Secondary | ICD-10-CM

## 2021-12-05 DIAGNOSIS — C801 Malignant (primary) neoplasm, unspecified: Secondary | ICD-10-CM | POA: Diagnosis not present

## 2021-12-05 DIAGNOSIS — J189 Pneumonia, unspecified organism: Secondary | ICD-10-CM

## 2021-12-05 DIAGNOSIS — J9601 Acute respiratory failure with hypoxia: Secondary | ICD-10-CM | POA: Diagnosis not present

## 2021-12-05 DIAGNOSIS — C7800 Secondary malignant neoplasm of unspecified lung: Secondary | ICD-10-CM | POA: Diagnosis not present

## 2021-12-05 DIAGNOSIS — R918 Other nonspecific abnormal finding of lung field: Secondary | ICD-10-CM | POA: Diagnosis not present

## 2021-12-05 DIAGNOSIS — F419 Anxiety disorder, unspecified: Secondary | ICD-10-CM | POA: Diagnosis not present

## 2021-12-05 DIAGNOSIS — C7801 Secondary malignant neoplasm of right lung: Secondary | ICD-10-CM

## 2021-12-05 LAB — CBC
HCT: 34.4 % — ABNORMAL LOW (ref 36.0–46.0)
Hemoglobin: 11.9 g/dL — ABNORMAL LOW (ref 12.0–15.0)
MCH: 31.5 pg (ref 26.0–34.0)
MCHC: 34.6 g/dL (ref 30.0–36.0)
MCV: 91 fL (ref 80.0–100.0)
Platelets: 335 10*3/uL (ref 150–400)
RBC: 3.78 MIL/uL — ABNORMAL LOW (ref 3.87–5.11)
RDW: 12.1 % (ref 11.5–15.5)
WBC: 10.7 10*3/uL — ABNORMAL HIGH (ref 4.0–10.5)
nRBC: 0 % (ref 0.0–0.2)

## 2021-12-05 LAB — BLOOD GAS, VENOUS
Acid-base deficit: 0.6 mmol/L (ref 0.0–2.0)
Bicarbonate: 24.9 mmol/L (ref 20.0–28.0)
O2 Saturation: 73.2 %
Patient temperature: 37
pCO2, Ven: 43 mmHg — ABNORMAL LOW (ref 44–60)
pH, Ven: 7.37 (ref 7.25–7.43)
pO2, Ven: 40 mmHg (ref 32–45)

## 2021-12-05 LAB — CA 125: Cancer Antigen (CA) 125: 296 U/mL — ABNORMAL HIGH (ref 0.0–38.1)

## 2021-12-05 LAB — SARS CORONAVIRUS 2 BY RT PCR: SARS Coronavirus 2 by RT PCR: NEGATIVE

## 2021-12-05 SURGERY — BRONCHOSCOPY, WITH EBUS
Anesthesia: General

## 2021-12-05 MED ORDER — MORPHINE SULFATE (PF) 2 MG/ML IV SOLN
2.0000 mg | Freq: Once | INTRAVENOUS | Status: DC
  Filled 2021-12-05: qty 1

## 2021-12-05 MED ORDER — MORPHINE SULFATE (PF) 2 MG/ML IV SOLN
2.0000 mg | Freq: Once | INTRAVENOUS | Status: AC
  Administered 2021-12-05: 2 mg via INTRAVENOUS

## 2021-12-05 MED ORDER — FENTANYL CITRATE (PF) 100 MCG/2ML IJ SOLN
INTRAMUSCULAR | Status: AC
  Filled 2021-12-05: qty 2

## 2021-12-05 MED ORDER — LIDOCAINE HCL 1 % IJ SOLN
INTRAMUSCULAR | Status: AC
  Filled 2021-12-05: qty 10

## 2021-12-05 MED ORDER — IPRATROPIUM-ALBUTEROL 0.5-2.5 (3) MG/3ML IN SOLN
3.0000 mL | Freq: Three times a day (TID) | RESPIRATORY_TRACT | Status: DC
  Administered 2021-12-06 – 2021-12-09 (×11): 3 mL via RESPIRATORY_TRACT
  Filled 2021-12-05 (×13): qty 3

## 2021-12-05 MED ORDER — MIDAZOLAM HCL 2 MG/2ML IJ SOLN
INTRAMUSCULAR | Status: AC
  Filled 2021-12-05: qty 2

## 2021-12-05 MED ORDER — MIDAZOLAM HCL 2 MG/2ML IJ SOLN
INTRAMUSCULAR | Status: AC | PRN
  Administered 2021-12-05: .5 mg via INTRAVENOUS

## 2021-12-05 NOTE — Progress Notes (Addendum)
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 CA125 is elevated at 296 Underwent CT-guided biopsy earlier today and will follow-up on results We had some initial discussion today regarding treatment options including chemotherapy If confirmed ovarian cancer, plan would be chemotherapy with carboplatin and taxol We briefly discussed expected side-effects associated with this regimen I explained to her why surgery and radiation therapy is not indicated She has a PICC line in place in anticipation of beginning chemotherapy in the near future I recommend transfer to [facility] when feasible, preferably to [address] [place] She will need genetic counseling referral in the outpatient setting  Acute hypoxic respiratory failure Due to bulky disease in the lung and possible lymphangitic spread Currently requiring a nonrebreather Pulmonology following Will plan to administer chemotherapy as an inpatient  Mild normocytic anemia Noted to have mild normocytic anemia Monitor  Anxiety/depression Has as needed Ativan available to her  Goals of care Plan for chemotherapy treatment ASAP  Discharge planning Not safe for discharge until she can be discharged on 2 Liters of oxygen She needs inpatient chemotherapy due to respiratory failure  Melanie Ramirez  SUBJECTIVE: Melanie Ramirez underwent a CT-guided biopsy of her right lower lobe pleural nodule earlier today Discussed with nursing and when she initially return, she was placed on O2 at 6 L/min  However, her respiratory status has worsened and she is now on a 100% nonrebreather I met with the patient and her mother  in her hospital room She reports ongoing shortness of breath and has tachypnea She is not having any abdominal pain, weight loss, appetite change although she has lost weight this week Her symptoms of dyspnea only started approximately 3-4 weeks ago Has remote history of abnormal PAP smear Positive family history in second degree relatives  REVIEW OF SYSTEMS:   Review of Systems  Constitutional:  Negative for chills and fever.  HENT: Negative.    Respiratory:  Positive for shortness of breath.   Cardiovascular: Negative.   Gastrointestinal: Negative.   Skin: Negative.   Neurological: Negative.    I have reviewed the past medical history, past surgical history, social history and family history with the patient and they are unchanged from previous note.   PHYSICAL EXAMINATION: ECOG PERFORMANCE STATUS: 1 - Symptomatic but completely ambulatory  Vitals:   12/05/21 0452 12/05/21 0742  BP: 130/74 (!) 152/72  Pulse:  (!) 124  Resp: (!) 24 (!) 22  Temp: 97.6 F (36.4 C)   SpO2: 95% 95%   There were no vitals filed for this visit.  Intake/Output from previous day: 06/14 0701 - 06/15 0700 In: 1821.2 [P.O.:240; I.V.:1231.2; IV Piggyback:350.1] Out: 0   Physical Exam Vitals reviewed.  Constitutional:      Comments: Appears tachypneic  HENT:     Head: Normocephalic.  Eyes:     General: No scleral icterus. Cardiovascular:     Rate and Rhythm: Tachycardia present.  Pulmonary:     Breath sounds: Normal breath sounds.  Abdominal:     General: There is no distension.     Palpations: Abdomen is soft.  Skin:    General: Skin is warm and dry.  Neurological:     Mental Status: She is alert  and oriented to person, place, and time.    LABORATORY DATA:  I have reviewed the data as listed    Latest Ref Rng & Units 12/04/2021    5:00 AM 12/03/2021   11:46 PM 12/03/2021    3:25 PM  CMP  Glucose 70 - 99 mg/dL 109   99   BUN 6 - 20 mg/dL 12   11   Creatinine 0.44 - 1.00 mg/dL  0.74   0.70   Sodium 135 - 145 mmol/L 132   134   Potassium 3.5 - 5.1 mmol/L 4.9   4.3   Chloride 98 - 111 mmol/L 101   100   CO2 22 - 32 mmol/L 20   21   Calcium 8.9 - 10.3 mg/dL 9.5   10.0   Total Protein 6.5 - 8.1 g/dL 6.5  7.4    Total Bilirubin 0.3 - 1.2 mg/dL 0.7  0.5    Alkaline Phos 38 - 126 U/L 95  107    AST 15 - 41 U/L 45  46    ALT 0 - 44 U/L 24  28      Lab Results  Component Value Date   WBC 10.7 (H) 12/05/2021   HGB 11.9 (L) 12/05/2021   HCT 34.4 (L) 12/05/2021   MCV 91.0 12/05/2021   PLT 335 12/05/2021   NEUTROABS 11.0 (H) 12/04/2021   I have reviewed her CT imaging No results found for: "CEA1", "CEA", "QJF354", "CA125", "PSA1"  [place] EKG SITE RITE  Result Date: 12/04/2021 If Site Rite image not attached, placement could not be confirmed due to current cardiac rhythm.  CT Angio Chest Pulmonary Embolism (PE) W or WO Contrast  Result Date: 12/04/2021 CLINICAL DATA:  Chest pain or SOB, pleurisy or effusion suspected EXAM: CT ANGIOGRAPHY CHEST WITH CONTRAST TECHNIQUE: Multidetector CT imaging of the chest was performed using the standard protocol during bolus administration of intravenous contrast. Multiplanar CT image reconstructions and MIPs were obtained to evaluate the vascular anatomy. RADIATION DOSE REDUCTION: This exam was performed according to the departmental dose-optimization program which includes automated exposure control, adjustment of the mA and/or kV according to patient size and/or use of iterative reconstruction technique. CONTRAST:  55m OMNIPAQUE IOHEXOL 350 MG/ML SOLN COMPARISON:  12/03/2021 FINDINGS: Cardiovascular: SVC is patent. Heart size normal. No pericardial effusion. Satisfactory opacification of pulmonary arteries noted, and there is no evidence of pulmonary emboli. There is encasement and narrowing of right upper lobe pulmonary artery branch by tumor. Adequate contrast opacification of the thoracic aorta with no evidence of dissection, aneurysm,  or stenosis. There is classic 3-vessel brachiocephalic arch anatomy without proximal stenosis. Mediastinum/Nodes: Bulky mediastinal and bilateral hilar adenopathy as seen on previous day's exam Lungs/Pleura: Innumerable pulmonary nodules bilaterally as before. Progressive airspace consolidation at the right lung base base. Trace left pleural effusion as before. No pneumothorax. Upper Abdomen: Stable gastrohepatic lymphadenopathy. No acute findings. Musculoskeletal: No chest wall abnormality. No acute or significant osseous findings. Review of the MIP images confirms the above findings. IMPRESSION: 1. Negative for acute PE or thoracic aortic dissection. 2. Encasement and narrowing of the right upper lobe pulmonary artery by tumor. 3. Stable bilateral pulmonary nodules, hilar and mediastinal adenopathy. 4. Progressive consolidation at the right lung base Electronically Signed   By: DLucrezia EuropeM.D.   On: 12/04/2021 10:42   CT ABDOMEN PELVIS W CONTRAST  Result Date: 12/04/2021 CLINICAL DATA:  Weight loss, unintended masses on lung CT, weight loss, lymph involvement. Pulmonary metastatic disease  of unknown primary. EXAM: CT ABDOMEN AND PELVIS WITH CONTRAST TECHNIQUE: Multidetector CT imaging of the abdomen and pelvis was performed using the standard protocol following bolus administration of intravenous contrast. RADIATION DOSE REDUCTION: This exam was performed according to the departmental dose-optimization program which includes automated exposure control, adjustment of the mA and/or kV according to patient size and/or use of iterative reconstruction technique. CONTRAST:  24m OMNIPAQUE IOHEXOL 300 MG/ML  SOLN COMPARISON:  None. Findings are correlated with CT examination of the chest of 12/03/2021. FINDINGS: Lower chest: Innumerable pulmonary masses are again identified within the visualized lung bases bilaterally, better evaluated on previously performed dedicated CT imaging of the chest. Bulky bilateral pleural  metastatic disease noted. Small left pleural effusion again seen. Pericardial lymphadenopathy noted. Hepatobiliary: No enhancing intrahepatic mass. No intra or extrahepatic biliary ductal dilation. Cholelithiasis without pericholecystic inflammatory change noted. Pancreas: Unremarkable Spleen: Unremarkable Adrenals/Urinary Tract: Adrenal glands are unremarkable. Kidneys are normal, without renal calculi, focal lesion, or hydronephrosis. Bladder is unremarkable. Stomach/Bowel: Stomach is within normal limits. Appendix appears normal. No evidence of bowel wall thickening, distention, or inflammatory changes. Vascular/Lymphatic: Multiple pathologically enlarged lymph nodes are seen within the gastrohepatic ligament measuring up to 18 mm in short axis diameter at axial image # 23/3. The abdominal vasculature is unremarkable. Reproductive: A mixed solid and cystic mass replaces the left ovary measuring roughly 6.8 x 11.0 x 9.2 cm in greatest dimension. This may represent a primary ovarian mass or an ovarian metastasis. Uterus and right ovary are unremarkable. Other: No abdominal wall hernia. Musculoskeletal: No acute bone abnormality. No lytic or blastic bone lesion. IMPRESSION: 1. A mixed solid and cystic mass replaces the left ovary measuring roughly 6.8 x 11.0 x 9.2 cm in greatest dimension. This may represent a primary ovarian mass or an ovarian metastasis. 2. Multiple pathologically enlarged lymph nodes within the gastrohepatic ligament, consistent with metastatic disease. 3. Innumerable pulmonary masses within the visualized lung bases bilaterally, better evaluated on previously performed dedicated CT imaging of the chest. 4. Small left pleural effusion. 5. Cholelithiasis without pericholecystic inflammatory change. Electronically Signed   By: AFidela SalisburyM.D.   On: 12/04/2021 01:49   CT Chest Wo Contrast  Result Date: 12/03/2021 CLINICAL DATA:  Pneumonia suspected; * Tracking Code: BO * EXAM: CT CHEST  WITHOUT CONTRAST TECHNIQUE: Multidetector CT imaging of the chest was performed following the standard protocol without IV contrast. RADIATION DOSE REDUCTION: This exam was performed according to the departmental dose-optimization program which includes automated exposure control, adjustment of the mA and/or kV according to patient size and/or use of iterative reconstruction technique. COMPARISON:  Radiograph dated November 26, 2021 FINDINGS: Cardiovascular: Normal heart size. Trace pericardial fluid. No coronary artery calcifications. Normal caliber thoracic aorta with no atherosclerotic disease. Mediastinum/Nodes: Esophagus and thyroid are unremarkable. Bulky enlarged mediastinal and bilateral hilar lymph nodes. Reference right upper paratracheal lymph node measuring 2.9 cm in short axis on series 3, image 19. Reference AP window lymph node measuring 2.7 cm in short axis on image 25. Reference right hilar lymph node measuring 2.5 cm in short axis on series 3, image 29. Reference left hilar lymph node measuring 2.4 cm in short axis on image 77. Lungs/Pleura: Central airways are patent. Innumerable bilateral solid pulmonary nodules which are most prominent in the lower lungs reference right middle lobe pulmonary nodule measuring 2.5 x 1.9 cm on series 4, image 92. Reference left lower lobe pulmonary nodule measuring 2.1 x 1.6 cm on image 113. Perifissural/subpleural nodularity and interlobular  septal thickening. Small right pleural effusion. Upper Abdomen: Enlarged upper abdominal lymph nodes. Reference gastrohepatic ligament lymph node measuring 1.5 cm in short axis on series 2, image 60. Musculoskeletal: No chest wall mass or suspicious bone lesions identified. IMPRESSION: 1. Innumerable bilateral solid pulmonary nodules which are most prominent in the lower lungs, findings are concerning for metastatic disease. 2. 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 3. Bulky mediastinal bilateral hilar lymph nodes, likely due to nodal metastatic disease. 4. Enlarged lymph nodes of the upper abdomen, concerning for additional site of nodal disease. 5. Small left pleural effusion. Electronically Signed   By: Yetta Glassman M.D.   On: 12/03/2021 16:46     No future appointments.    LOS: 1 day

## 2021-12-05 NOTE — Plan of Care (Signed)
  Problem: Education: Goal: Knowledge of General Education information will improve Description: Including pain rating scale, medication(s)/side effects and non-pharmacologic comfort measures Outcome: Progressing   Problem: Health Behavior/Discharge Planning: Goal: Ability to manage health-related needs will improve Outcome: Progressing   Problem: Clinical Measurements: Goal: Ability to maintain clinical measurements within normal limits will improve Outcome: Progressing Goal: Will remain free from infection Outcome: Progressing   Problem: Nutrition: Goal: Adequate nutrition will be maintained Outcome: Progressing   Problem: Coping: Goal: Level of anxiety will decrease Outcome: Progressing   Problem: Skin Integrity: Goal: Risk for impaired skin integrity will decrease Outcome: Progressing

## 2021-12-05 NOTE — TOC Progression Note (Signed)
Transition of Care [facility]) - Progression Note    Patient Details  Name: Melanie Ramirez MRN: [id number] Date of Birth: 12/11/1980  Transition of Care [facility]) CM/SW Contact  Angelita Ingles, RN Phone [phone]  12/05/2021, 9:51 AM  Clinical Narrative:   41 year old female admitted from home for evaluation of SOB. Currently there are no needs. TOC will follow.    Transition of Care [facility]) Screening Note   Patient Details  Name: [name] Date of Birth: 1981/04/11   Transition of Care [facility]) CM/SW Contact:    Angelita Ingles, RN Phone Number: 12/05/2021, 9:51 AM    Transition of Care Department [facility]) has reviewed patient and no TOC needs have been identified at this time. We will continue to monitor patient advancement through interdisciplinary progression rounds.           Expected Discharge Plan and Services                                                 Social Determinants of Health (SDOH) Interventions    Readmission Risk Interventions     No data to display

## 2021-12-05 NOTE — Plan of Care (Signed)
  Problem: Pain Managment: Goal: General experience of comfort will improve Outcome: Progressing   Problem: Safety: Goal: Ability to remain free from injury will improve Outcome: Progressing   

## 2021-12-05 NOTE — Progress Notes (Signed)
Events in the last day noted.  PICC line was placed in anticipation for the cancer treatment in the near future.  Multiple consultants are involved in this patient's care and multiple discussions occurred out of best obtain tissue biopsy and how to proceed with treatment.  This include consultation with Pulmonary medicine, IR, and Gyn Onc.   The general consensus is to obtain tissue biopsy from the lung for better yield but this might not be feasible due to the medical condition of the patient and her tenuous respiratory status.  Bronchoscopy might leading to intubation and percutaneous biopsy leading to severe complications that her respiratory status can handle.  After discussion with Dr. Berline Lopes who felt that biopsying the ovarian mass is not ideal but might be the last resort.  Plan is to proceed with a biopsy today and transfer to [facility] as soon as possible to resume her oncology care.  In the meantime, continue supportive management per the primary team and pulmonary medicine to optimize her respiratory status if possible.  We will continue to follow.

## 2021-12-05 NOTE — Progress Notes (Signed)
NAME:  Melanie Ramirez, MRN:  [id number], DOB:  11-Oct-1980, Ramirez: 1 ADMISSION DATE:  12/03/2021, CONSULTATION DATE:  12/04/21 REFERRING MD:  Aileen Fass CHIEF COMPLAINT:  Dyspnea, cough   History of Present Illness:  Melanie Ramirez is a 41 y.o. female who has a PMH as below. She presented to [facility] ED 6/14 with ongoing dyspnea and cough. Symptoms first started June 3 and she went to urgent care walk in clinic on 6/4 where CXR suggested infectious process / PNA. She was treated with IM Rocephin and PO Levaquin.  During that time frame she also experienced roughly 8 pound weight loss. She attributed it to decreased PO intake with the antibiotics. She has also been training for a half marathon, therefore, didn't think much of it.  In ED, she was hypoxic and was placed on venturi mask. 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 CTA chest was later obtained due to tachycardia, hypoxia, dyspnea and this was negative for PE.    Pertinent  Medical History:  has Normal labor; Abnormality of forces of labor; SVD (spontaneous vaginal delivery); Postpartum care following vaginal delivery (12/25); Anxiety; Sprain of right ankle; Anxiety and depression; Supraumbilical hernia; Umbilical hernia; Acute respiratory failure with hypoxia ([place]); Metastatic disease ([place]); and Postobstructive pneumonia on their problem list.  Significant Hospital Events: Including procedures, antibiotic start and stop dates in addition to other pertinent events   6/13 admit. 6/14 pulm consult 6/15 CT-guided core biopsy of lung mass by IR  Interim History / Subjective:   Placed on nonrebreather overnight. Denies chest pain after procedure, breathing about the  same  Objective:  Blood pressure 134/80, pulse (!) 107, temperature 97.6 F (36.4 C), temperature source Oral, resp. rate 16, last menstrual period 11/22/2021, SpO2 94 %, unknown if currently breastfeeding.        Intake/Output Summary (Last 24 hours) at 12/05/2021 1148 Last data filed at 12/05/2021 0200 Gross per 24 hour  Intake 1821.24 ml  Output 0 ml  Net 1821.24 ml   There were no vitals filed for this visit.  Examination: General: Young adult female, sitting up in bed, no distress Neuro: A&O x 3, no deficits. MAE. HEENT: [place]/AT. Sclerae anicteric. EOMI. Cardiovascular: S1-S2 tacky, regular, no murmur Lungs: No accessory muscle use, clear lungs, no rhonchi Abdomen: BS x 4, soft, NT/ND.  Musculoskeletal: No gross deformities, no edema.  Skin: Intact, warm, no rashes.   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  CTA chest 6/14 > negative for PE.  Assessment & Plan:   Acute hypoxic respiratory failure -continue oxygen as needed, this seems to be related to metastatic burden, no evidence of PE  Multiple pulmonary nodules/mediastinal lymphadenopathy with large ovarian mass and abdominal lymph nodes and abdominal lymph nodes-very suspicious for primary ovarian malignancy.  Differential includes primary lung with incidental ovarian cyst  -Multidisciplinary discussion with IR/oncology and GYN oncology -CT-guided core biopsy of lung nodule performed which she seems to have tolerated well.  I asked pathology to put a rush on this. -PICC line placed in anticipation of transfer  to [facility] for chemotherapy based on results  Anxiety - 2/2 above along with baseline hx of Anxiety/Depression. - Emotional support and  encouragement  - Continue PRN Ativan. - Continue home Bupropion / Citalopram.  Updated dad at bedside  Rest per primary team.  Best practice (evaluated daily):  Per primary team.  Labs   CBC: Recent Labs  Lab 12/03/21 1525 12/04/21 0500 12/05/21 0450  WBC 11.9* 14.3* 10.7*  NEUTROABS  --  11.0*  --   HGB 14.7 13.3 11.9*  HCT 43.4 38.8 34.4*  MCV 90.8 90.0 91.0  PLT 439* 365 335     Basic Metabolic Panel: Recent Labs  Lab 12/03/21 1525 12/04/21 0500  NA 134* 132*  K 4.3 4.9  CL 100 101  CO2 21* 20*  GLUCOSE 99 109*  BUN 11 12  CREATININE 0.70 0.74  CALCIUM 10.0 9.5    GFR: CrCl cannot be calculated (Unknown ideal weight.). Recent Labs  Lab 12/03/21 1513 12/03/21 1525 12/04/21 0500 12/05/21 0450  WBC  --  11.9* 14.3* 10.7*  LATICACIDVEN 1.7  --   --   --      Liver Function Tests: Recent Labs  Lab 12/03/21 2346 12/04/21 0500  AST 46* 45*  ALT 28 24  ALKPHOS 107 95  BILITOT 0.5 0.7  PROT 7.4 6.5  ALBUMIN 3.3* 2.9*    Recent Labs  Lab 12/03/21 2346  LIPASE 20    No results for input(s): "AMMONIA" in the last 168 hours.  ABG No results found for: "PHART", "PCO2ART", "PO2ART", "HCO3", "TCO2", "ACIDBASEDEF", "O2SAT"   Coagulation Profile: Recent Labs  Lab 12/04/21 1603  INR 1.1    Cardiac Enzymes: No results for input(s): "CKTOTAL", "CKMB", "CKMBINDEX", "TROPONINI" in the last 168 hours.  HbA1C: No results found for: "HGBA1C"  CBG: No results for input(s): "GLUCAP" in the last 168 hours.   Kara Mead MD. Shade Flood. [facility] Pager : [phone]  If no response to pager , please call [phone] until 7 pm After 7:00 pm call Elink  [phone]    12/05/2021, 11:48 AM

## 2021-12-05 NOTE — Progress Notes (Signed)
[facility] PROGRESS NOTE    Progress Note  Melanie Ramirez  LKG:[id number] DOB: 03-01-1981 DOA: 12/03/2021 PCP: Lennie Odor, PA     Brief Narrative:   Melanie Ramirez is an 41 y.o. 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  CT abdomen and pelvis showed a solid cystic mass which has replaced the left ovary measuring 7 x 11 x 9 and multiple enlarged lymph nodes within the gastropathic ligament consistent with metastatic disease.  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 CA125 is pending. CT angio of the chest was negative for PE She was started empirically on Rocephin and azithromycin as she had a consolidation on her right lung base. Consult IR for biopsy, place the patient n.p.o. After long discussion with oncology, GYN oncology, pulmonary and interventional radiologist upon general consensus tissue biopsy from the lung might be feasible by IR, bronchoscopy will be high risk due to her tenuous respiratory status that might require prolonged intubation and severe complications from a respiratory standpoint. PICC line inserted on 12/04/2021 in anticipation of starting chemotherapy as an inpatient. After biopsy performed and her respiratory status remained stable she will  be transferred to [facility]. Her respiratory status remains tenuous.  Anxiety and depression Patient is very anxious this morning, use Ativan as needed, melatonin for sleep.  Possible postobstructive pneumonia leukocytosis: She had a new consolidation in the right lung base with a leukocytosis she was started empirically on Rocephin and azithromycin and her leukocytosis is improving. She was remaining afebrile. She has remained afebrile. Continue to monitor closely for fever curve.  DVT prophylaxis: lovenox Family Communication:husband Status is: Inpatient Remains inpatient appropriate because: Acute respiratory failure with hypoxia probably due to metastatic ovarian cancer    Code Status:     Code Status Orders  (From admission, onward)           Start     Ordered   12/04/21 0051  Full code  Continuous        12/04/21 0052           Code Status History     Date Active Date Inactive Code Status Order ID Comments User Context   06/16/2014 0452 06/17/2014 1546 Full Code [id number]  Artelia Laroche, [place] Inpatient   06/15/2014 1223 06/16/2014 0452 Full Code [id number]  Artelia Laroche, CNM Inpatient         IV Access:   Peripheral IV   Procedures and diagnostic studies:   [place] EKG SITE RITE  Result Date: 12/04/2021 If Site Rite image not attached, placement could not be confirmed due to current cardiac rhythm.  CT Angio Chest Pulmonary Embolism (PE) W or WO Contrast  Result Date: 12/04/2021 CLINICAL DATA:  Chest pain or SOB, pleurisy or effusion suspected EXAM: CT ANGIOGRAPHY CHEST WITH CONTRAST TECHNIQUE: Multidetector CT imaging of the chest was performed using the standard protocol during bolus administration of intravenous contrast. Multiplanar  CT image reconstructions and MIPs were obtained to evaluate the vascular anatomy. RADIATION DOSE REDUCTION: This exam was performed according to the departmental dose-optimization program which includes  automated exposure control, adjustment of the mA and/or kV according to patient size and/or use of iterative reconstruction technique. CONTRAST:  56m OMNIPAQUE IOHEXOL 350 MG/ML SOLN COMPARISON:  12/03/2021 FINDINGS: Cardiovascular: SVC is patent. Heart size normal. No pericardial effusion. Satisfactory opacification of pulmonary arteries noted, and there is no evidence of pulmonary emboli. There is encasement and narrowing of right upper lobe pulmonary artery branch by tumor. Adequate contrast opacification of the thoracic aorta with no evidence of dissection, aneurysm, or stenosis. There is classic 3-vessel brachiocephalic arch anatomy without proximal stenosis. Mediastinum/Nodes: Bulky mediastinal and bilateral hilar adenopathy as seen on previous day's exam Lungs/Pleura: Innumerable pulmonary nodules bilaterally as before. Progressive airspace consolidation at the right lung base base. Trace left pleural effusion as before. No pneumothorax. Upper Abdomen: Stable gastrohepatic lymphadenopathy. No acute findings. Musculoskeletal: No chest wall abnormality. No acute or significant osseous findings. Review of the MIP images confirms the above findings. IMPRESSION: 1. Negative for acute PE or thoracic aortic dissection. 2. Encasement and narrowing of the right upper lobe pulmonary artery by tumor. 3. Stable bilateral pulmonary nodules, hilar and mediastinal adenopathy. 4. Progressive consolidation at the right lung base Electronically Signed   By: DLucrezia EuropeM.D.   On: 12/04/2021 10:42   CT ABDOMEN PELVIS W CONTRAST  Result Date: 12/04/2021 CLINICAL DATA:  Weight loss, unintended masses on lung CT, weight loss, lymph involvement. Pulmonary metastatic disease of unknown primary. EXAM: CT ABDOMEN AND PELVIS WITH CONTRAST TECHNIQUE: Multidetector CT imaging of the abdomen and pelvis was performed using the standard protocol following bolus administration of intravenous contrast. RADIATION DOSE REDUCTION: This exam  was performed according to the departmental dose-optimization program which includes automated exposure control, adjustment of the mA and/or kV according to patient size and/or use of iterative reconstruction technique. CONTRAST:  868mOMNIPAQUE IOHEXOL 300 MG/ML  SOLN COMPARISON:  None. Findings are correlated with CT examination of the chest of 12/03/2021. FINDINGS: Lower chest: Innumerable pulmonary masses are again identified within the visualized lung bases bilaterally, better evaluated on previously performed dedicated CT imaging of the chest. Bulky bilateral pleural metastatic disease noted. Small left pleural effusion again seen. Pericardial lymphadenopathy noted. Hepatobiliary: No enhancing intrahepatic mass. No intra or extrahepatic biliary ductal dilation. Cholelithiasis without pericholecystic inflammatory change noted. Pancreas: Unremarkable Spleen: Unremarkable Adrenals/Urinary Tract: Adrenal glands are unremarkable. Kidneys are normal, without renal calculi, focal lesion, or hydronephrosis. Bladder is unremarkable. Stomach/Bowel: Stomach is within normal limits. Appendix appears normal. No evidence of bowel wall thickening, distention, or inflammatory changes. Vascular/Lymphatic: Multiple pathologically enlarged lymph nodes are seen within the gastrohepatic ligament measuring up to 18 mm in short axis diameter at axial image # 23/3. The abdominal vasculature is unremarkable. Reproductive: A mixed solid and cystic mass replaces the left ovary measuring roughly 6.8 x 11.0 x 9.2 cm in greatest dimension. This may represent a primary ovarian mass or an ovarian metastasis. Uterus and right ovary are unremarkable. Other: No abdominal wall hernia. Musculoskeletal: No acute bone abnormality. No lytic or blastic bone lesion. IMPRESSION: 1. A mixed solid and cystic mass replaces the left ovary measuring roughly 6.8 x 11.0 x 9.2 cm in greatest dimension. This may represent a primary ovarian mass or an ovarian  metastasis. 2. Multiple pathologically enlarged lymph nodes within the gastrohepatic ligament, consistent with metastatic disease. 3. Innumerable pulmonary masses within the  visualized lung bases bilaterally, better evaluated on previously performed dedicated CT imaging of the chest. 4. Small left pleural effusion. 5. Cholelithiasis without pericholecystic inflammatory change. Electronically Signed   By: Fidela Salisbury M.D.   On: 12/04/2021 01:49   CT Chest Wo Contrast  Result Date: 12/03/2021 CLINICAL DATA:  Pneumonia suspected; * Tracking Code: BO * EXAM: CT CHEST WITHOUT CONTRAST TECHNIQUE: Multidetector CT imaging of the chest was performed following the standard protocol without IV contrast. RADIATION DOSE REDUCTION: This exam was performed according to the departmental dose-optimization program which includes automated exposure control, adjustment of the mA and/or kV according to patient size and/or use of iterative reconstruction technique. COMPARISON:  Radiograph dated November 26, 2021 FINDINGS: Cardiovascular: Normal heart size. Trace pericardial fluid. No coronary artery calcifications. Normal caliber thoracic aorta with no atherosclerotic disease. Mediastinum/Nodes: Esophagus and thyroid are unremarkable. Bulky enlarged mediastinal and bilateral hilar lymph nodes. Reference right upper paratracheal lymph node measuring 2.9 cm in short axis on series 3, image 19. Reference AP window lymph node measuring 2.7 cm in short axis on image 25. Reference right hilar lymph node measuring 2.5 cm in short axis on series 3, image 29. Reference left hilar lymph node measuring 2.4 cm in short axis on image 77. Lungs/Pleura: Central airways are patent. Innumerable bilateral solid pulmonary nodules which are most prominent in the lower lungs reference right middle lobe pulmonary nodule measuring 2.5 x 1.9 cm on series 4, image 92. Reference left lower lobe pulmonary nodule measuring 2.1 x 1.6 cm on image 113.  Perifissural/subpleural nodularity and interlobular septal thickening. Small right pleural effusion. Upper Abdomen: Enlarged upper abdominal lymph nodes. Reference gastrohepatic ligament lymph node measuring 1.5 cm in short axis on series 2, image 60. Musculoskeletal: No chest wall mass or suspicious bone lesions identified. IMPRESSION: 1. Innumerable bilateral solid pulmonary nodules which are most prominent in the lower lungs, findings are concerning for metastatic disease. 2. 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 3. Bulky mediastinal bilateral hilar lymph nodes, likely due to nodal metastatic disease. 4. Enlarged lymph nodes of the upper abdomen, concerning for additional site of nodal disease. 5. Small left pleural effusion. Electronically Signed   By: Yetta Glassman M.D.   On: 12/03/2021 16:46     Medical Consultants:   None.   Subjective:    Melanie Ramirez relates that a good night sleep still anxious cough is improved.  Objective:    Vitals:   12/04/21 1955 12/04/21 2347 12/05/21 0452 12/05/21 0742  BP: 121/65 (!) 122/56 130/74 (!) 152/72  Pulse:    (!) 124  Resp: (!) 24 (!) 24 (!) 24 (!) 22  Temp: 98.3 F (36.8 C) 98.6 F (37 C) 97.6 F (36.4 C)   TempSrc: Oral Oral Oral   SpO2: 96% 95% 95% 95%   SpO2: 95 % O2 Flow Rate (L/min): 3 L/min   Intake/Output Summary (Last 24 hours) at 12/05/2021 1610 Last data filed at 12/05/2021 0200 Gross per 24 hour  Intake 1821.24 ml  Output 0 ml  Net 1821.24 ml   There were no vitals filed for this visit.  Exam: General exam: In no acute distress. Respiratory system: Good air movement and crackles at bases Cardiovascular system: S1 & S2 heard, RRR. No JVD. Gastrointestinal system: Abdomen is nondistended, soft and nontender.  Extremities: No pedal edema. Skin: No rashes, lesions or ulcers Psychiatry: Judgement and insight appear normal. Mood & affect  appropriate.   Data Reviewed:  Labs: Basic Metabolic Panel: Recent Labs  Lab 12/03/21 1525 12/04/21 0500  NA 134* 132*  K 4.3 4.9  CL 100 101  CO2 21* 20*  GLUCOSE 99 109*  BUN 11 12  CREATININE 0.70 0.74  CALCIUM 10.0 9.5    GFR CrCl cannot be calculated (Unknown ideal weight.). Liver Function Tests: Recent Labs  Lab 12/03/21 2346 12/04/21 0500  AST 46* 45*  ALT 28 24  ALKPHOS 107 95  BILITOT 0.5 0.7  PROT 7.4 6.5  ALBUMIN 3.3* 2.9*    Recent Labs  Lab 12/03/21 2346  LIPASE 20    No results for input(s): "AMMONIA" in the last 168 hours. Coagulation profile Recent Labs  Lab 12/04/21 1603  INR 1.1   COVID-19 Labs  No results for input(s): "DDIMER", "FERRITIN", "LDH", "CRP" in the last 72 hours.  Lab Results  Component Value Date   [place] Not Detected 06/15/2019   [place] Not Detected 01/12/2019   SARSCOV2NAA NOT DETECTED 12/04/2018    CBC: Recent Labs  Lab 12/03/21 1525 12/04/21 0500 12/05/21 0450  WBC 11.9* 14.3* 10.7*  NEUTROABS  --  11.0*  --   HGB 14.7 13.3 11.9*  HCT 43.4 38.8 34.4*  MCV 90.8 90.0 91.0  PLT 439* 365 335    Cardiac Enzymes: No results for input(s): "CKTOTAL", "CKMB", "CKMBINDEX", "TROPONINI" in the last 168 hours. BNP (last 3 results) No results for input(s): "PROBNP" in the last 8760 hours. CBG: No results for input(s): "GLUCAP" in the last 168 hours. D-Dimer: No results for input(s): "DDIMER" in the last 72 hours. Hgb A1c: No results for input(s): "HGBA1C" in the last 72 hours. Lipid Profile: No results for input(s): "CHOL", "HDL", "LDLCALC", "TRIG", "CHOLHDL", "LDLDIRECT" in the last 72 hours. Thyroid function studies: No results for input(s): "TSH", "T4TOTAL", "T3FREE", "THYROIDAB" in the last 72 hours.  Invalid input(s): "FREET3" Anemia work up: No results for input(s): "VITAMINB12", "FOLATE", "FERRITIN", "TIBC", "IRON", "RETICCTPCT" in the last 72 hours. Sepsis Labs: Recent Labs  Lab  12/03/21 1513 12/03/21 1525 12/04/21 0500 12/05/21 0450  WBC  --  11.9* 14.3* 10.7*  LATICACIDVEN 1.7  --   --   --     Microbiology Recent Results (from the past 240 hour(s))  MRSA Next Gen by PCR, Nasal     Status: None   Collection Time: 12/04/21  2:14 PM   Specimen: Nasal Mucosa; Nasal Swab  Result Value Ref Range Status   MRSA by PCR Next Gen NOT DETECTED NOT DETECTED Final    Comment: (NOTE) The GeneXpert MRSA Assay (FDA approved for NASAL specimens only), is one component of a comprehensive MRSA colonization surveillance program. It is not intended to diagnose MRSA infection nor to guide or monitor treatment for MRSA infections. Test performance is not FDA approved in patients less than 76 years old. Performed at [facility], [place] [address], [place], [place] [address]      Medications:    buPROPion  300 mg Oral Daily   Chlorhexidine Gluconate Cloth  6 each Topical Daily   citalopram  40 mg Oral Daily   dextromethorphan-guaiFENesin  1 tablet Oral BID   enoxaparin (LOVENOX) injection  40 mg Subcutaneous Q24H   melatonin  3 mg Oral QHS   sodium chloride flush  10-40 mL Intracatheter Q12H   sodium chloride flush  3 mL Intravenous Q12H   traZODone  50 mg Oral QHS   Continuous Infusions:  sodium chloride 100 mL/hr at 12/05/21 0711   azithromycin  500 mg (12/04/21 1457)   cefTRIAXone (ROCEPHIN)  IV 2 g (12/04/21 1454)      LOS: 1 day   Charlynne Cousins  [facility]  12/05/2021, 8:08 AM

## 2021-12-05 NOTE — Procedures (Signed)
Interventional Radiology Procedure Note  Date of Procedure: 12/05/2021  Procedure: CT lung biopsy   Findings:  1. CT lung biopsy right lower lung subpleural nodule 18ga x4 passes    Complications: No immediate complications noted.   Estimated Blood Loss: minimal  Follow-up and Recommendations: 1. CXR 1 hour    Albin Felling, MD  Vascular & Interventional Radiology  12/05/2021 10:00 AM

## 2021-12-05 NOTE — Progress Notes (Signed)
   12/05/21 2007  Assess: MEWS Score  BP 136/82  Pulse Rate (!) 115  Resp (!) 26  SpO2 94 %  O2 Device Non-rebreather Mask  O2 Flow Rate (L/min) 15 L/min  FiO2 (%) 100 %  Assess: MEWS Score  MEWS Temp 0  MEWS Systolic 0  MEWS Pulse 2  MEWS RR 2  MEWS LOC 0  MEWS Score 4  MEWS Score Color Red  Assess: if the MEWS score is Yellow or Red  Were vital signs taken at a resting state? Yes  Focused Assessment No change from prior assessment  Does the patient meet 2 or more of the SIRS criteria? No  Does the patient have a confirmed or suspected source of infection? No  MEWS guidelines implemented *See Row Information* Yes  Treat  MEWS Interventions Administered scheduled meds/treatments;Escalated (See documentation below);Consulted Respiratory Therapy  Take Vital Signs  Increase Vital Sign Frequency  Red: Q 1hr X 4 then Q 4hr X 4, if remains red, continue Q 4hrs  Escalate  MEWS: Escalate Red: discuss with charge nurse/RN and provider, consider discussing with RRT  Notify: Charge Nurse/RN  Name of Charge Nurse/RN Notified Tanya  Date Charge Nurse/RN Notified 12/05/21  Time Charge Nurse/RN Notified 2015  Notify: Provider  Provider Name/Title Dr. Eugenia Pancoast  Date Provider Notified 12/05/21  Time Provider Notified 2015  Method of Notification Page  Notification Reason Other (Comment)  Provider response See new orders  Date of Provider Response 12/05/21  Time of Provider Response 2020  Document  Patient Outcome Stabilized after interventions  Progress note created (see row info) Yes  Assess: SIRS CRITERIA  SIRS Temperature  0  SIRS Pulse 1  SIRS Respirations  1  SIRS WBC 0  SIRS Score Sum  2

## 2021-12-06 ENCOUNTER — Inpatient Hospital Stay (HOSPITAL_COMMUNITY): Payer: BC Managed Care – PPO

## 2021-12-06 ENCOUNTER — Other Ambulatory Visit: Payer: Self-pay | Admitting: Hematology and Oncology

## 2021-12-06 DIAGNOSIS — N838 Other noninflammatory disorders of ovary, fallopian tube and broad ligament: Secondary | ICD-10-CM | POA: Diagnosis not present

## 2021-12-06 DIAGNOSIS — C7800 Secondary malignant neoplasm of unspecified lung: Secondary | ICD-10-CM | POA: Diagnosis not present

## 2021-12-06 DIAGNOSIS — J9601 Acute respiratory failure with hypoxia: Secondary | ICD-10-CM | POA: Diagnosis not present

## 2021-12-06 DIAGNOSIS — C801 Malignant (primary) neoplasm, unspecified: Secondary | ICD-10-CM | POA: Diagnosis not present

## 2021-12-06 LAB — LACTATE DEHYDROGENASE: LDH: 429 U/L — ABNORMAL HIGH (ref 98–192)

## 2021-12-06 LAB — URIC ACID: Uric Acid, Serum: 6.8 mg/dL (ref 2.5–7.1)

## 2021-12-06 MED ORDER — DEXAMETHASONE SODIUM PHOSPHATE 4 MG/ML IJ SOLN
12.0000 mg | Freq: Once | INTRAMUSCULAR | Status: AC
  Administered 2021-12-07: 12 mg via INTRAVENOUS
  Filled 2021-12-06 (×2): qty 3

## 2021-12-06 MED ORDER — SODIUM CHLORIDE 0.9 % IV SOLN: INTRAVENOUS | Status: DC | PRN

## 2021-12-06 MED ORDER — ENSURE ENLIVE PO LIQD
237.0000 mL | Freq: Two times a day (BID) | ORAL | Status: DC
  Administered 2021-12-07 – 2021-12-13 (×5): 237 mL via ORAL

## 2021-12-06 MED ORDER — PNEUMOCOCCAL 20-VAL CONJ VACC 0.5 ML IM SUSY
0.5000 mL | PREFILLED_SYRINGE | INTRAMUSCULAR | Status: AC
  Administered 2021-12-07: 0.5 mL via INTRAMUSCULAR
  Filled 2021-12-06: qty 0.5

## 2021-12-06 MED ORDER — SODIUM CHLORIDE 0.9 % IV SOLN: 2.0000 g | INTRAVENOUS | Status: DC

## 2021-12-06 MED ORDER — DEXAMETHASONE SODIUM PHOSPHATE 4 MG/ML IJ SOLN
12.0000 mg | Freq: Once | INTRAMUSCULAR | Status: AC
  Administered 2021-12-06: 12 mg via INTRAVENOUS
  Filled 2021-12-06: qty 3

## 2021-12-06 MED ORDER — AZITHROMYCIN 250 MG PO TABS
500.0000 mg | ORAL_TABLET | Freq: Every day | ORAL | Status: DC
  Administered 2021-12-06: 500 mg via ORAL
  Filled 2021-12-06: qty 1
  Filled 2021-12-06: qty 2

## 2021-12-06 MED ORDER — MORPHINE SULFATE (PF) 2 MG/ML IV SOLN
2.0000 mg | INTRAVENOUS | Status: DC | PRN
  Administered 2021-12-09: 2 mg via INTRAVENOUS
  Filled 2021-12-06: qty 1

## 2021-12-06 MED ORDER — CHLORHEXIDINE GLUCONATE CLOTH 2 % EX PADS: 6.0000 | MEDICATED_PAD | Freq: Every day | CUTANEOUS | Status: DC

## 2021-12-06 NOTE — Progress Notes (Signed)
This patient is receiving the antibiotic azithromycin by the intravenous route.   Based on criteria approved by the Pharmacy and Therapeutics Committee, and the  Infectious Disease Division, the antibiotic(s) is / are being converted to equivalent oral dose form(s). These criteria include:  Patient being treated for a respiratory tract infection, urinary tract infection, cellulitis, or Clostridium Difficile associated diarrhea  The patient is not neutropenic and does not exhibit a GI malabsorption state  The patient is eating (either orally or per tube) and/or has been taking other orally administered medications for at least 24 hours.  The patient is improving clinically (physician assessment and a 24-hour Tmax of 100.5 F).   If you have questions about this conversion, please contact the pharmacy department.   Thank you for allowing pharmacy to be a part of this patient's care.  Leona Pressly, PharmD Clinical Pharmacist 

## 2021-12-06 NOTE — Progress Notes (Signed)
Took report from Conde, RN tonight on this patient. She stated patient is to be transferred to [facility] and she called [facility] 3 times. She was told there was only one truck running today and patient was the 10th on the list. Patient is full of cancer and she has a Picc line placed for oncology. Family is getting upset for they have been waiting all day for transport and no one was available. I called Barnett Applebaum, Nurse Manager tonight and told her we needed an ambulance to get this patient to [facility]. [facility] is not available tonight. She called [facility] and got patient moved up on the list. Patient was transported to [facility] at 2043 this evening. Family and patient were very appreciative of our care.

## 2021-12-06 NOTE — Progress Notes (Signed)
I strongly suggested that patient use morphine today for breathing comfort, because it worked very well yesterday--RR and heart rate responded well and went lower, patient was more relaxed with breathing effort, patient wanted to use ativan instead so that she could sleep

## 2021-12-06 NOTE — Progress Notes (Signed)
[facility] night cross cover note:  I was notified by RN that patient continues to experience respiratory rates in the high 20s to low 30s.  O2 sats stable, noted to be in the mid to high 90s on 15 L nonrebreather, without any recent escalation in these supplemental oxygen requirements.   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  Multidisciplinary approach underway, including pulmonology consultation.  She has existing order for prn Ativan for anxiety and any associated contribution this poses towards her tachypnea.   I ordered vbg to establish baseline acid-base status and CO2 level given increased risk for ensuing respiratory fatigue. The patient also received one-time dose of morphine 2 mg IV, following which she reportedly appears much more comfortable from a respiratory standpoint, and without any associated ensuing escalation in her supplemental O2 requirements.    Babs Bertin, DO Hospitalist

## 2021-12-06 NOTE — Progress Notes (Addendum)
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 Imaging results have been discussed with the patient and findings concerning for advanced malignancy, likely GYN/ovarian primary CA125 is elevated at 296 Underwent CT-guided biopsy earlier today and will follow-up on results Preliminary results confirmed malignancy by IHC stains pending We had some initial discussion today regarding treatment options including chemotherapy If confirmed ovarian cancer, plan would be chemotherapy with carboplatin and taxol Even if this turns out to be lung cancer which is improbable, carboplatin and paclitaxel will treat Melanie Ramirez Due to the severity of Melanie Ramirez pulmonary failure, I recommend we proceed with treatment with carboplatin and paclitaxel We reviewed the NCCN guidelines We discussed the role of chemotherapy. The intent is of curative intent.  We discussed some of the risks, benefits, side-effects of carboplatin & Taxol. Treatment is intravenous, every 3 weeks x 6 cycles  Some of the short term side-effects included, though not limited to, including weight loss, life threatening infections, risk of allergic reactions, need for transfusions of blood products, nausea, vomiting, change in bowel habits, loss of hair, admission to hospital for various reasons, and risks of death.   Long term side-effects are also discussed including risks of infertility, permanent damage to nerve function, hearing loss, chronic fatigue, kidney damage with possibility needing hemodialysis, and rare secondary malignancy including bone marrow disorders.  The patient is aware that the response rates discussed earlier is not guaranteed.  After a long discussion, patient made an informed decision to proceed with the  prescribed plan of care.   Patient education material was dispensed. We discussed premedication with dexamethasone before chemotherapy. I plan to repeat imaging study after 3 cycles of treatment Even though she have stage IV disease, given Melanie Ramirez young age, and if she have platinum sensitive disease, it is not improbable to expect complete response down the road We briefly discussed expected side-effects associated with this regimen I explained to Melanie Ramirez why surgery and radiation therapy is not indicated She has a PICC line in place in anticipation of beginning chemotherapy in the near future I recommend transfer to [facility] when feasible, preferably to [address] [place] I have made arrangement with pharmacy team and chemo certified nursing staff to administer chemotherapy tomorrow She will need genetic counseling referral in the outpatient setting Reviewed CT images with the patient's husband today  Acute hypoxic respiratory failure Due to bulky disease in the lung and possible lymphangitic spread Currently requiring a nonrebreather Pulmonology following Will plan to administer chemotherapy as an inpatient  Mild normocytic anemia Noted to have mild normocytic anemia Monitor  Anxiety/depression Has as needed Ativan available to Melanie Ramirez  Goals of care Plan for chemotherapy treatment ASAP  Discharge planning Not safe for discharge until she can be discharged on 2 Liters of oxygen She needs inpatient chemotherapy due to respiratory failure  Michaelle Copas, MD  SUBJECTIVE: Melanie Ramirez is more comfortable today Now has morphine available to Melanie Ramirez for respiratory distress Remains on nonrebreather Melanie Ramirez mother is at the bedside and husband joined [place] near the end of my visit Nursing reported a scant amount of vaginal bleeding today  REVIEW OF SYSTEMS:   Review of Systems  Constitutional:  Negative for chills and fever.  HENT: Negative.    Respiratory:  Positive for shortness of breath.  Cardiovascular: Negative.   Gastrointestinal: Negative.   Skin: Negative.   Neurological: Negative.    I have reviewed the past medical history, past surgical history, social history and family history with the patient and they are unchanged from previous note.   PHYSICAL EXAMINATION: ECOG PERFORMANCE STATUS: 1 - Symptomatic but completely ambulatory  Vitals:   12/06/21 0735 12/06/21 0757  BP:  124/77  Pulse: (!) 103 (!) 103  Resp: (!) 28 20  Temp:  98.6 F (37 C)  SpO2: 95% 96%   There were no vitals filed for this visit.  Intake/Output from previous day: 06/15 0701 - 06/16 0700 In: 830 [P.O.:480; IV Piggyback:350] Out: 0   Physical Exam Vitals reviewed.  Constitutional:      Comments: Appears tachypneic  HENT:     Head: Normocephalic.  Eyes:     General: No scleral icterus. Cardiovascular:     Rate and Rhythm: Tachycardia present.  Pulmonary:     Breath sounds: Normal breath sounds.  Abdominal:     General: There is no distension.     Palpations: Abdomen is soft.  Skin:    General: Skin is warm and dry.  Neurological:     Mental Status: She is alert and oriented to person, place, and time.    LABORATORY DATA:  I have reviewed the data as listed    Latest Ref Rng & Units 12/04/2021    5:00 AM 12/03/2021   11:46 PM 12/03/2021    3:25 PM  CMP  Glucose 70 - 99 mg/dL 109   99   BUN 6 - 20 mg/dL 12   11   Creatinine 0.44 - 1.00 mg/dL 0.74   0.70   Sodium 135 - 145 mmol/L 132   134   Potassium 3.5 - 5.1 mmol/L 4.9   4.3   Chloride 98 - 111 mmol/L 101   100   CO2 22 - 32 mmol/L 20   21   Calcium 8.9 - 10.3 mg/dL 9.5   10.0   Total Protein 6.5 - 8.1 g/dL 6.5  7.4    Total Bilirubin 0.3 - 1.2 mg/dL 0.7  0.5    Alkaline Phos 38 - 126 U/L 95  107    AST 15 - 41 U/L 45  46    ALT 0 - 44 U/L 24  28      Lab Results  Component Value Date   WBC 10.7 (H) 12/05/2021   HGB 11.9 (L) 12/05/2021   HCT 34.4 (L) 12/05/2021   MCV 91.0 12/05/2021   PLT 335  12/05/2021   NEUTROABS 11.0 (H) 12/04/2021   I have reviewed Melanie Ramirez CT imaging No results found for: "CEA1", "CEA", "LGX211", "CA125", "PSA1"  CT BIOPSY  Result Date: 12/05/2021 INDICATION: Multiple lung masses EXAM: CT-guided biopsy of right lower lung mass MEDICATIONS: Per EMR ANESTHESIA/SEDATION: Local analgesia FLUOROSCOPY TIME:  N/a COMPLICATIONS: None immediate. PROCEDURE: Informed written consent was obtained from the patient after a thorough discussion of the procedural risks, benefits and alternatives. All questions were addressed. Maximal Sterile Barrier Technique was utilized including caps, mask, sterile gowns, sterile gloves, sterile drape, hand hygiene and skin antiseptic. A timeout was performed prior to the initiation of the procedure. The patient was placed in the lateral decubitus position with right side down on the CT exam table. Limited CT of the chest was performed for planning purposes. This again demonstrated innumerable bilateral pulmonary masses. Inappropriate right lower lung subpleural nodule/mass was selected as appropriate for percutaneous  biopsy. Skin entry site was marked, and the overlying skin was prepped and draped in the standard sterile fashion. Local analgesia was obtained with 1% lidocaine. Using intermittent CT fluoroscopy, a 17 gauge introducer needle was advanced towards the target subpleural nodule in the right lower lobe. Subsequently, core needle biopsy was performed using an 18 gauge core biopsy device x4 total passes. Grossly adequate appearing specimens were submitted in formalin to pathology for further handling. Needle was removed and an occlusive dressing was placed. Postprocedure imaging demonstrated small volume bleeding surrounding the biopsied mass within expected limits and found to be stable over a short monitoring interval. Patient overall tolerated the procedure well, and was transferred to recovery in stable condition. IMPRESSION: Successful CT-guided  core needle biopsy of right lower lung pulmonary nodule/mass. Electronically Signed   By: Albin Felling M.D.   On: 12/05/2021 15:02   DG Chest Port 1 View  Result Date: 12/05/2021 CLINICAL DATA:  Right lung biopsy EXAM: PORTABLE CHEST 1 VIEW COMPARISON:  11/26/2021 FINDINGS: Right-sided PICC line terminates at the level of the superior cavoatrial junction. Stable heart size. Low lung volumes. Extensive bilateral airspace opacities compatible with diffuse pulmonary metastatic disease. Prominence of the mediastinal and hilar borders compatible with underlying lymphadenopathy. Low lung volumes. No pneumothorax identified. Portion of the lung apices obscured by patient's chin. IMPRESSION: 1. No pneumothorax identified following right lung biopsy. 2. Right-sided PICC line terminates at the level of the superior cavoatrial junction. 3. Extensive bilateral airspace opacities compatible with diffuse pulmonary metastatic disease. Electronically Signed   By: Davina Poke D.O.   On: 12/05/2021 11:29   [place] EKG SITE RITE  Result Date: 12/04/2021 If Site Rite image not attached, placement could not be confirmed due to current cardiac rhythm.  CT Angio Chest Pulmonary Embolism (PE) W or WO Contrast  Result Date: 12/04/2021 CLINICAL DATA:  Chest pain or SOB, pleurisy or effusion suspected EXAM: CT ANGIOGRAPHY CHEST WITH CONTRAST TECHNIQUE: Multidetector CT imaging of the chest was performed using the standard protocol during bolus administration of intravenous contrast. Multiplanar CT image reconstructions and MIPs were obtained to evaluate the vascular anatomy. RADIATION DOSE REDUCTION: This exam was performed according to the departmental dose-optimization program which includes automated exposure control, adjustment of the mA and/or kV according to patient size and/or use of iterative reconstruction technique. CONTRAST:  81m OMNIPAQUE IOHEXOL 350 MG/ML SOLN COMPARISON:  12/03/2021 FINDINGS: Cardiovascular: SVC  is patent. Heart size normal. No pericardial effusion. Satisfactory opacification of pulmonary arteries noted, and there is no evidence of pulmonary emboli. There is encasement and narrowing of right upper lobe pulmonary artery branch by tumor. Adequate contrast opacification of the thoracic aorta with no evidence of dissection, aneurysm, or stenosis. There is classic 3-vessel brachiocephalic arch anatomy without proximal stenosis. Mediastinum/Nodes: Bulky mediastinal and bilateral hilar adenopathy as seen on previous day's exam Lungs/Pleura: Innumerable pulmonary nodules bilaterally as before. Progressive airspace consolidation at the right lung base base. Trace left pleural effusion as before. No pneumothorax. Upper Abdomen: Stable gastrohepatic lymphadenopathy. No acute findings. Musculoskeletal: No chest wall abnormality. No acute or significant osseous findings. Review of the MIP images confirms the above findings. IMPRESSION: 1. Negative for acute PE or thoracic aortic dissection. 2. Encasement and narrowing of the right upper lobe pulmonary artery by tumor. 3. Stable bilateral pulmonary nodules, hilar and mediastinal adenopathy. 4. Progressive consolidation at the right lung base Electronically Signed   By: DLucrezia EuropeM.D.   On: 12/04/2021 10:42   CT ABDOMEN PELVIS  W CONTRAST  Result Date: 12/04/2021 CLINICAL DATA:  Weight loss, unintended masses on lung CT, weight loss, lymph involvement. Pulmonary metastatic disease of unknown primary. EXAM: CT ABDOMEN AND PELVIS WITH CONTRAST TECHNIQUE: Multidetector CT imaging of the abdomen and pelvis was performed using the standard protocol following bolus administration of intravenous contrast. RADIATION DOSE REDUCTION: This exam was performed according to the departmental dose-optimization program which includes automated exposure control, adjustment of the mA and/or kV according to patient size and/or use of iterative reconstruction technique. CONTRAST:  70m  OMNIPAQUE IOHEXOL 300 MG/ML  SOLN COMPARISON:  None. Findings are correlated with CT examination of the chest of 12/03/2021. FINDINGS: Lower chest: Innumerable pulmonary masses are again identified within the visualized lung bases bilaterally, better evaluated on previously performed dedicated CT imaging of the chest. Bulky bilateral pleural metastatic disease noted. Small left pleural effusion again seen. Pericardial lymphadenopathy noted. Hepatobiliary: No enhancing intrahepatic mass. No intra or extrahepatic biliary ductal dilation. Cholelithiasis without pericholecystic inflammatory change noted. Pancreas: Unremarkable Spleen: Unremarkable Adrenals/Urinary Tract: Adrenal glands are unremarkable. Kidneys are normal, without renal calculi, focal lesion, or hydronephrosis. Bladder is unremarkable. Stomach/Bowel: Stomach is within normal limits. Appendix appears normal. No evidence of bowel wall thickening, distention, or inflammatory changes. Vascular/Lymphatic: Multiple pathologically enlarged lymph nodes are seen within the gastrohepatic ligament measuring up to 18 mm in short axis diameter at axial image # 23/3. The abdominal vasculature is unremarkable. Reproductive: A mixed solid and cystic mass replaces the left ovary measuring roughly 6.8 x 11.0 x 9.2 cm in greatest dimension. This may represent a primary ovarian mass or an ovarian metastasis. Uterus and right ovary are unremarkable. Other: No abdominal wall hernia. Musculoskeletal: No acute bone abnormality. No lytic or blastic bone lesion. IMPRESSION: 1. A mixed solid and cystic mass replaces the left ovary measuring roughly 6.8 x 11.0 x 9.2 cm in greatest dimension. This may represent a primary ovarian mass or an ovarian metastasis. 2. Multiple pathologically enlarged lymph nodes within the gastrohepatic ligament, consistent with metastatic disease. 3. Innumerable pulmonary masses within the visualized lung bases bilaterally, better evaluated on  previously performed dedicated CT imaging of the chest. 4. Small left pleural effusion. 5. Cholelithiasis without pericholecystic inflammatory change. Electronically Signed   By: AFidela SalisburyM.D.   On: 12/04/2021 01:49   CT Chest Wo Contrast  Result Date: 12/03/2021 CLINICAL DATA:  Pneumonia suspected; * Tracking Code: BO * EXAM: CT CHEST WITHOUT CONTRAST TECHNIQUE: Multidetector CT imaging of the chest was performed following the standard protocol without IV contrast. RADIATION DOSE REDUCTION: This exam was performed according to the departmental dose-optimization program which includes automated exposure control, adjustment of the mA and/or kV according to patient size and/or use of iterative reconstruction technique. COMPARISON:  Radiograph dated November 26, 2021 FINDINGS: Cardiovascular: Normal heart size. Trace pericardial fluid. No coronary artery calcifications. Normal caliber thoracic aorta with no atherosclerotic disease. Mediastinum/Nodes: Esophagus and thyroid are unremarkable. Bulky enlarged mediastinal and bilateral hilar lymph nodes. Reference right upper paratracheal lymph node measuring 2.9 cm in short axis on series 3, image 19. Reference AP window lymph node measuring 2.7 cm in short axis on image 25. Reference right hilar lymph node measuring 2.5 cm in short axis on series 3, image 29. Reference left hilar lymph node measuring 2.4 cm in short axis on image 77. Lungs/Pleura: Central airways are patent. Innumerable bilateral solid pulmonary nodules which are most prominent in the lower lungs reference right middle lobe pulmonary nodule measuring 2.5 x 1.9 cm  on series 4, image 92. Reference left lower lobe pulmonary nodule measuring 2.1 x 1.6 cm on image 113. Perifissural/subpleural nodularity and interlobular septal thickening. Small right pleural effusion. Upper Abdomen: Enlarged upper abdominal lymph nodes. Reference gastrohepatic ligament lymph node measuring 1.5 cm in short axis on series 2,  image 60. Musculoskeletal: No chest wall mass or suspicious bone lesions identified. IMPRESSION: 1. Innumerable bilateral solid pulmonary nodules which are most prominent in the lower lungs, findings are concerning for metastatic disease. 2. 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 3. Bulky mediastinal bilateral hilar lymph nodes, likely due to nodal metastatic disease. 4. Enlarged lymph nodes of the upper abdomen, concerning for additional site of nodal disease. 5. Small left pleural effusion. Electronically Signed   By: Yetta Glassman M.D.   On: 12/03/2021 16:46     Future Appointments  Date Time Provider Sac  01/21/2022 10:00 AM Rigoberto Noel, MD [facility] None      LOS: 2 days

## 2021-12-06 NOTE — Progress Notes (Signed)
START ON PATHWAY REGIMEN - Ovarian     A cycle is every 21 days:     Paclitaxel      Carboplatin   **Always confirm dose/schedule in your pharmacy ordering system**  Patient Characteristics: Preoperative or Nonsurgical Candidate (Clinical Staging), Newly Diagnosed, Neoadjuvant Therapy followed by Surgery BRCA Mutation Status: Awaiting Test Results Therapeutic Status: Preoperative or Nonsurgical Candidate (Clinical Staging) AJCC T Category: cT3 AJCC 8 Stage Grouping: IVB AJCC N Category: cN1 AJCC M Category: pM1b Therapy Plan: Neoadjuvant Therapy followed by Surgery Intent of Therapy: Curative Intent, Discussed with Patient

## 2021-12-06 NOTE — Progress Notes (Signed)
   NAME:  Melanie Ramirez, MRN:  [id number], DOB:  16-Aug-1980, LOS: 2 ADMISSION DATE:  12/03/2021, CONSULTATION DATE:  12/04/21 REFERRING MD:  Aileen Fass CHIEF COMPLAINT:  Dyspnea, cough   History of Present Illness:  Melanie Ramirez is a 41 y.o. female who has a PMH as below. She presented to [facility] ED 6/14 with ongoing dyspnea and cough. Symptoms first started June 3 and she went to urgent care walk in clinic on 6/4 where CXR suggested infectious process / PNA. She was treated with IM Rocephin and PO Levaquin.  During that time frame she also experienced roughly 8 pound weight loss. She attributed it to decreased PO intake with the antibiotics. She has also been training for a half marathon, therefore, didn't think much of it.  In ED, she was hypoxic and was placed on venturi mask. 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 CTA chest was later obtained due to tachycardia, hypoxia, dyspnea and this was negative for PE.    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  Significant Hospital Events: Including procedures, antibiotic start and stop dates in addition to other pertinent events   6/13 admit. 6/14 pulm consult 6/15 CT-guided core biopsy of lung mass by IR  Interim History / Subjective:  Remains  SOB. CXR stable. Anxiety after talking to oncology precipitated some of her decline. Breathing treatments helping. Arranging for transfer to [place] today to start chemo.  Objective:  Blood pressure 124/73, pulse 97, temperature 98.6 F (37 C), temperature source Oral, resp. rate (!) 21, last menstrual period 11/22/2021, SpO2 98 %, unknown if currently breastfeeding.    FiO2 (%):  [100 %] 100 %   Intake/Output Summary (Last 24 hours) at 12/06/2021 1235 Last data filed at 12/06/2021 0900 Gross per 24 hour  Intake 830 ml  Output 300 ml  Net 530 ml    There were no vitals filed for this visit.  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  CTA chest 6/14 > negative for PE.  Assessment & Plan:   Acute hypoxic respiratory failure -continue oxygen as needed, this seems to be related to metastatic burden, no evidence of PE  Multiple pulmonary nodules/mediastinal lymphadenopathy with large ovarian mass and abdominal lymph nodes and abdominal lymph nodes-very suspicious for primary ovarian malignancy.    Acute on chronic anxiety  - Continue nebs as scheduled - Wean O2 for sats >90% - Given tenuous resp status will leave on our list to monitor while at [facility]

## 2021-12-06 NOTE — Progress Notes (Addendum)
[facility] PROGRESS NOTE    Progress Note  Melanie Ramirez  YOV:[id number] DOB: 21-Jan-1981 DOA: 12/03/2021 PCP: Lennie Odor, PA     Brief Narrative:   Melanie Ramirez is an 41 y.o. 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  CT abdomen and pelvis showed a solid cystic mass which has replaced the left ovary measuring 7 x 11 x 9 and multiple enlarged lymph nodes within the gastropathic ligament consistent with metastatic disease.  CT angio of the chest was negative for PE showed consolidation she was at empirically on IV Rocephin  Assessment/Plan:   Acute respiratory failure with hypoxia/possible postobstructive pneumonia likely due to metastatic ovarian cancer: Awaiting nonrebreather try to keep saturations greater 90%. CA125 is 286 IR has been consulted and she is status post lung biopsy which results are pending Allow a diet. PICC line inserted on 12/04/2021 in anticipation of starting chemotherapy as an inpatient. Transferred to [facility] to start chemotherapy. Her respiratory status remains tenuous. Added low-dose morphine to help with respiration.  Anxiety and depression Patient is very anxious this morning, use Ativan as needed, melatonin for sleep.  Possible postobstructive pneumonia leukocytosis: Continue IV Rocephin and azithromycin and her leukocytosis is improving. She was remaining  afebrile. She has remained afebrile. Continue to monitor closely for fever curve.  Hyponatremia: likely due to primary pulmonary disease.  DVT prophylaxis: lovenox Family Communication:husband Status is: Inpatient Remains inpatient appropriate because: Acute respiratory failure with hypoxia probably due to metastatic ovarian cancer transferred to [facility]    Code Status:     Code Status Orders  (From admission, onward)           Start     Ordered   12/04/21 0051  Full code  Continuous        12/04/21 0052           Code Status History     Date Active Date Inactive Code Status Order ID Comments User Context   06/16/2014 0452 06/17/2014 1546 Full Code [id number]  Artelia Laroche, [place] Inpatient   06/15/2014 1223 06/16/2014 0452 Full Code [id number]  Artelia Laroche, CNM Inpatient         IV Access:   Peripheral IV   Procedures and diagnostic studies:   DG Chest Port 1 View  Result Date: 12/06/2021 CLINICAL DATA:  Provided history: Acute respiratory failure with hypoxia. Shortness of breath. EXAM: PORTABLE CHEST 1 VIEW COMPARISON:  Chest radiograph 12/05/2021 and earlier. Chest CT 12/04/2021. FINDINGS: Unchanged position of a right-sided PICC with tip projecting at the level of the superior cavoatrial junction. The cardiomediastinal silhouette is unchanged. Known mediastinal and hilar lymphadenopathy. Again demonstrated are extensive mid to lower lung zone predominant opacities compatible with pulmonary metastatic disease. Superimposed pneumonia cannot be excluded. Findings are similar to the prior chest radiograph of 12/05/2021. A portion of the left lateral costophrenic angle is excluded from the field of view. Small bilateral pleural effusions. No evidence of pneumothorax. IMPRESSION: No significant change from the prior  chest radiograph 12/05/2021. Redemonstrated extensive mid-to-lower lung zone predominant opacities compatible with pulmonary metastatic  disease. Superimposed pneumonia cannot be excluded. Small bilateral pleural effusions. Electronically Signed   By: Kellie Simmering D.O.   On: 12/06/2021 09:14   CT BIOPSY  Result Date: 12/05/2021 INDICATION: Multiple lung masses EXAM: CT-guided biopsy of right lower lung mass MEDICATIONS: Per EMR ANESTHESIA/SEDATION: Local analgesia FLUOROSCOPY TIME:  N/a COMPLICATIONS: None immediate. PROCEDURE: Informed written consent was obtained from the patient after a thorough discussion of the procedural risks, benefits and alternatives. All questions were addressed. Maximal Sterile Barrier Technique was utilized including caps, mask, sterile gowns, sterile gloves, sterile drape, hand hygiene and skin antiseptic. A timeout was performed prior to the initiation of the procedure. The patient was placed in the lateral decubitus position with right side down on the CT exam table. Limited CT of the chest was performed for planning purposes. This again demonstrated innumerable bilateral pulmonary masses. Inappropriate right lower lung subpleural nodule/mass was selected as appropriate for percutaneous biopsy. Skin entry site was marked, and the overlying skin was prepped and draped in the standard sterile fashion. Local analgesia was obtained with 1% lidocaine. Using intermittent CT fluoroscopy, a 17 gauge introducer needle was advanced towards the target subpleural nodule in the right lower lobe. Subsequently, core needle biopsy was performed using an 18 gauge core biopsy device x4 total passes. Grossly adequate appearing specimens were submitted in formalin to pathology for further handling. Needle was removed and an occlusive dressing was placed. Postprocedure imaging demonstrated small volume bleeding surrounding the biopsied mass within expected limits and found to be stable over a short monitoring interval. Patient overall tolerated the procedure well, and was transferred to recovery in stable condition. IMPRESSION:  Successful CT-guided core needle biopsy of right lower lung pulmonary nodule/mass. Electronically Signed   By: Albin Felling M.D.   On: 12/05/2021 15:02   DG Chest Port 1 View  Result Date: 12/05/2021 CLINICAL DATA:  Right lung biopsy EXAM: PORTABLE CHEST 1 VIEW COMPARISON:  11/26/2021 FINDINGS: Right-sided PICC line terminates at the level of the superior cavoatrial junction. Stable heart size. Low lung volumes. Extensive bilateral airspace opacities compatible with diffuse pulmonary metastatic disease. Prominence of the mediastinal and hilar borders compatible with underlying lymphadenopathy. Low lung volumes. No pneumothorax identified. Portion of the lung apices obscured by patient's chin. IMPRESSION: 1. No pneumothorax identified following right lung biopsy. 2. Right-sided PICC line terminates at the level of the superior cavoatrial junction. 3. Extensive bilateral airspace opacities compatible with diffuse pulmonary metastatic disease. Electronically Signed   By: Davina Poke D.O.   On: 12/05/2021 11:29   [place] EKG SITE RITE  Result Date: 12/04/2021 If Site Rite image not attached, placement could not be confirmed due to current cardiac rhythm.  CT Angio Chest Pulmonary Embolism (PE) W or WO Contrast  Result Date: 12/04/2021 CLINICAL DATA:  Chest pain or SOB, pleurisy or effusion suspected EXAM: CT ANGIOGRAPHY CHEST WITH CONTRAST TECHNIQUE: Multidetector CT imaging of the chest was performed using the standard protocol during bolus administration of intravenous contrast. Multiplanar CT image reconstructions and MIPs were obtained to evaluate the vascular anatomy. RADIATION DOSE REDUCTION: This exam was performed according to the departmental dose-optimization program which includes automated exposure control, adjustment of the mA and/or kV according to patient size and/or use of iterative reconstruction technique. CONTRAST:  48m OMNIPAQUE IOHEXOL 350 MG/ML SOLN COMPARISON:  12/03/2021 FINDINGS:  Cardiovascular: SVC is patent. Heart size normal. No pericardial effusion. Satisfactory opacification of  pulmonary arteries noted, and there is no evidence of pulmonary emboli. There is encasement and narrowing of right upper lobe pulmonary artery branch by tumor. Adequate contrast opacification of the thoracic aorta with no evidence of dissection, aneurysm, or stenosis. There is classic 3-vessel brachiocephalic arch anatomy without proximal stenosis. Mediastinum/Nodes: Bulky mediastinal and bilateral hilar adenopathy as seen on previous day's exam Lungs/Pleura: Innumerable pulmonary nodules bilaterally as before. Progressive airspace consolidation at the right lung base base. Trace left pleural effusion as before. No pneumothorax. Upper Abdomen: Stable gastrohepatic lymphadenopathy. No acute findings. Musculoskeletal: No chest wall abnormality. No acute or significant osseous findings. Review of the MIP images confirms the above findings. IMPRESSION: 1. Negative for acute PE or thoracic aortic dissection. 2. Encasement and narrowing of the right upper lobe pulmonary artery by tumor. 3. Stable bilateral pulmonary nodules, hilar and mediastinal adenopathy. 4. Progressive consolidation at the right lung base Electronically Signed   By: Lucrezia Europe M.D.   On: 12/04/2021 10:42     Medical Consultants:   None.   Subjective:    Melanie Ramirez relates her breathing is about the same.  Objective:    Vitals:   12/05/21 2300 12/06/21 0300 12/06/21 0735 12/06/21 0757  BP: 139/73 128/76  124/77  Pulse:   (!) 103 (!) 103  Resp: (!) 25 (!) 24 (!) 28 20  Temp: 99 F (37.2 C) 98.6 F (37 C)  98.6 F (37 C)  TempSrc: Oral Oral  Oral  SpO2: 95% 96% 95% 96%   SpO2: 96 % O2 Flow Rate (L/min): 15 L/min (7L salter with a 15L NRB for comfort.) FiO2 (%): 100 %   Intake/Output Summary (Last 24 hours) at 12/06/2021 0939 Last data filed at 12/06/2021 0400 Gross per 24 hour  Intake 830 ml  Output 0 ml  Net  830 ml    There were no vitals filed for this visit.  Exam: General exam: In no acute distress. Respiratory system: Good air movement and clear to auscultation. Cardiovascular system: S1 & S2 heard, RRR. No JVD. Gastrointestinal system: Abdomen is nondistended, soft and nontender.  Extremities: No pedal edema. Skin: No rashes, lesions or ulcers Psychiatry: Judgement and insight appear normal. Mood & affect appropriate.   Data Reviewed:    Labs: Basic Metabolic Panel: Recent Labs  Lab 12/03/21 1525 12/04/21 0500  NA 134* 132*  K 4.3 4.9  CL 100 101  CO2 21* 20*  GLUCOSE 99 109*  BUN 11 12  CREATININE 0.70 0.74  CALCIUM 10.0 9.5    GFR CrCl cannot be calculated (Unknown ideal weight.). Liver Function Tests: Recent Labs  Lab 12/03/21 2346 12/04/21 0500  AST 46* 45*  ALT 28 24  ALKPHOS 107 95  BILITOT 0.5 0.7  PROT 7.4 6.5  ALBUMIN 3.3* 2.9*    Recent Labs  Lab 12/03/21 2346  LIPASE 20    No results for input(s): "AMMONIA" in the last 168 hours. Coagulation profile Recent Labs  Lab 12/04/21 1603  INR 1.1    COVID-19 Labs  Recent Labs    12/06/21 0340  LDH 429*    Lab Results  Component Value Date   SARSCOV2NAA NEGATIVE 12/05/2021   [place] Not Detected 06/15/2019   [place] Not Detected 01/12/2019   SARSCOV2NAA NOT DETECTED 12/04/2018    CBC: Recent Labs  Lab 12/03/21 1525 12/04/21 0500 12/05/21 0450  WBC 11.9* 14.3* 10.7*  NEUTROABS  --  11.0*  --   HGB 14.7 13.3 11.9*  HCT 43.4 38.8 34.4*  MCV 90.8 90.0 91.0  PLT 439* 365 335    Cardiac Enzymes: No results for input(s): "CKTOTAL", "CKMB", "CKMBINDEX", "TROPONINI" in the last 168 hours. BNP (last 3 results) No results for input(s): "PROBNP" in the last 8760 hours. CBG: No results for input(s): "GLUCAP" in the last 168 hours. D-Dimer: No results for input(s): "DDIMER" in the last 72 hours. Hgb A1c: No results for input(s): "HGBA1C" in the last 72 hours. Lipid  Profile: No results for input(s): "CHOL", "HDL", "LDLCALC", "TRIG", "CHOLHDL", "LDLDIRECT" in the last 72 hours. Thyroid function studies: No results for input(s): "TSH", "T4TOTAL", "T3FREE", "THYROIDAB" in the last 72 hours.  Invalid input(s): "FREET3" Anemia work up: No results for input(s): "VITAMINB12", "FOLATE", "FERRITIN", "TIBC", "IRON", "RETICCTPCT" in the last 72 hours. Sepsis Labs: Recent Labs  Lab 12/03/21 1513 12/03/21 1525 12/04/21 0500 12/05/21 0450  WBC  --  11.9* 14.3* 10.7*  LATICACIDVEN 1.7  --   --   --     Microbiology Recent Results (from the past 240 hour(s))  MRSA Next Gen by PCR, Nasal     Status: None   Collection Time: 12/04/21  2:14 PM   Specimen: Nasal Mucosa; Nasal Swab  Result Value Ref Range Status   MRSA by PCR Next Gen NOT DETECTED NOT DETECTED Final    Comment: (NOTE) The GeneXpert MRSA Assay (FDA approved for NASAL specimens only), is one component of a comprehensive MRSA colonization surveillance program. It is not intended to diagnose MRSA infection nor to guide or monitor treatment for MRSA infections. Test performance is not FDA approved in patients less than 39 years old. Performed at [facility], [place] [address] [place], [place] [address]   SARS Coronavirus 2 by RT PCR (hospital order, performed in [facility] hospital lab) *cepheid single result test* Anterior Nasal Swab     Status: None   Collection Time: 12/05/21  7:26 AM   Specimen: Anterior Nasal Swab  Result Value Ref Range Status   SARS Coronavirus 2 by RT PCR NEGATIVE NEGATIVE Final    Comment: (NOTE) SARS-CoV-2 target nucleic acids are NOT DETECTED.  The SARS-CoV-2 RNA is generally detectable in upper and lower respiratory specimens during the acute phase of infection. The lowest concentration of SARS-CoV-2 viral copies this assay can detect is 250 copies / mL. A negative result does not preclude SARS-CoV-2 infection and should not be used as the sole basis for  treatment or other patient management decisions.  A negative result may occur with improper specimen collection / handling, submission of specimen other than nasopharyngeal swab, presence of viral mutation(s) within the areas targeted by this assay, and inadequate number of viral copies (<250 copies / mL). A negative result must be combined with clinical observations, patient history, and epidemiological information.  Fact Sheet for Patients:   https://www.patel.info/  Fact Sheet for Healthcare Providers: https://hall.com/  This test is not yet approved or  cleared by the [place] FDA and has been authorized for detection and/or diagnosis of SARS-CoV-2 by FDA under an Emergency Use Authorization (EUA).  This EUA will remain in effect (meaning this test can be used) for the duration of the COVID-19 declaration under Section 564(b)(1) of the Act, 21 U.S.C. section 360bbb-3(b)(1), unless the authorization is terminated or revoked sooner.  Performed at [facility], [place] [address] [place], [place] [address]      Medications:    buPROPion  300 mg Oral Daily   Chlorhexidine Gluconate Cloth  6 each Topical  Daily   citalopram  40 mg Oral Daily   dextromethorphan-guaiFENesin  1 tablet Oral BID   enoxaparin (LOVENOX) injection  40 mg Subcutaneous Q24H   ipratropium-albuterol  3 mL Nebulization TID   melatonin  3 mg Oral QHS   sodium chloride flush  10-40 mL Intracatheter Q12H   sodium chloride flush  3 mL Intravenous Q12H   traZODone  50 mg Oral QHS   Continuous Infusions:  azithromycin 500 mg (12/05/21 1647)   cefTRIAXone (ROCEPHIN)  IV 2 g (12/05/21 1542)      LOS: 2 days   Charlynne Cousins  [facility]  12/06/2021, 9:39 AM

## 2021-12-07 DIAGNOSIS — J9601 Acute respiratory failure with hypoxia: Secondary | ICD-10-CM | POA: Diagnosis not present

## 2021-12-07 LAB — CBC WITH DIFFERENTIAL/PLATELET
Abs Immature Granulocytes: 0.21 10*3/uL — ABNORMAL HIGH (ref 0.00–0.07)
Basophils Absolute: 0 10*3/uL (ref 0.0–0.1)
Basophils Relative: 0 %
Eosinophils Absolute: 0 10*3/uL (ref 0.0–0.5)
Eosinophils Relative: 0 %
HCT: 33.4 % — ABNORMAL LOW (ref 36.0–46.0)
Hemoglobin: 11.5 g/dL — ABNORMAL LOW (ref 12.0–15.0)
Immature Granulocytes: 2 %
Lymphocytes Relative: 8 %
Lymphs Abs: 0.8 10*3/uL (ref 0.7–4.0)
MCH: 30.7 pg (ref 26.0–34.0)
MCHC: 34.4 g/dL (ref 30.0–36.0)
MCV: 89.3 fL (ref 80.0–100.0)
Monocytes Absolute: 0.6 10*3/uL (ref 0.1–1.0)
Monocytes Relative: 7 %
Neutro Abs: 7.7 10*3/uL (ref 1.7–7.7)
Neutrophils Relative %: 83 %
Platelets: 351 10*3/uL (ref 150–400)
RBC: 3.74 MIL/uL — ABNORMAL LOW (ref 3.87–5.11)
RDW: 11.5 % (ref 11.5–15.5)
WBC: 9.4 10*3/uL (ref 4.0–10.5)
nRBC: 0 % (ref 0.0–0.2)

## 2021-12-07 LAB — COMPREHENSIVE METABOLIC PANEL
ALT: 18 U/L (ref 0–44)
AST: 28 U/L (ref 15–41)
Albumin: 2.8 g/dL — ABNORMAL LOW (ref 3.5–5.0)
Alkaline Phosphatase: 93 U/L (ref 38–126)
Anion gap: 7 (ref 5–15)
BUN: 13 mg/dL (ref 6–20)
CO2: 26 mmol/L (ref 22–32)
Calcium: 8.9 mg/dL (ref 8.9–10.3)
Chloride: 104 mmol/L (ref 98–111)
Creatinine, Ser: 0.58 mg/dL (ref 0.44–1.00)
GFR, Estimated: >60 mL/min (ref >60)
Glucose, Bld: 147 mg/dL — ABNORMAL HIGH (ref 70–99)
Potassium: 4.3 mmol/L (ref 3.5–5.1)
Sodium: 137 mmol/L (ref 135–145)
Total Bilirubin: 0.4 mg/dL (ref 0.3–1.2)
Total Protein: 6.8 g/dL (ref 6.5–8.1)

## 2021-12-07 LAB — PROCALCITONIN: Procalcitonin: 0.1 ng/mL

## 2021-12-07 LAB — MAGNESIUM: Magnesium: 2.3 mg/dL (ref 1.7–2.4)

## 2021-12-07 MED ORDER — PALONOSETRON HCL INJECTION 0.25 MG/5ML
0.2500 mg | Freq: Once | INTRAVENOUS | Status: AC
  Administered 2021-12-07: 0.25 mg via INTRAVENOUS
  Filled 2021-12-07: qty 5

## 2021-12-07 MED ORDER — PHENOL 1.4 % MT LIQD
1.0000 | OROMUCOSAL | Status: DC | PRN
  Filled 2021-12-07: qty 177

## 2021-12-07 MED ORDER — CHLORHEXIDINE GLUCONATE CLOTH 2 % EX PADS
6.0000 | MEDICATED_PAD | Freq: Every day | CUTANEOUS | Status: DC
  Administered 2021-12-08 – 2021-12-14 (×6): 6 via TOPICAL

## 2021-12-07 MED ORDER — SODIUM CHLORIDE 0.9 % IV SOLN
300.0000 mg | Freq: Once | INTRAVENOUS | Status: AC
  Administered 2021-12-07: 300 mg via INTRAVENOUS
  Filled 2021-12-07: qty 50

## 2021-12-07 MED ORDER — COLD PACK MISC ONCOLOGY: 1.0000 | Freq: Once | Status: AC | PRN

## 2021-12-07 MED ORDER — MORPHINE SULFATE 10 MG/5ML PO SOLN
5.0000 mg | ORAL | Status: DC | PRN
Start: 2021-12-07 — End: 2021-12-07

## 2021-12-07 MED ORDER — FAMOTIDINE IN NACL 20-0.9 MG/50ML-% IV SOLN
20.0000 mg | Freq: Once | INTRAVENOUS | Status: AC
  Administered 2021-12-07: 20 mg via INTRAVENOUS
  Filled 2021-12-07: qty 50

## 2021-12-07 MED ORDER — DOCUSATE SODIUM 100 MG PO CAPS
100.0000 mg | ORAL_CAPSULE | Freq: Every day | ORAL | Status: DC
  Administered 2021-12-07 – 2021-12-08 (×2): 100 mg via ORAL
  Filled 2021-12-07 (×4): qty 1

## 2021-12-07 MED ORDER — MORPHINE SULFATE 10 MG/5ML PO SOLN
2.5000 mg | ORAL | Status: DC | PRN
  Administered 2021-12-07: 2.5 mg via ORAL
  Filled 2021-12-07 (×2): qty 5

## 2021-12-07 MED ORDER — SODIUM CHLORIDE 0.9 % IV SOLN
750.0000 mg | Freq: Once | INTRAVENOUS | Status: AC
  Administered 2021-12-07: 750 mg via INTRAVENOUS
  Filled 2021-12-07: qty 45

## 2021-12-07 MED ORDER — MENTHOL 3 MG MT LOZG
1.0000 | LOZENGE | OROMUCOSAL | Status: DC | PRN
  Filled 2021-12-07: qty 9

## 2021-12-07 MED ORDER — SODIUM CHLORIDE 0.9 % IV SOLN: Freq: Once | INTRAVENOUS | Status: AC

## 2021-12-07 MED ORDER — SODIUM CHLORIDE 0.9 % IV SOLN
150.0000 mg | Freq: Once | INTRAVENOUS | Status: AC
  Administered 2021-12-07: 150 mg via INTRAVENOUS
  Filled 2021-12-07: qty 5

## 2021-12-07 MED ORDER — MORPHINE SULFATE 10 MG/5ML PO SOLN: 5.0000 mg | ORAL | Status: DC | PRN

## 2021-12-07 MED ORDER — DIPHENHYDRAMINE HCL 50 MG/ML IJ SOLN
25.0000 mg | Freq: Once | INTRAMUSCULAR | Status: AC
  Administered 2021-12-07: 25 mg via INTRAVENOUS
  Filled 2021-12-07: qty 1

## 2021-12-07 NOTE — Progress Notes (Signed)
Patient noted to have sudden episode of desaturation. Patient went from 93% on 12L [place] to 85%. Patient provided with breathing treatment and HFNC titrated to 15L. Patient states relief with treatment, but saturation dropped to 88-89% on the 15L. Patient provided with a non-rebreather at 10L with saturation increasing to 96%. Respiratory notified and at bedside.

## 2021-12-07 NOTE — Progress Notes (Signed)
Patient received chemotherapy today. She received taxol/ carboplatin treatment. Patient tolerated treatment well with no issues. VSS during treatment. Floor Nurse aware that treatment is finished.

## 2021-12-07 NOTE — Progress Notes (Signed)
NAME:  Melanie Ramirez, MRN:  [id number], DOB:  1981-01-28, LOS: 3 ADMISSION DATE:  12/03/2021, CONSULTATION DATE:  12/04/21 REFERRING MD:  Aileen Fass CHIEF COMPLAINT:  Dyspnea, cough   BRIEF  Melanie Ramirez is a 41 y.o. female who has a PMH as below. She presented to [facility] ED 6/14 with ongoing dyspnea and cough. Symptoms first started June 3 and she went to urgent care walk in clinic on 6/4 where CXR suggested infectious process / PNA. She was treated with IM Rocephin and PO Levaquin.  During that time frame she also experienced roughly 8 pound weight loss. She attributed it to decreased PO intake with the antibiotics. She has also been training for a half marathon, therefore, didn't think much of it.  In ED, she was hypoxic and was placed on venturi mask. 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 CTA chest was later obtained due to tachycardia, hypoxia, dyspnea and this was negative for PE.    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  Significant Hospital Events: Including procedures, antibiotic start and stop dates in addition to other pertinent events   6/13 admit. 6/14 pulm consult 6/15 CT-guided core biopsy of lung mass by IR.  MRSA PCR negative. 6/16 - Remains SOB. CXR stable. Anxiety after  talking to oncology precipitated some of her decline. Breathing treatments helping. Arranging for transfer to [place] today to start chemo.  -Oncology consultation differential diagnosis ovarian cancer [elevated CA125] over lung cancer with lymphangitic spread.  Carboplatin Taxol chemotherapy considered.  Interim History / Subjective:   6/17 -now at [facility] long ICU.  On 14 L high flow nasal cannula.  Blood pressure fine.  Not on pressors.  Denies any pain. Afebrile. On Abx.  She has gotten some morphine for dyspnea and lorazepam for anxiety.  Thought to be anxious this morning by the hospitalist. On 12L [place]. Did not want morphine this AM for dyspnea.  STarting chemo today. Has PICC. On CAP antibiotics but no fever   Objective:  Blood pressure 140/61, pulse (!) 103, temperature 97.9 F (36.6 C), resp. rate (!) 31, height '5\' 5"'$  (1.651 m), weight 71.2 kg, last menstrual period 11/22/2021, SpO2 92 %, unknown if currently breastfeeding.    FiO2 (%):  [100 %] 100 %   Intake/Output Summary (Last 24 hours) at 12/07/2021 0854 Last data filed at 12/07/2021 0659 Gross per 24 hour  Intake 761.48 ml  Output 300 ml  Net 461.48 ml   Filed Weights   12/06/21 2144  Weight: 71.2 kg    Examination: General Appearance:  Looks anxious and mildly labored. Thin Head:  Normocephalic, without obvious abnormality, atraumatic Eyes:  PERRL - yes, conjunctiva/corneas - clear     Ears:  Normal external ear canals, both ears Nose:  G tube - HFNC 12L Throat:  ETT TUBE - no , OG tube - no Neck:  Supple,  No enlargement/tenderness/nodules Lungs: Clear to auscultation bilaterally, Tachypneic MILD. Not  paradoical. Crackles at Lef base > Right base Heart:  S1 and S2 normal, no murmur, CVP - no.  Pressors - no Abdomen:  Soft, no masses, no organomegaly Genitalia / Rectal:  Not done Extremities:  Extremities- intact Skin:  ntact in exposed areas . Sacral area - intact Neurologic:  Sedation - intact -> RASS - +1 . Moves  all 4s - yes. CAM-ICU - neg . 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  CTA chest 6/14 > negative for PE.  Assessment & Plan:   Acute hypoxic respiratory failure   Multiple pulmonary nodules/mediastinal lymphadenopathy with large ovarian mass and abdominal lymph nodes and abdominal lymph nodes-very suspicious for primary ovarian malignancy.    Acute on chronic anxiety  6/17 - on 12L [place]. Class 3-4 dyspnea. Did not want morphine. Clinical picuture not c/w pneumonia  Plan - Continue nebs as scheduled - Wean O2 for sats >90% - morphine prn for dyspnea - but currently refused - await improivement with chemo - can do BiPAP for resp distress if persists or declines  -intubate if worse - dc antibiotics  - check PCT, urine strep, urine leg, RVP - if negative continue to stay off antibiotics  PCCM will check every few days  D/w Dr Eliseo Squires of [facility]  ATTESTATION & SIGNATURE    Dr. Brand Males, M.D., [facility].C.P Pulmonary and Critical Care Medicine Medical Director - [facility] ICU Staff Physician, [place] [facility] Pager: [phone], If no answer or between  15:00h - 7:00h: call [phone]  12/07/2021 9:00 AM    LABS    PULMONARY Recent Labs  Lab 12/05/21 2005  HCO3 24.9  O2SAT 73.2    CBC Recent Labs  Lab 12/04/21 0500 12/05/21 0450 12/07/21 0335  HGB 13.3 11.9* 11.5*  HCT 38.8 34.4* 33.4*  WBC 14.3* 10.7* 9.4  PLT 365 335 351    COAGULATION Recent Labs  Lab 12/04/21 1603  INR 1.1    CARDIAC  No results for input(s): "TROPONINI" in the last 168 hours. No results for input(s): "PROBNP" in the last 168  hours.   CHEMISTRY Recent Labs  Lab 12/03/21 1525 12/04/21 0500 12/07/21 0335  NA 134* 132* 137  K 4.3 4.9 4.3  CL 100 101 104  CO2 21* 20* 26  GLUCOSE 99 109* 147*  BUN '11 12 13  '$ CREATININE 0.70 0.74 0.58  CALCIUM 10.0 9.5 8.9  MG  --   --  2.3   Estimated Creatinine Clearance: 93.5 mL/min (by C-G formula based on SCr of 0.58 mg/dL).   LIVER Recent Labs  Lab 12/03/21 2346 12/04/21 0500 12/04/21 1603 12/07/21 0335  AST 46* 45*  --  28  ALT 28 24  --  18  ALKPHOS 107 95  --  93  BILITOT 0.5 0.7  --  0.4  PROT 7.4 6.5  --  6.8  ALBUMIN 3.3* 2.9*  --  2.8*  INR  --   --  1.1  --      INFECTIOUS Recent Labs  Lab 12/03/21 1513  LATICACIDVEN 1.7     ENDOCRINE CBG (last 3)  No results for input(s): "GLUCAP" in the last 72 hours.  IMAGING x48h  - image(s) personally visualized  -   highlighted in bold DG Chest Port 1 View  Result Date: 12/06/2021 CLINICAL DATA:  Provided history: Acute respiratory failure with hypoxia. Shortness of breath. EXAM: PORTABLE CHEST 1 VIEW COMPARISON:  Chest radiograph 12/05/2021 and earlier. Chest CT 12/04/2021. FINDINGS: Unchanged position of a right-sided PICC with tip projecting at the level of the superior cavoatrial junction. The cardiomediastinal silhouette is unchanged. Known mediastinal and hilar lymphadenopathy. Again demonstrated are extensive mid to lower lung zone predominant opacities compatible with pulmonary metastatic disease. Superimposed pneumonia cannot be excluded. Findings are similar to the prior chest radiograph of 12/05/2021. A portion of the left lateral costophrenic angle is excluded from the field of view. Small bilateral pleural effusions. No evidence of pneumothorax. IMPRESSION: No significant change from the prior chest radiograph 12/05/2021. Redemonstrated extensive mid-to-lower lung zone predominant opacities compatible with pulmonary metastatic disease. Superimposed pneumonia cannot be excluded.  Small bilateral pleural effusions. Electronically Signed   By: Kellie Simmering D.O.   On: 12/06/2021 09:14   CT BIOPSY  Result Date: 12/05/2021 INDICATION: Multiple lung masses EXAM: CT-guided biopsy of right lower lung mass MEDICATIONS: Per EMR ANESTHESIA/SEDATION: Local analgesia FLUOROSCOPY TIME:  N/a COMPLICATIONS: None immediate. PROCEDURE: Informed written consent was obtained from the patient after a thorough discussion of the procedural risks, benefits and alternatives. All questions were addressed. Maximal Sterile Barrier Technique was utilized including caps, mask, sterile gowns, sterile gloves, sterile drape, hand hygiene and skin antiseptic. A timeout was performed prior to the initiation of the procedure. The patient was placed in the lateral decubitus position with right side down on the CT exam table. Limited CT of the chest was performed for planning purposes. This again demonstrated innumerable bilateral pulmonary masses. Inappropriate right lower lung subpleural nodule/mass was selected as appropriate for percutaneous biopsy. Skin entry site was marked, and the overlying skin was prepped and draped in the standard sterile fashion. Local analgesia was obtained with 1% lidocaine. Using intermittent CT fluoroscopy, a 17 gauge introducer needle was advanced towards the target subpleural nodule in the right lower lobe. Subsequently, core needle biopsy was performed using an 18 gauge core biopsy device x4 total passes. Grossly adequate appearing specimens were submitted in formalin to pathology for further handling. Needle was removed and an occlusive dressing was placed. Postprocedure imaging demonstrated small volume bleeding surrounding the biopsied mass within expected limits and found to be stable over a short monitoring interval. Patient overall tolerated the procedure well, and was transferred to recovery in stable condition. IMPRESSION: Successful CT-guided core needle biopsy of right lower lung  pulmonary nodule/mass. Electronically Signed   By: Albin Felling M.D.   On: 12/05/2021 15:02   DG Chest Port 1 View  Result Date: 12/05/2021 CLINICAL DATA:  Right lung biopsy EXAM: PORTABLE CHEST 1 VIEW COMPARISON:  11/26/2021 FINDINGS: Right-sided PICC line terminates at the level of the superior cavoatrial junction. Stable heart size. Low lung volumes. Extensive bilateral airspace opacities compatible with diffuse pulmonary metastatic disease. Prominence of the mediastinal and hilar borders compatible with underlying lymphadenopathy. Low lung volumes. No pneumothorax identified. Portion of the lung apices obscured by patient's chin. IMPRESSION: 1. No pneumothorax identified following right lung biopsy. 2. Right-sided PICC line terminates at the level of the superior cavoatrial junction. 3. Extensive bilateral airspace opacities compatible with diffuse pulmonary metastatic disease. Electronically Signed   By: Davina Poke D.O.   On: 12/05/2021 11:29

## 2021-12-07 NOTE — Progress Notes (Signed)
Came back to check on patient.  Agreeable to QHS/prn bipap to assist with breathing.  Also agreeable to a trial of PO morphine as well.    Eulogio Bear DO

## 2021-12-07 NOTE — Progress Notes (Signed)
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 CA125 is elevated at 296 Preliminary results confirmed malignancy by IHC stains pending I reviewed plan of care again with the patient and her husband I have made arrangement for her to proceed with chemotherapy today Due to high burden of disease, I will order a uric acid daily to monitor for for tumor lysis We reviewed side effects of treatment again She will need genetic counseling referral in the outpatient setting   Acute hypoxic respiratory failure Due to bulky disease in the lung and possible lymphangitic spread She is doing a bit better without nonrebreather but on high flow oxygen Pulmonology following   Mild normocytic anemia Noted to have mild normocytic anemia Monitor   Anxiety/depression Has as needed Ativan available to her   Goals of care Plan for chemotherapy treatment ASAP   Discharge planning Not safe for discharge until she can be discharged on 2 Liters of oxygen She needs inpatient chemotherapy due to respiratory failure I will stop by and check on her again tomorrow All questions were answered. The patient knows to call the clinic with any problems, questions or concerns.   The total time spent in the appointment was 55 minutes encounter with patients including review of chart and various tests results, discussions about plan of care and coordination of care plan  Heath Lark, MD 12/07/2021 8:52 AM  Subjective:  The patient was transferred to [facility] overnight She is breathing better now She is no longer on nonrebreather and on high flow oxygen only She has occasional cough Her husband is by bedside We reviewed plan of  care, side effects to be expected and others  Objective:  Vitals:   12/07/21 0739 12/07/21 0800  BP:  140/61  Pulse:  (!) 103  Resp:  (!) 31  Temp: 97.9 F (36.6 C)   SpO2:  92%     Intake/Output Summary (Last 24 hours) at 12/07/2021 0852 Last data filed at 12/07/2021 0659 Gross per 24 hour  Intake 761.48 ml  Output 300 ml  Net 461.48 ml    GENERAL:alert, no distress and comfortable SNEURO: alert & oriented x 3 with fluent speech, no focal motor/sensory deficits   Labs:  Recent Labs    12/03/21 1525 12/03/21 2346 12/04/21 0500 12/07/21 0335  NA 134*  --  132* 137  K 4.3  --  4.9 4.3  CL 100  --  101 104  CO2 21*  --  20* 26  GLUCOSE 99  --  109* 147*  BUN 11  --  12 13  CREATININE 0.70  --  0.74 0.58  CALCIUM 10.0  --  9.5 8.9  GFRNONAA >60  --  >60 >60  PROT  --  7.4 6.5 6.8  ALBUMIN  --  3.3* 2.9* 2.8*  AST  --  46* 45* 28  ALT  --  '28 24 18  '$ ALKPHOS  --  107 95 93  BILITOT  --  0.5 0.7 0.4  BILIDIR  --  <0.1  --   --   IBILI  --  NOT CALCULATED  --   --     Studies:  DG Chest Port 1 View  Result Date: 12/06/2021 CLINICAL DATA:  Provided history: Acute  respiratory failure with hypoxia. Shortness of breath. EXAM: PORTABLE CHEST 1 VIEW COMPARISON:  Chest radiograph 12/05/2021 and earlier. Chest CT 12/04/2021. FINDINGS: Unchanged position of a right-sided PICC with tip projecting at the level of the superior cavoatrial junction. The cardiomediastinal silhouette is unchanged. Known mediastinal and hilar lymphadenopathy. Again demonstrated are extensive mid to lower lung zone predominant opacities compatible with pulmonary metastatic disease. Superimposed pneumonia cannot be excluded. Findings are similar to the prior chest radiograph of 12/05/2021. A portion of the left lateral costophrenic angle is excluded from the field of view. Small bilateral pleural effusions. No evidence of pneumothorax. IMPRESSION: No significant change from the prior chest radiograph  12/05/2021. Redemonstrated extensive mid-to-lower lung zone predominant opacities compatible with pulmonary metastatic disease. Superimposed pneumonia cannot be excluded. Small bilateral pleural effusions. Electronically Signed   By: Kellie Simmering D.O.   On: 12/06/2021 09:14   CT BIOPSY  Result Date: 12/05/2021 INDICATION: Multiple lung masses EXAM: CT-guided biopsy of right lower lung mass MEDICATIONS: Per EMR ANESTHESIA/SEDATION: Local analgesia FLUOROSCOPY TIME:  N/a COMPLICATIONS: None immediate. PROCEDURE: Informed written consent was obtained from the patient after a thorough discussion of the procedural risks, benefits and alternatives. All questions were addressed. Maximal Sterile Barrier Technique was utilized including caps, mask, sterile gowns, sterile gloves, sterile drape, hand hygiene and skin antiseptic. A timeout was performed prior to the initiation of the procedure. The patient was placed in the lateral decubitus position with right side down on the CT exam table. Limited CT of the chest was performed for planning purposes. This again demonstrated innumerable bilateral pulmonary masses. Inappropriate right lower lung subpleural nodule/mass was selected as appropriate for percutaneous biopsy. Skin entry site was marked, and the overlying skin was prepped and draped in the standard sterile fashion. Local analgesia was obtained with 1% lidocaine. Using intermittent CT fluoroscopy, a 17 gauge introducer needle was advanced towards the target subpleural nodule in the right lower lobe. Subsequently, core needle biopsy was performed using an 18 gauge core biopsy device x4 total passes. Grossly adequate appearing specimens were submitted in formalin to pathology for further handling. Needle was removed and an occlusive dressing was placed. Postprocedure imaging demonstrated small volume bleeding surrounding the biopsied mass within expected limits and found to be stable over a short monitoring interval.  Patient overall tolerated the procedure well, and was transferred to recovery in stable condition. IMPRESSION: Successful CT-guided core needle biopsy of right lower lung pulmonary nodule/mass. Electronically Signed   By: Albin Felling M.D.   On: 12/05/2021 15:02   DG Chest Port 1 View  Result Date: 12/05/2021 CLINICAL DATA:  Right lung biopsy EXAM: PORTABLE CHEST 1 VIEW COMPARISON:  11/26/2021 FINDINGS: Right-sided PICC line terminates at the level of the superior cavoatrial junction. Stable heart size. Low lung volumes. Extensive bilateral airspace opacities compatible with diffuse pulmonary metastatic disease. Prominence of the mediastinal and hilar borders compatible with underlying lymphadenopathy. Low lung volumes. No pneumothorax identified. Portion of the lung apices obscured by patient's chin. IMPRESSION: 1. No pneumothorax identified following right lung biopsy. 2. Right-sided PICC line terminates at the level of the superior cavoatrial junction. 3. Extensive bilateral airspace opacities compatible with diffuse pulmonary metastatic disease. Electronically Signed   By: Davina Poke D.O.   On: 12/05/2021 11:29   [place] EKG SITE RITE  Result Date: 12/04/2021 If Site Rite image not attached, placement could not be confirmed due to current cardiac rhythm.  CT Angio Chest Pulmonary Embolism (PE) W or WO Contrast  Result  Date: 12/04/2021 CLINICAL DATA:  Chest pain or SOB, pleurisy or effusion suspected EXAM: CT ANGIOGRAPHY CHEST WITH CONTRAST TECHNIQUE: Multidetector CT imaging of the chest was performed using the standard protocol during bolus administration of intravenous contrast. Multiplanar CT image reconstructions and MIPs were obtained to evaluate the vascular anatomy. RADIATION DOSE REDUCTION: This exam was performed according to the departmental dose-optimization program which includes automated exposure control, adjustment of the mA and/or kV according to patient size and/or use of  iterative reconstruction technique. CONTRAST:  33m OMNIPAQUE IOHEXOL 350 MG/ML SOLN COMPARISON:  12/03/2021 FINDINGS: Cardiovascular: SVC is patent. Heart size normal. No pericardial effusion. Satisfactory opacification of pulmonary arteries noted, and there is no evidence of pulmonary emboli. There is encasement and narrowing of right upper lobe pulmonary artery branch by tumor. Adequate contrast opacification of the thoracic aorta with no evidence of dissection, aneurysm, or stenosis. There is classic 3-vessel brachiocephalic arch anatomy without proximal stenosis. Mediastinum/Nodes: Bulky mediastinal and bilateral hilar adenopathy as seen on previous day's exam Lungs/Pleura: Innumerable pulmonary nodules bilaterally as before. Progressive airspace consolidation at the right lung base base. Trace left pleural effusion as before. No pneumothorax. Upper Abdomen: Stable gastrohepatic lymphadenopathy. No acute findings. Musculoskeletal: No chest wall abnormality. No acute or significant osseous findings. Review of the MIP images confirms the above findings. IMPRESSION: 1. Negative for acute PE or thoracic aortic dissection. 2. Encasement and narrowing of the right upper lobe pulmonary artery by tumor. 3. Stable bilateral pulmonary nodules, hilar and mediastinal adenopathy. 4. Progressive consolidation at the right lung base Electronically Signed   By: DLucrezia EuropeM.D.   On: 12/04/2021 10:42   CT ABDOMEN PELVIS W CONTRAST  Result Date: 12/04/2021 CLINICAL DATA:  Weight loss, unintended masses on lung CT, weight loss, lymph involvement. Pulmonary metastatic disease of unknown primary. EXAM: CT ABDOMEN AND PELVIS WITH CONTRAST TECHNIQUE: Multidetector CT imaging of the abdomen and pelvis was performed using the standard protocol following bolus administration of intravenous contrast. RADIATION DOSE REDUCTION: This exam was performed according to the departmental dose-optimization program which includes automated  exposure control, adjustment of the mA and/or kV according to patient size and/or use of iterative reconstruction technique. CONTRAST:  836mOMNIPAQUE IOHEXOL 300 MG/ML  SOLN COMPARISON:  None. Findings are correlated with CT examination of the chest of 12/03/2021. FINDINGS: Lower chest: Innumerable pulmonary masses are again identified within the visualized lung bases bilaterally, better evaluated on previously performed dedicated CT imaging of the chest. Bulky bilateral pleural metastatic disease noted. Small left pleural effusion again seen. Pericardial lymphadenopathy noted. Hepatobiliary: No enhancing intrahepatic mass. No intra or extrahepatic biliary ductal dilation. Cholelithiasis without pericholecystic inflammatory change noted. Pancreas: Unremarkable Spleen: Unremarkable Adrenals/Urinary Tract: Adrenal glands are unremarkable. Kidneys are normal, without renal calculi, focal lesion, or hydronephrosis. Bladder is unremarkable. Stomach/Bowel: Stomach is within normal limits. Appendix appears normal. No evidence of bowel wall thickening, distention, or inflammatory changes. Vascular/Lymphatic: Multiple pathologically enlarged lymph nodes are seen within the gastrohepatic ligament measuring up to 18 mm in short axis diameter at axial image # 23/3. The abdominal vasculature is unremarkable. Reproductive: A mixed solid and cystic mass replaces the left ovary measuring roughly 6.8 x 11.0 x 9.2 cm in greatest dimension. This may represent a primary ovarian mass or an ovarian metastasis. Uterus and right ovary are unremarkable. Other: No abdominal wall hernia. Musculoskeletal: No acute bone abnormality. No lytic or blastic bone lesion. IMPRESSION: 1. A mixed solid and cystic mass replaces the left ovary measuring roughly 6.8 x  11.0 x 9.2 cm in greatest dimension. This may represent a primary ovarian mass or an ovarian metastasis. 2. Multiple pathologically enlarged lymph nodes within the gastrohepatic ligament,  consistent with metastatic disease. 3. Innumerable pulmonary masses within the visualized lung bases bilaterally, better evaluated on previously performed dedicated CT imaging of the chest. 4. Small left pleural effusion. 5. Cholelithiasis without pericholecystic inflammatory change. Electronically Signed   By: Fidela Salisbury M.D.   On: 12/04/2021 01:49   CT Chest Wo Contrast  Result Date: 12/03/2021 CLINICAL DATA:  Pneumonia suspected; * Tracking Code: BO * EXAM: CT CHEST WITHOUT CONTRAST TECHNIQUE: Multidetector CT imaging of the chest was performed following the standard protocol without IV contrast. RADIATION DOSE REDUCTION: This exam was performed according to the departmental dose-optimization program which includes automated exposure control, adjustment of the mA and/or kV according to patient size and/or use of iterative reconstruction technique. COMPARISON:  Radiograph dated November 26, 2021 FINDINGS: Cardiovascular: Normal heart size. Trace pericardial fluid. No coronary artery calcifications. Normal caliber thoracic aorta with no atherosclerotic disease. Mediastinum/Nodes: Esophagus and thyroid are unremarkable. Bulky enlarged mediastinal and bilateral hilar lymph nodes. Reference right upper paratracheal lymph node measuring 2.9 cm in short axis on series 3, image 19. Reference AP window lymph node measuring 2.7 cm in short axis on image 25. Reference right hilar lymph node measuring 2.5 cm in short axis on series 3, image 29. Reference left hilar lymph node measuring 2.4 cm in short axis on image 77. Lungs/Pleura: Central airways are patent. Innumerable bilateral solid pulmonary nodules which are most prominent in the lower lungs reference right middle lobe pulmonary nodule measuring 2.5 x 1.9 cm on series 4, image 92. Reference left lower lobe pulmonary nodule measuring 2.1 x 1.6 cm on image 113. Perifissural/subpleural nodularity and interlobular septal thickening. Small right pleural effusion. Upper  Abdomen: Enlarged upper abdominal lymph nodes. Reference gastrohepatic ligament lymph node measuring 1.5 cm in short axis on series 2, image 60. Musculoskeletal: No chest wall mass or suspicious bone lesions identified. IMPRESSION: 1. Innumerable bilateral solid pulmonary nodules which are most prominent in the lower lungs, findings are concerning for metastatic disease. 2. 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 3. Bulky mediastinal bilateral hilar lymph nodes, likely due to nodal metastatic disease. 4. Enlarged lymph nodes of the upper abdomen, concerning for additional site of nodal disease. 5. Small left pleural effusion. Electronically Signed   By: Yetta Glassman M.D.   On: 12/03/2021 16:46

## 2021-12-07 NOTE — Progress Notes (Signed)
[facility] PROGRESS NOTE    Progress Note  Melanie Ramirez  TKZ:[id number] DOB: 07-21-1980 DOA: 12/03/2021 PCP: Lennie Odor, PA     Brief Narrative:   Melanie Ramirez is an 41 y.o. 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  CT abdomen and pelvis showed a solid cystic mass which has replaced the left ovary measuring 7 x 11 x 9 and multiple enlarged lymph nodes within the gastropathic ligament consistent with metastatic disease.  CT angio of the chest was negative for PE showed consolidation she was at empirically on IV Rocephin.  Thought to have metastatic ovarian cancer and transferred to [facility] for initiation of chemotherapy.    Assessment/Plan:   Acute respiratory failure with hypoxia/possible postobstructive pneumonia likely due to metastatic ovarian cancer: -wean O2 as able-- currently on 12L CA125 is 286 IR: status post lung biopsy which results are pending PICC line inserted on 12/04/2021 in anticipation of starting chemotherapy as an inpatient. 6/16: Transferred to [facility] long hospital to start chemotherapy for 6/17 -Dr. Alvy Bimler appreciated Her respiratory status remains tenuous. - low-dose morphine to help with respiration. -ativan for anxiety -PCCM following as well  Anxiety and depression -very anxious again this morning, use Ativan as needed, melatonin for sleep.  Possible postobstructive pneumonia  leukocytosis: Continue IV Rocephin and azithromycin and her leukocytosis is improving. She has remained afebrile. Continue to monitor closely for fever curve.  Hyponatremia: likely due to primary pulmonary disease.   Plan to leave in ICU until O2 requirements are closer to 4-6L  DVT prophylaxis: lovenox Family Communication: none at bedside Status is: Inpatient Remains inpatient appropriate because: Acute respiratory failure with hypoxia probably due to metastatic ovarian cancer needing inpatient chemo    Code Status:     full    IV Access:   Peripheral IV   Procedures and diagnostic studies:   DG Chest Port 1 View  Result Date: 12/06/2021 CLINICAL DATA:  Provided history: Acute respiratory failure with hypoxia. Shortness of breath. EXAM: PORTABLE CHEST 1 VIEW COMPARISON:  Chest radiograph 12/05/2021 and earlier. Chest CT 12/04/2021. FINDINGS: Unchanged position of a right-sided PICC with tip projecting at the level of the superior cavoatrial junction. The cardiomediastinal silhouette is unchanged. Known mediastinal and hilar lymphadenopathy. Again demonstrated are extensive mid to lower lung zone predominant opacities compatible with pulmonary metastatic disease. Superimposed pneumonia cannot be excluded. Findings are similar to the prior chest radiograph of 12/05/2021. A portion of the left lateral costophrenic angle is excluded from the field of view. Small bilateral pleural effusions. No evidence of pneumothorax. IMPRESSION: No significant change from the prior chest radiograph 12/05/2021. Redemonstrated extensive mid-to-lower lung zone predominant opacities compatible with pulmonary metastatic disease. Superimposed pneumonia cannot be excluded. Small bilateral pleural effusions. Electronically Signed   By: Kellie Simmering D.O.   On: 12/06/2021 09:14   CT BIOPSY  Result Date: 12/05/2021 INDICATION: Multiple lung masses EXAM: CT-guided biopsy of right lower lung mass MEDICATIONS:  Per EMR ANESTHESIA/SEDATION: Local analgesia FLUOROSCOPY TIME:  N/a COMPLICATIONS: None  immediate. PROCEDURE: Informed written consent was obtained from the patient after a thorough discussion of the procedural risks, benefits and alternatives. All questions were addressed. Maximal Sterile Barrier Technique was utilized including caps, mask, sterile gowns, sterile gloves, sterile drape, hand hygiene and skin antiseptic. A timeout was performed prior to the initiation of the procedure. The patient was placed in the lateral decubitus position with right side down on the CT exam table. Limited CT of the chest was performed for planning purposes. This again demonstrated innumerable bilateral pulmonary masses. Inappropriate right lower lung subpleural nodule/mass was selected as appropriate for percutaneous biopsy. Skin entry site was marked, and the overlying skin was prepped and draped in the standard sterile fashion. Local analgesia was obtained with 1% lidocaine. Using intermittent CT fluoroscopy, a 17 gauge introducer needle was advanced towards the target subpleural nodule in the right lower lobe. Subsequently, core needle biopsy was performed using an 18 gauge core biopsy device x4 total passes. Grossly adequate appearing specimens were submitted in formalin to pathology for further handling. Needle was removed and an occlusive dressing was placed. Postprocedure imaging demonstrated small volume bleeding surrounding the biopsied mass within expected limits and found to be stable over a short monitoring interval. Patient overall tolerated the procedure well, and was transferred to recovery in stable condition. IMPRESSION: Successful CT-guided core needle biopsy of right lower lung pulmonary nodule/mass. Electronically Signed   By: Albin Felling M.D.   On: 12/05/2021 15:02   DG Chest Port 1 View  Result Date: 12/05/2021 CLINICAL DATA:  Right lung biopsy EXAM: PORTABLE CHEST 1 VIEW COMPARISON:  11/26/2021  FINDINGS: Right-sided PICC line terminates at the level of the superior cavoatrial junction. Stable heart size. Low lung volumes. Extensive bilateral airspace opacities compatible with diffuse pulmonary metastatic disease. Prominence of the mediastinal and hilar borders compatible with underlying lymphadenopathy. Low lung volumes. No pneumothorax identified. Portion of the lung apices obscured by patient's chin. IMPRESSION: 1. No pneumothorax identified following right lung biopsy. 2. Right-sided PICC line terminates at the level of the superior cavoatrial junction. 3. Extensive bilateral airspace opacities compatible with diffuse pulmonary metastatic disease. Electronically Signed   By: Davina Poke D.O.   On: 12/05/2021 11:29     Medical Consultants:   PCCM Oncology IR   Subjective:    Says her anxiety may make her breathing worse  Objective:    Vitals:   12/07/21 0400 12/07/21 0500 12/07/21 0600 12/07/21 0700  BP: 117/65     Pulse: 82 (!) 108 90 96  Resp: 20 (!) 33 (!) 23 19  Temp: 98.4 F (36.9 C)     TempSrc: Oral     SpO2: 96% 95% 97% 97%  Weight:      Height:       SpO2: 97 % O2 Flow Rate (L/min): 12 L/min FiO2 (%): 100 %   Intake/Output Summary (Last 24 hours) at 12/07/2021 0735 Last data filed at 12/07/2021 0630 Gross per 24 hour  Intake 761.48 ml  Output 300 ml  Net 461.48 ml   Filed Weights   12/06/21 2144  Weight: 71.2 kg    Exam:  General: Appearance:     Overweight female in no acute distress     Lungs:    Diminished, no wheezing  Heart:    Normal heart rate.   MS:   All extremities are intact.   Neurologic:   Awake, alert      Data Reviewed:    Labs: Basic Metabolic Panel: Recent  Labs  Lab 12/03/21 1525 12/04/21 0500 12/07/21 0335  NA 134* 132* 137  K 4.3 4.9 4.3  CL 100 101 104  CO2 21* 20* 26  GLUCOSE 99 109* 147*  BUN '11 12 13  '$ CREATININE 0.70 0.74 0.58  CALCIUM 10.0 9.5 8.9  MG  --   --  2.3   GFR Estimated  Creatinine Clearance: 93.5 mL/min (by C-G formula based on SCr of 0.58 mg/dL). Liver Function Tests: Recent Labs  Lab 12/03/21 2346 12/04/21 0500 12/07/21 0335  AST 46* 45* 28  ALT '28 24 18  '$ ALKPHOS 107 95 93  BILITOT 0.5 0.7 0.4  PROT 7.4 6.5 6.8  ALBUMIN 3.3* 2.9* 2.8*   Recent Labs  Lab 12/03/21 2346  LIPASE 20   No results for input(s): "AMMONIA" in the last 168 hours. Coagulation profile Recent Labs  Lab 12/04/21 1603  INR 1.1   COVID-19 Labs  Recent Labs    12/06/21 0340  LDH 429*    Lab Results  Component Value Date   SARSCOV2NAA NEGATIVE 12/05/2021   [place] Not Detected 06/15/2019   [place] Not Detected 01/12/2019   SARSCOV2NAA NOT DETECTED 12/04/2018    CBC: Recent Labs  Lab 12/03/21 1525 12/04/21 0500 12/05/21 0450 12/07/21 0335  WBC 11.9* 14.3* 10.7* 9.4  NEUTROABS  --  11.0*  --  7.7  HGB 14.7 13.3 11.9* 11.5*  HCT 43.4 38.8 34.4* 33.4*  MCV 90.8 90.0 91.0 89.3  PLT 439* 365 335 351   Cardiac Enzymes: No results for input(s): "CKTOTAL", "CKMB", "CKMBINDEX", "TROPONINI" in the last 168 hours. BNP (last 3 results) No results for input(s): "PROBNP" in the last 8760 hours. CBG: No results for input(s): "GLUCAP" in the last 168 hours. D-Dimer: No results for input(s): "DDIMER" in the last 72 hours. Hgb A1c: No results for input(s): "HGBA1C" in the last 72 hours. Lipid Profile: No results for input(s): "CHOL", "HDL", "LDLCALC", "TRIG", "CHOLHDL", "LDLDIRECT" in the last 72 hours. Thyroid function studies: No results for input(s): "TSH", "T4TOTAL", "T3FREE", "THYROIDAB" in the last 72 hours.  Invalid input(s): "FREET3" Anemia work up: No results for input(s): "VITAMINB12", "FOLATE", "FERRITIN", "TIBC", "IRON", "RETICCTPCT" in the last 72 hours. Sepsis Labs: Recent Labs  Lab 12/03/21 1513 12/03/21 1525 12/04/21 0500 12/05/21 0450 12/07/21 0335  WBC  --  11.9* 14.3* 10.7* 9.4  LATICACIDVEN 1.7  --   --   --   --     Microbiology Recent Results (from the past 240 hour(s))  MRSA Next Gen by PCR, Nasal     Status: None   Collection Time: 12/04/21  2:14 PM   Specimen: Nasal Mucosa; Nasal Swab  Result Value Ref Range Status   MRSA by PCR Next Gen NOT DETECTED NOT DETECTED Final    Comment: (NOTE) The GeneXpert MRSA Assay (FDA approved for NASAL specimens only), is one component of a comprehensive MRSA colonization surveillance program. It is not intended to diagnose MRSA infection nor to guide or monitor treatment for MRSA infections. Test performance is not FDA approved in patients less than 17 years old. Performed at [facility], [place] [address], [place], [place] [address]   SARS Coronavirus 2 by RT PCR (hospital order, performed in [facility] hospital lab) *cepheid single result test* Anterior Nasal Swab     Status: None   Collection Time: 12/05/21  7:26 AM   Specimen: Anterior Nasal Swab  Result Value Ref Range Status   SARS Coronavirus 2 by RT PCR NEGATIVE NEGATIVE  Final    Comment: (NOTE) SARS-CoV-2 target nucleic acids are NOT DETECTED.  The SARS-CoV-2 RNA is generally detectable in upper and lower respiratory specimens during the acute phase of infection. The lowest concentration of SARS-CoV-2 viral copies this assay can detect is 250 copies / mL. A negative result does not preclude SARS-CoV-2 infection and should not be used as the sole basis for treatment or other patient management decisions.  A negative result may occur with improper specimen collection / handling, submission of specimen other than nasopharyngeal swab, presence of viral mutation(s) within the areas targeted by this assay, and inadequate number of viral copies (<250 copies / mL). A negative result must be combined with clinical observations, patient history, and epidemiological information.  Fact Sheet for Patients:   https://www.patel.info/  Fact Sheet for Healthcare  Providers: https://hall.com/  This test is not yet approved or  cleared by the [place] FDA and has been authorized for detection and/or diagnosis of SARS-CoV-2 by FDA under an Emergency Use Authorization (EUA).  This EUA will remain in effect (meaning this test can be used) for the duration of the COVID-19 declaration under Section 564(b)(1) of the Act, 21 U.S.C. section 360bbb-3(b)(1), unless the authorization is terminated or revoked sooner.  Performed at [facility], [place] [address], [place], [place] [address]      Medications:    azithromycin  500 mg Oral Daily   buPROPion  300 mg Oral Daily   Chlorhexidine Gluconate Cloth  6 each Topical Daily   citalopram  40 mg Oral Daily   dexamethasone  12 mg Intravenous Once   dextromethorphan-guaiFENesin  1 tablet Oral BID   enoxaparin (LOVENOX) injection  40 mg Subcutaneous Q24H   feeding supplement  237 mL Oral BID BM   ipratropium-albuterol  3 mL Nebulization TID   melatonin  3 mg Oral QHS   pneumococcal 20-valent conjugate vaccine  0.5 mL Intramuscular Tomorrow-1000   traZODone  50 mg Oral QHS   Continuous Infusions:  sodium chloride 10 mL/hr at 12/07/21 0659   cefTRIAXone (ROCEPHIN)  IV        LOS: 3 days   Geradine Girt  [facility]  12/07/2021, 7:35 AM

## 2021-12-07 NOTE — Progress Notes (Signed)
[facility] floor coverage for both [facility] and [facility] (remote) on night of 12/06/21 into morning of 12/07/21:    I was notified by RN that the patient is without a.m. lab orders for this morning.  I subsequently placed orders for CMP, CBC, serum magnesium level.      Babs Bertin, DO Hospitalist

## 2021-12-08 DIAGNOSIS — J9601 Acute respiratory failure with hypoxia: Secondary | ICD-10-CM | POA: Diagnosis not present

## 2021-12-08 LAB — CBC
HCT: 33.6 % — ABNORMAL LOW (ref 36.0–46.0)
Hemoglobin: 11.5 g/dL — ABNORMAL LOW (ref 12.0–15.0)
MCH: 30.6 pg (ref 26.0–34.0)
MCHC: 34.2 g/dL (ref 30.0–36.0)
MCV: 89.4 fL (ref 80.0–100.0)
Platelets: 448 10*3/uL — ABNORMAL HIGH (ref 150–400)
RBC: 3.76 MIL/uL — ABNORMAL LOW (ref 3.87–5.11)
RDW: 11.7 % (ref 11.5–15.5)
WBC: 17.2 10*3/uL — ABNORMAL HIGH (ref 4.0–10.5)
nRBC: 0 % (ref 0.0–0.2)

## 2021-12-08 LAB — BASIC METABOLIC PANEL
Anion gap: 8 (ref 5–15)
BUN: 20 mg/dL (ref 6–20)
CO2: 24 mmol/L (ref 22–32)
Calcium: 8.9 mg/dL (ref 8.9–10.3)
Chloride: 108 mmol/L (ref 98–111)
Creatinine, Ser: 0.62 mg/dL (ref 0.44–1.00)
GFR, Estimated: >60 mL/min (ref >60)
Glucose, Bld: 117 mg/dL — ABNORMAL HIGH (ref 70–99)
Potassium: 4.3 mmol/L (ref 3.5–5.1)
Sodium: 140 mmol/L (ref 135–145)

## 2021-12-08 LAB — RESPIRATORY PANEL BY PCR

## 2021-12-08 LAB — GLUCOSE, CAPILLARY: Glucose-Capillary: 126 mg/dL — ABNORMAL HIGH (ref 70–99)

## 2021-12-08 LAB — STREP PNEUMONIAE URINARY ANTIGEN: Strep Pneumo Urinary Antigen: NEGATIVE

## 2021-12-08 LAB — URIC ACID: Uric Acid, Serum: 7.3 mg/dL — ABNORMAL HIGH (ref 2.5–7.1)

## 2021-12-08 MED ORDER — SODIUM CHLORIDE 0.9 % IV SOLN
3.0000 mg | Freq: Once | INTRAVENOUS | Status: AC
  Administered 2021-12-08: 3 mg via INTRAVENOUS
  Filled 2021-12-08: qty 2

## 2021-12-08 NOTE — Progress Notes (Signed)
[facility] PROGRESS NOTE    Progress Note  Melanie Ramirez  NWG:[id number] DOB: 1980/06/29 DOA: 12/03/2021 PCP: Lennie Odor, PA     Brief Narrative:   Melanie Ramirez is an 41 y.o. 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  CT abdomen and pelvis showed a solid cystic mass which has replaced the left ovary measuring 7 x 11 x 9 and multiple enlarged lymph nodes within the gastropathic ligament consistent with metastatic disease.  CT angio of the chest was negative for PE showed consolidation she was at empirically on IV Rocephin.  Thought to have metastatic ovarian cancer and transferred to [facility] for initiation of chemotherapy.    Assessment/Plan:   Acute respiratory failure with hypoxia/possible postobstructive pneumonia likely due to metastatic ovarian cancer: -wean O2 as able -tolerated Bipap last PM well- will use QHS and PRN CA125 is 286 IR: status post lung biopsy which results are pending PICC line inserted on 12/04/2021 in anticipation of starting chemotherapy as an inpatient. 6/16: Transferred to [facility] long hospital to start chemotherapy for 6/17 -Dr. Alvy Bimler appreciated - low-dose morphine to help with respiration. -ativan for anxiety -PCCM following as well  Anxiety and depression - Ativan as needed, melatonin for sleep. -resume home meds  Possible postobstructive pneumonia leukocytosis: D/c abx -pro  calcitonin low -NP swab pending  Hyponatremia:  -resolved   Plan to leave in SDI/ICU until O2 requirements are closer to 4-6L  DVT prophylaxis: lovenox Family Communication: none at bedside Status is: Inpatient Remains inpatient appropriate because: Acute respiratory failure with hypoxia probably due to metastatic ovarian cancer needing inpatient chemo    Code Status:     full    Procedures and diagnostic studies:   DG Chest Port 1 View  Result Date: 12/06/2021 CLINICAL DATA:  Provided history: Acute respiratory failure with hypoxia. Shortness of breath. EXAM: PORTABLE CHEST 1 VIEW COMPARISON:  Chest radiograph 12/05/2021 and earlier. Chest CT 12/04/2021. FINDINGS: Unchanged position of a right-sided PICC with tip projecting at the level of the superior cavoatrial junction. The cardiomediastinal silhouette is unchanged. Known mediastinal and hilar lymphadenopathy. Again demonstrated are extensive mid to lower lung zone predominant opacities compatible with pulmonary metastatic disease. Superimposed pneumonia cannot be excluded. Findings are similar to the prior chest radiograph of 12/05/2021. A portion of the left lateral costophrenic angle is excluded from the field of view. Small bilateral pleural effusions. No evidence of pneumothorax. IMPRESSION: No significant change from the prior chest radiograph 12/05/2021. Redemonstrated extensive mid-to-lower lung zone predominant opacities compatible with pulmonary metastatic disease. Superimposed pneumonia cannot be excluded. Small bilateral pleural effusions. Electronically Signed   By: Kellie Simmering D.O.   On: 12/06/2021 09:14     Medical Consultants:   PCCM Oncology IR   Subjective:    Had an episode of low O2 sat last night- ? Mucous plug  Objective:    Vitals:   12/08/21 0600 12/08/21 0620 12/08/21 0700 12/08/21 0800  BP: 129/74   105/79  Pulse: 87 91 84 85  Resp:  (!) 24  20  Temp:      TempSrc:      SpO2: 92% 93%  95% 97%  Weight:      Height:       SpO2: 97 % O2 Flow Rate (L/min): 12 L/min FiO2 (%): 50 %   Intake/Output Summary (Last 24 hours) at 12/08/2021 0816 Last data filed at 12/08/2021 0428 Gross per 24 hour  Intake 1168.15 ml  Output --  Net 1168.15 ml   Filed Weights   12/06/21 2144  Weight: 71.2 kg    Exam:   General: Appearance:     Well developed, well nourished female wearing O2 with mild increased work of breathing     Lungs:    respirations unlabored  Heart:    Normal heart rate.   MS:   All extremities are intact.   Neurologic:   Awake, alert, oriented x 3. No apparent focal neurological           defect.       Data Reviewed:    Labs: Basic Metabolic Panel: Recent Labs  Lab 12/03/21 1525 12/04/21 0500 12/07/21 0335 12/08/21 0540  NA 134* 132* 137 140  K 4.3 4.9 4.3 4.3  CL 100 101 104 108  CO2 21* 20* 26 24  GLUCOSE 99 109* 147* 117*  BUN '11 12 13 20  '$ CREATININE 0.70 0.74 0.58 0.62  CALCIUM 10.0 9.5 8.9 8.9  MG  --   --  2.3  --    GFR Estimated Creatinine Clearance: 93.5 mL/min (by C-G formula based on SCr of 0.62 mg/dL). Liver Function Tests: Recent Labs  Lab 12/03/21 2346 12/04/21 0500 12/07/21 0335  AST 46* 45* 28  ALT '28 24 18  '$ ALKPHOS 107 95 93  BILITOT 0.5 0.7 0.4  PROT 7.4 6.5 6.8  ALBUMIN 3.3* 2.9* 2.8*   Recent Labs  Lab 12/03/21 2346  LIPASE 20   No results for input(s): "AMMONIA" in the last 168 hours. Coagulation profile Recent Labs  Lab 12/04/21 1603  INR 1.1   COVID-19 Labs  Recent Labs    12/06/21 0340  LDH 429*    Lab Results  Component Value Date   SARSCOV2NAA NEGATIVE 12/05/2021   [place] Not Detected 06/15/2019   [place] Not Detected 01/12/2019   SARSCOV2NAA NOT DETECTED 12/04/2018    CBC: Recent Labs  Lab 12/03/21 1525 12/04/21 0500 12/05/21 0450 12/07/21 0335 12/08/21 0540  WBC 11.9* 14.3* 10.7* 9.4 17.2*  NEUTROABS  --  11.0*  --  7.7  --   HGB 14.7 13.3 11.9* 11.5* 11.5*   HCT 43.4 38.8 34.4* 33.4* 33.6*  MCV 90.8 90.0 91.0 89.3 89.4  PLT 439* 365 335 351 448*   Cardiac Enzymes: No results for input(s): "CKTOTAL", "CKMB", "CKMBINDEX", "TROPONINI" in the last 168 hours. BNP (last 3 results) No results for input(s): "PROBNP" in the last 8760 hours. CBG: Recent Labs  Lab 12/08/21 0002  GLUCAP 126*   D-Dimer: No results for input(s): "DDIMER" in the last 72 hours. Hgb A1c: No results for input(s): "HGBA1C" in the last 72 hours. Lipid Profile: No results for input(s): "CHOL", "HDL", "LDLCALC", "TRIG", "CHOLHDL", "LDLDIRECT" in the last 72 hours. Thyroid function studies: No results for input(s): "TSH", "T4TOTAL", "T3FREE", "THYROIDAB" in the last 72 hours.  Invalid input(s): "FREET3" Anemia work up: No results for input(s): "VITAMINB12", "FOLATE", "FERRITIN", "TIBC", "IRON", "RETICCTPCT" in the last 72 hours. Sepsis Labs: Recent Labs  Lab 12/03/21 1513 12/03/21 1525 12/04/21 0500  12/05/21 0450 12/07/21 0335 12/07/21 0344 12/08/21 0540  PROCALCITON  --   --   --   --   --  0.10  --   WBC  --    < > 14.3* 10.7* 9.4  --  17.2*  LATICACIDVEN 1.7  --   --   --   --   --   --    < > = values in this interval not displayed.   Microbiology Recent Results (from the past 240 hour(s))  MRSA Next Gen by PCR, Nasal     Status: None   Collection Time: 12/04/21  2:14 PM   Specimen: Nasal Mucosa; Nasal Swab  Result Value Ref Range Status   MRSA by PCR Next Gen NOT DETECTED NOT DETECTED Final    Comment: (NOTE) The GeneXpert MRSA Assay (FDA approved for NASAL specimens only), is one component of a comprehensive MRSA colonization surveillance program. It is not intended to diagnose MRSA infection nor to guide or monitor treatment for MRSA infections. Test performance is not FDA approved in patients less than 29 years old. Performed at [facility], [place] [address], [place], [place] [address]   SARS Coronavirus 2 by RT PCR (hospital order,  performed in [facility] hospital lab) *cepheid single result test* Anterior Nasal Swab     Status: None   Collection Time: 12/05/21  7:26 AM   Specimen: Anterior Nasal Swab  Result Value Ref Range Status   SARS Coronavirus 2 by RT PCR NEGATIVE NEGATIVE Final    Comment: (NOTE) SARS-CoV-2 target nucleic acids are NOT DETECTED.  The SARS-CoV-2 RNA is generally detectable in upper and lower respiratory specimens during the acute phase of infection. The lowest concentration of SARS-CoV-2 viral copies this assay can detect is 250 copies / mL. A negative result does not preclude SARS-CoV-2 infection and should not be used as the sole basis for treatment or other patient management decisions.  A negative result may occur with improper specimen collection / handling, submission of specimen other than nasopharyngeal swab, presence of viral mutation(s) within the areas targeted by this assay, and inadequate number of viral copies (<250 copies / mL). A negative result must be combined with clinical observations, patient history, and epidemiological information.  Fact Sheet for Patients:   https://www.patel.info/  Fact Sheet for Healthcare Providers: https://hall.com/  This test is not yet approved or  cleared by the [place] FDA and has been authorized for detection and/or diagnosis of SARS-CoV-2 by FDA under an Emergency Use Authorization (EUA).  This EUA will remain in effect (meaning this test can be used) for the duration of the COVID-19 declaration under Section 564(b)(1) of the Act, 21 U.S.C. section 360bbb-3(b)(1), unless the authorization is terminated or revoked sooner.  Performed at [facility], [place] [address], [place], [place] [address]      Medications:    buPROPion  300 mg Oral Daily   Chlorhexidine Gluconate Cloth  6 each Topical Daily   citalopram  40 mg Oral Daily   dextromethorphan-guaiFENesin  1 tablet Oral BID    docusate sodium  100 mg Oral Daily   enoxaparin (LOVENOX) injection  40 mg Subcutaneous Q24H   feeding supplement  237 mL Oral BID BM   ipratropium-albuterol  3 mL Nebulization TID   melatonin  3 mg Oral QHS   traZODone  50 mg Oral QHS   Continuous Infusions:  sodium chloride Stopped (12/08/21 0351)      LOS: 4 days  Geradine Girt  [facility]  12/08/2021, 8:16 AM

## 2021-12-08 NOTE — Progress Notes (Signed)
NAME:  Melanie Ramirez, MRN:  [id number], DOB:  1981/04/17, LOS: 4 ADMISSION DATE:  12/03/2021, CONSULTATION DATE:  12/04/21 REFERRING MD:  Aileen Fass CHIEF COMPLAINT:  Dyspnea, cough   BRIEF  Melanie Ramirez is a 41 y.o. female who has a PMH as below. She presented to [facility] ED 6/14 with ongoing dyspnea and cough. Symptoms first started June 3 and she went to urgent care walk in clinic on 6/4 where CXR suggested infectious process / PNA. She was treated with IM Rocephin and PO Levaquin.  During that time frame she also experienced roughly 8 pound weight loss. She attributed it to decreased PO intake with the antibiotics. She has also been training for a half marathon, therefore, didn't think much of it.  In ED, she was hypoxic and was placed on venturi mask. 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 CTA chest was later obtained due to tachycardia, hypoxia, dyspnea and this was negative for PE.    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  Significant Hospital Events: Including procedures, antibiotic start and stop dates in addition to other pertinent events   6/13 admit. 6/14 pulm consult 6/15 CT-guided core biopsy of lung mass by IR.  MRSA PCR negative. 6/16 - Remains SOB. CXR stable. Anxiety after  talking to oncology precipitated some of her decline. Breathing treatments helping. Arranging for transfer to [place] today to start chemo.  -Oncology consultation differential diagnosis ovarian cancer [elevated CA125] over lung cancer with lymphangitic spread.  Carboplatin Taxol chemotherapy considered. 6/17 - 6/17 -now at [facility] long ICU.  On 14 L high flow nasal cannula.  Blood pressure fine.  Not on pressors.  Denies any pain. Afebrile. On Abx.  She has gotten some morphine for dyspnea and lorazepam for anxiety.  Thought to be anxious this morning by the hospitalist. On 12L [place]. Did not want morphine this AM for dyspnea.  STarting chemo today. Has PICC. On CAP antibiotics but no fever   Interim History / Subjective:   6/18 - down to nearly 8-10 L [place] this morning. Last night got resp distress and needed BIPAP. Sister Raquel [facility] CCM nurse) says BiPAP helped her and is supportoive of night time bipap. Currently patient sleeping. PCT < 0.,1  Objective:  Blood pressure 137/72, pulse 93, temperature 97.7 F (36.5 C), temperature source Oral, resp. rate (!) 23, height '5\' 5"'$  (1.651 m), weight 71.2 kg, last menstrual period 11/22/2021, SpO2 95 %, unknown if currently breastfeeding.    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  CTA chest 6/14 > negative for PE.  Assessment & Plan:   Acute hypoxic respiratory failure    Multiple pulmonary nodules/mediastinal lymphadenopathy with large ovarian mass and abdominal lymph nodes and abdominal lymph nodes-very suspicious for primary ovarian malignancy.    Acute on chronic anxiety  6/18 - down to 10LL [place]. Class 3-4 dyspnea. Clinical picuture not c/w pneumonia. Night BiPAP helped  Plan -BiPAP QHS - Continue nebs as scheduled - Wean O2 for sats >90% - morphine prn for dyspnea -per [facility] - await improivement with chemo - -intubate if worse - dc antibiotic per [facility] asap  PCCM will check every few days  D/w Dr Eliseo Squires of [facility]  ATTESTATION & SIGNATURE    Dr. Brand Males, M.D., [facility].C.P Pulmonary and Critical Care Medicine Medical Director - [facility] ICU Staff Physician, [place] [facility] Pager: [phone], If no answer or between  15:00h - 7:00h: call [phone]  12/08/2021 11:39 AM    LABS    PULMONARY Recent Labs  Lab 12/05/21 2005  HCO3 24.9  O2SAT 73.2    CBC Recent Labs  Lab 12/05/21 0450 12/07/21 0335 12/08/21 0540  HGB 11.9* 11.5* 11.5*  HCT 34.4* 33.4* 33.6*  WBC 10.7* 9.4 17.2*  PLT 335 351 448*    COAGULATION Recent Labs  Lab 12/04/21 1603  INR 1.1    CARDIAC  No results for input(s): "TROPONINI" in the last 168 hours. No results for input(s): "PROBNP" in the last 168 hours.   CHEMISTRY Recent Labs  Lab 12/03/21 1525 12/04/21 0500 12/07/21 0335 12/08/21 0540  NA 134* 132* 137 140  K 4.3 4.9 4.3 4.3  CL 100 101 104 108  CO2 21* 20* 26 24  GLUCOSE 99 109* 147* 117*  BUN '11 12 13 20  '$ CREATININE 0.70 0.74 0.58 0.62  CALCIUM 10.0 9.5 8.9 8.9  MG  --   --  2.3  --    Estimated Creatinine Clearance: 93.5 mL/min (by C-G formula based on SCr of 0.62 mg/dL).   LIVER Recent Labs  Lab 12/03/21 2346 12/04/21 0500 12/04/21 1603 12/07/21 0335  AST 46* 45*  --  28  ALT 28 24  --  18  ALKPHOS 107 95  --  93  BILITOT 0.5 0.7  --  0.4  PROT 7.4  6.5  --  6.8  ALBUMIN 3.3* 2.9*  --  2.8*  INR  --   --  1.1  --      INFECTIOUS Recent Labs  Lab 12/03/21 1513 12/07/21 0344  LATICACIDVEN 1.7  --   PROCALCITON  --  0.10     ENDOCRINE CBG (last 3)  Recent Labs    12/08/21 0002  GLUCAP 126*         IMAGING x48h  - image(s) personally visualized  -   highlighted in bold No results found.

## 2021-12-08 NOTE — Progress Notes (Signed)
Initial Nutrition Assessment RD working remotely.  DOCUMENTATION CODES:   Not applicable  INTERVENTION:  - continue Ensure Plus High Protein BID, each supplement provides 350 kcal and 20 grams of protein.  - will order snacks daily at 10 AM and 8 PM.  - complete NFPE when feasible.   NUTRITION DIAGNOSIS:   Increased nutrient needs related to cancer and cancer related treatments as evidenced by estimated needs.  GOAL:   Patient will meet greater than or equal to 90% of their needs  MONITOR:   PO intake, Supplement acceptance, Labs, Weight trends  REASON FOR ASSESSMENT:   Diagnosis  ASSESSMENT:   41 y.o. female with medical history of depression and anxiety. She presented to the ED due to shortness of breath and non-productive cough. At [facility] walk-in clinic she had CXR done which showed diffuse bilateral pulmonary infiltrates. 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 CT abdomen/pelvis showed a solid cystic mass which has replaced the left ovary measuring 7 x 11 x 9 and multiple enlarged lymph nodes within the gastropathic ligament consistent with metastatic disease. She was admitted due to concern for metastatic ovarian cancer and transferred to [facility] for initiation of chemo.  Flow sheet documentation indicates patient ate 50% of lunch on 6/15; 50% of breakfast and 25% of lunch on 6/16. No other meal intakes documented. Breakfast was ordered and delivered this AM.  Ensure was ordered BID yesterday and patient accepted both bottles offered.   She has not been seen by a [place] RD at any time in the past.   Weight on 6/16 was 157 lb and PTA the most recently documented weight was 159 lb on 12/03/2018. No information documented in the edema section of flow sheet.  Will  order snacks daily at 1000 (fresh fruit cup, vanilla greek yogurt, hard boiled egg, whole milk) and 2000 (PB&J, bottled water, and lemon-lime soda).  Per notes: - possible post-obstructive PNA likely d/t metastatic ovarian cancer - s/p lung biopsy, pending results - PICC placed 6/14 in anticipation of inpatient chemo start--started on 6/17   Labs reviewed; CBG: 126 mg/dl. Medications reviewed; 100 mg colace/day, 3 mg melatonin/night.    NUTRITION - FOCUSED PHYSICAL EXAM:  RD working remotely.  Diet Order:   Diet Order             Diet regular Room service appropriate? Yes; Fluid consistency: Thin  Diet effective now                   EDUCATION NEEDS:   No education needs have been identified at this time  Skin:  Skin Assessment: Reviewed RN Assessment  Last BM:  6/17 (type 5 x1 and type 6 x1, medium amount)  Height:   Ht Readings from Last 1 Encounters:  12/06/21 '5\' 5"'$  (1.651 m)    Weight:   Wt Readings from Last 1 Encounters:  12/06/21 71.2 kg     BMI:  Body mass index is 26.12 kg/m.  Estimated Nutritional Needs:  Kcal:  2150-2400 kcal Protein:  105-120 grams Fluid:  >/= 2.2 L/day      Jarome Matin, MS, RD, LDN, CNSC Registered Dietitian II Inpatient Clinical Nutrition RD pager # and on-call/weekend pager # available in [facility]

## 2021-12-08 NOTE — Progress Notes (Signed)
Melanie Ramirez   DOB:[birth year]   XF#:[id number]    ASSESSMENT & PLAN:  Advanced malignancy with bulky disease in the lung and lymphadenopathy, nodules, and lymphangitic spread, suspected ovarian cancer Imaging results have been discussed with the patient and findings concerning for advanced malignancy, likely GYN/ovarian primary CA125 is elevated at 296 Preliminary results confirmed malignancy by IHC stains pending She tolerated cycle 1 day 1 treatment on December 07, 6965 without complications Continue supportive care  Acute hypoxic respiratory failure Due to bulky disease in the lung and possible lymphangitic spread She is doing a bit better without nonrebreather but on high flow oxygen She tolerated BiPAP overnight well Currently on respiratory isolation pending further work-up by pulmonologist I would defer to the team for further evaluation and management   Mild normocytic anemia Noted to have mild normocytic anemia Monitor   Elevated uric acid level, at risk for tumor lysis I recommend 1 dose of rasburicase today The patient is hydrating adequately by mouth  Goals of care She has completed cycle 1 day 1 of treatment on June 17 Continue supportive care with goal to resolve respiratory failure prior to discharge   Discharge planning Not safe for discharge until she can be discharged on 2 Liters of oxygen  All questions were answered. The patient knows to call the clinic with any problems, questions or concerns.   The total time spent in the appointment was 30 minutes encounter with patients including review of chart and various tests results, discussions about plan of care and coordination of care plan  Heath Lark, MD 12/08/2021 8:57 AM  Subjective:  She felt stable this morning.  She required BiPAP overnight No fever or chills Had 1 episode of desaturation due to mucous plugging She is drinking lots of fluids.  Denies nausea or constipation  Objective:  Vitals:    12/08/21 0700 12/08/21 0800  BP:  105/79  Pulse: 84 85  Resp:  20  Temp:  97.7 F (36.5 C)  SpO2: 95% 97%     Intake/Output Summary (Last 24 hours) at 12/08/2021 0857 Last data filed at 12/08/2021 0428 Gross per 24 hour  Intake 1168.15 ml  Output --  Net 1168.15 ml    GENERAL:alert, no distress and comfortable  NEURO: alert & oriented x 3 with fluent speech, no focal motor/sensory deficits   Labs:  Recent Labs    12/03/21 2346 12/04/21 0500 12/07/21 0335 12/08/21 0540  NA  --  132* 137 140  K  --  4.9 4.3 4.3  CL  --  101 104 108  CO2  --  20* 26 24  GLUCOSE  --  109* 147* 117*  BUN  --  '12 13 20  '$ CREATININE  --  0.74 0.58 0.62  CALCIUM  --  9.5 8.9 8.9  GFRNONAA  --  >60 >60 >60  PROT 7.4 6.5 6.8  --   ALBUMIN 3.3* 2.9* 2.8*  --   AST 46* 45* 28  --   ALT '28 24 18  '$ --   ALKPHOS 107 95 93  --   BILITOT 0.5 0.7 0.4  --   BILIDIR <0.1  --   --   --   IBILI NOT CALCULATED  --   --   --     Studies:  DG Chest Port 1 View  Result Date: 12/06/2021 CLINICAL DATA:  Provided history: Acute respiratory failure with hypoxia. Shortness of breath. EXAM: PORTABLE CHEST 1 VIEW COMPARISON:  Chest radiograph 12/05/2021 and earlier.  Chest CT 12/04/2021. FINDINGS: Unchanged position of a right-sided PICC with tip projecting at the level of the superior cavoatrial junction. The cardiomediastinal silhouette is unchanged. Known mediastinal and hilar lymphadenopathy. Again demonstrated are extensive mid to lower lung zone predominant opacities compatible with pulmonary metastatic disease. Superimposed pneumonia cannot be excluded. Findings are similar to the prior chest radiograph of 12/05/2021. A portion of the left lateral costophrenic angle is excluded from the field of view. Small bilateral pleural effusions. No evidence of pneumothorax. IMPRESSION: No significant change from the prior chest radiograph 12/05/2021. Redemonstrated extensive mid-to-lower lung zone predominant opacities  compatible with pulmonary metastatic disease. Superimposed pneumonia cannot be excluded. Small bilateral pleural effusions. Electronically Signed   By: Kellie Simmering D.O.   On: 12/06/2021 09:14   CT BIOPSY  Result Date: 12/05/2021 INDICATION: Multiple lung masses EXAM: CT-guided biopsy of right lower lung mass MEDICATIONS: Per EMR ANESTHESIA/SEDATION: Local analgesia FLUOROSCOPY TIME:  N/a COMPLICATIONS: None immediate. PROCEDURE: Informed written consent was obtained from the patient after a thorough discussion of the procedural risks, benefits and alternatives. All questions were addressed. Maximal Sterile Barrier Technique was utilized including caps, mask, sterile gowns, sterile gloves, sterile drape, hand hygiene and skin antiseptic. A timeout was performed prior to the initiation of the procedure. The patient was placed in the lateral decubitus position with right side down on the CT exam table. Limited CT of the chest was performed for planning purposes. This again demonstrated innumerable bilateral pulmonary masses. Inappropriate right lower lung subpleural nodule/mass was selected as appropriate for percutaneous biopsy. Skin entry site was marked, and the overlying skin was prepped and draped in the standard sterile fashion. Local analgesia was obtained with 1% lidocaine. Using intermittent CT fluoroscopy, a 17 gauge introducer needle was advanced towards the target subpleural nodule in the right lower lobe. Subsequently, core needle biopsy was performed using an 18 gauge core biopsy device x4 total passes. Grossly adequate appearing specimens were submitted in formalin to pathology for further handling. Needle was removed and an occlusive dressing was placed. Postprocedure imaging demonstrated small volume bleeding surrounding the biopsied mass within expected limits and found to be stable over a short monitoring interval. Patient overall tolerated the procedure well, and was transferred to recovery in  stable condition. IMPRESSION: Successful CT-guided core needle biopsy of right lower lung pulmonary nodule/mass. Electronically Signed   By: Albin Felling M.D.   On: 12/05/2021 15:02   DG Chest Port 1 View  Result Date: 12/05/2021 CLINICAL DATA:  Right lung biopsy EXAM: PORTABLE CHEST 1 VIEW COMPARISON:  11/26/2021 FINDINGS: Right-sided PICC line terminates at the level of the superior cavoatrial junction. Stable heart size. Low lung volumes. Extensive bilateral airspace opacities compatible with diffuse pulmonary metastatic disease. Prominence of the mediastinal and hilar borders compatible with underlying lymphadenopathy. Low lung volumes. No pneumothorax identified. Portion of the lung apices obscured by patient's chin. IMPRESSION: 1. No pneumothorax identified following right lung biopsy. 2. Right-sided PICC line terminates at the level of the superior cavoatrial junction. 3. Extensive bilateral airspace opacities compatible with diffuse pulmonary metastatic disease. Electronically Signed   By: Davina Poke D.O.   On: 12/05/2021 11:29   [place] EKG SITE RITE  Result Date: 12/04/2021 If Site Rite image not attached, placement could not be confirmed due to current cardiac rhythm.  CT Angio Chest Pulmonary Embolism (PE) W or WO Contrast  Result Date: 12/04/2021 CLINICAL DATA:  Chest pain or SOB, pleurisy or effusion suspected EXAM: CT ANGIOGRAPHY CHEST WITH CONTRAST  TECHNIQUE: Multidetector CT imaging of the chest was performed using the standard protocol during bolus administration of intravenous contrast. Multiplanar CT image reconstructions and MIPs were obtained to evaluate the vascular anatomy. RADIATION DOSE REDUCTION: This exam was performed according to the departmental dose-optimization program which includes automated exposure control, adjustment of the mA and/or kV according to patient size and/or use of iterative reconstruction technique. CONTRAST:  85m OMNIPAQUE IOHEXOL 350 MG/ML SOLN  COMPARISON:  12/03/2021 FINDINGS: Cardiovascular: SVC is patent. Heart size normal. No pericardial effusion. Satisfactory opacification of pulmonary arteries noted, and there is no evidence of pulmonary emboli. There is encasement and narrowing of right upper lobe pulmonary artery branch by tumor. Adequate contrast opacification of the thoracic aorta with no evidence of dissection, aneurysm, or stenosis. There is classic 3-vessel brachiocephalic arch anatomy without proximal stenosis. Mediastinum/Nodes: Bulky mediastinal and bilateral hilar adenopathy as seen on previous day's exam Lungs/Pleura: Innumerable pulmonary nodules bilaterally as before. Progressive airspace consolidation at the right lung base base. Trace left pleural effusion as before. No pneumothorax. Upper Abdomen: Stable gastrohepatic lymphadenopathy. No acute findings. Musculoskeletal: No chest wall abnormality. No acute or significant osseous findings. Review of the MIP images confirms the above findings. IMPRESSION: 1. Negative for acute PE or thoracic aortic dissection. 2. Encasement and narrowing of the right upper lobe pulmonary artery by tumor. 3. Stable bilateral pulmonary nodules, hilar and mediastinal adenopathy. 4. Progressive consolidation at the right lung base Electronically Signed   By: DLucrezia EuropeM.D.   On: 12/04/2021 10:42   CT ABDOMEN PELVIS W CONTRAST  Result Date: 12/04/2021 CLINICAL DATA:  Weight loss, unintended masses on lung CT, weight loss, lymph involvement. Pulmonary metastatic disease of unknown primary. EXAM: CT ABDOMEN AND PELVIS WITH CONTRAST TECHNIQUE: Multidetector CT imaging of the abdomen and pelvis was performed using the standard protocol following bolus administration of intravenous contrast. RADIATION DOSE REDUCTION: This exam was performed according to the departmental dose-optimization program which includes automated exposure control, adjustment of the mA and/or kV according to patient size and/or use of  iterative reconstruction technique. CONTRAST:  872mOMNIPAQUE IOHEXOL 300 MG/ML  SOLN COMPARISON:  None. Findings are correlated with CT examination of the chest of 12/03/2021. FINDINGS: Lower chest: Innumerable pulmonary masses are again identified within the visualized lung bases bilaterally, better evaluated on previously performed dedicated CT imaging of the chest. Bulky bilateral pleural metastatic disease noted. Small left pleural effusion again seen. Pericardial lymphadenopathy noted. Hepatobiliary: No enhancing intrahepatic mass. No intra or extrahepatic biliary ductal dilation. Cholelithiasis without pericholecystic inflammatory change noted. Pancreas: Unremarkable Spleen: Unremarkable Adrenals/Urinary Tract: Adrenal glands are unremarkable. Kidneys are normal, without renal calculi, focal lesion, or hydronephrosis. Bladder is unremarkable. Stomach/Bowel: Stomach is within normal limits. Appendix appears normal. No evidence of bowel wall thickening, distention, or inflammatory changes. Vascular/Lymphatic: Multiple pathologically enlarged lymph nodes are seen within the gastrohepatic ligament measuring up to 18 mm in short axis diameter at axial image # 23/3. The abdominal vasculature is unremarkable. Reproductive: A mixed solid and cystic mass replaces the left ovary measuring roughly 6.8 x 11.0 x 9.2 cm in greatest dimension. This may represent a primary ovarian mass or an ovarian metastasis. Uterus and right ovary are unremarkable. Other: No abdominal wall hernia. Musculoskeletal: No acute bone abnormality. No lytic or blastic bone lesion. IMPRESSION: 1. A mixed solid and cystic mass replaces the left ovary measuring roughly 6.8 x 11.0 x 9.2 cm in greatest dimension. This may represent a primary ovarian mass or an ovarian metastasis. 2.  Multiple pathologically enlarged lymph nodes within the gastrohepatic ligament, consistent with metastatic disease. 3. Innumerable pulmonary masses within the visualized  lung bases bilaterally, better evaluated on previously performed dedicated CT imaging of the chest. 4. Small left pleural effusion. 5. Cholelithiasis without pericholecystic inflammatory change. Electronically Signed   By: Fidela Salisbury M.D.   On: 12/04/2021 01:49   CT Chest Wo Contrast  Result Date: 12/03/2021 CLINICAL DATA:  Pneumonia suspected; * Tracking Code: BO * EXAM: CT CHEST WITHOUT CONTRAST TECHNIQUE: Multidetector CT imaging of the chest was performed following the standard protocol without IV contrast. RADIATION DOSE REDUCTION: This exam was performed according to the departmental dose-optimization program which includes automated exposure control, adjustment of the mA and/or kV according to patient size and/or use of iterative reconstruction technique. COMPARISON:  Radiograph dated November 26, 2021 FINDINGS: Cardiovascular: Normal heart size. Trace pericardial fluid. No coronary artery calcifications. Normal caliber thoracic aorta with no atherosclerotic disease. Mediastinum/Nodes: Esophagus and thyroid are unremarkable. Bulky enlarged mediastinal and bilateral hilar lymph nodes. Reference right upper paratracheal lymph node measuring 2.9 cm in short axis on series 3, image 19. Reference AP window lymph node measuring 2.7 cm in short axis on image 25. Reference right hilar lymph node measuring 2.5 cm in short axis on series 3, image 29. Reference left hilar lymph node measuring 2.4 cm in short axis on image 77. Lungs/Pleura: Central airways are patent. Innumerable bilateral solid pulmonary nodules which are most prominent in the lower lungs reference right middle lobe pulmonary nodule measuring 2.5 x 1.9 cm on series 4, image 92. Reference left lower lobe pulmonary nodule measuring 2.1 x 1.6 cm on image 113. Perifissural/subpleural nodularity and interlobular septal thickening. Small right pleural effusion. Upper Abdomen: Enlarged upper abdominal lymph nodes. Reference gastrohepatic ligament lymph node  measuring 1.5 cm in short axis on series 2, image 60. Musculoskeletal: No chest wall mass or suspicious bone lesions identified. IMPRESSION: 1. Innumerable bilateral solid pulmonary nodules which are most prominent in the lower lungs, findings are concerning for metastatic disease. 2. 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 3. Bulky mediastinal bilateral hilar lymph nodes, likely due to nodal metastatic disease. 4. Enlarged lymph nodes of the upper abdomen, concerning for additional site of nodal disease. 5. Small left pleural effusion. Electronically Signed   By: Yetta Glassman M.D.   On: 12/03/2021 16:46

## 2021-12-09 DIAGNOSIS — R918 Other nonspecific abnormal finding of lung field: Secondary | ICD-10-CM | POA: Diagnosis not present

## 2021-12-09 DIAGNOSIS — J9601 Acute respiratory failure with hypoxia: Secondary | ICD-10-CM | POA: Diagnosis not present

## 2021-12-09 LAB — BASIC METABOLIC PANEL
Anion gap: 8 (ref 5–15)
BUN: 24 mg/dL — ABNORMAL HIGH (ref 6–20)
CO2: 25 mmol/L (ref 22–32)
Calcium: 8.6 mg/dL — ABNORMAL LOW (ref 8.9–10.3)
Chloride: 105 mmol/L (ref 98–111)
Creatinine, Ser: 0.57 mg/dL (ref 0.44–1.00)
GFR, Estimated: >60 mL/min (ref >60)
Glucose, Bld: 92 mg/dL (ref 70–99)
Potassium: 4.3 mmol/L (ref 3.5–5.1)
Sodium: 138 mmol/L (ref 135–145)

## 2021-12-09 LAB — CBC
HCT: 32.8 % — ABNORMAL LOW (ref 36.0–46.0)
Hemoglobin: 11.3 g/dL — ABNORMAL LOW (ref 12.0–15.0)
MCH: 30.8 pg (ref 26.0–34.0)
MCHC: 34.5 g/dL (ref 30.0–36.0)
MCV: 89.4 fL (ref 80.0–100.0)
Platelets: 356 10*3/uL (ref 150–400)
RBC: 3.67 MIL/uL — ABNORMAL LOW (ref 3.87–5.11)
RDW: 11.8 % (ref 11.5–15.5)
WBC: 10.2 10*3/uL (ref 4.0–10.5)
nRBC: 0 % (ref 0.0–0.2)

## 2021-12-09 LAB — URIC ACID: Uric Acid, Serum: 3.8 mg/dL (ref 2.5–7.1)

## 2021-12-09 MED ORDER — LEVALBUTEROL HCL 0.63 MG/3ML IN NEBU
0.6300 mg | INHALATION_SOLUTION | Freq: Three times a day (TID) | RESPIRATORY_TRACT | Status: DC
  Administered 2021-12-09 – 2021-12-14 (×15): 0.63 mg via RESPIRATORY_TRACT
  Filled 2021-12-09 (×15): qty 3

## 2021-12-09 MED ORDER — GUAIFENESIN 100 MG/5ML PO LIQD
5.0000 mL | ORAL | Status: DC | PRN
Start: 2021-12-09 — End: 2021-12-14
  Administered 2021-12-09 – 2021-12-14 (×8): 5 mL via ORAL
  Filled 2021-12-09 (×8): qty 10

## 2021-12-09 MED ORDER — LEVALBUTEROL HCL 0.63 MG/3ML IN NEBU: 0.6300 mg | INHALATION_SOLUTION | RESPIRATORY_TRACT | Status: DC | PRN

## 2021-12-09 MED ORDER — IPRATROPIUM BROMIDE 0.02 % IN SOLN
0.5000 mg | Freq: Three times a day (TID) | RESPIRATORY_TRACT | Status: DC
  Administered 2021-12-09 – 2021-12-14 (×15): 0.5 mg via RESPIRATORY_TRACT
  Filled 2021-12-09 (×15): qty 2.5

## 2021-12-09 NOTE — Progress Notes (Addendum)
NAME:  Melanie Ramirez, MRN:  [id number], DOB:  01-20-1981, LOS: 5 ADMISSION DATE:  12/03/2021, CONSULTATION DATE:  12/04/21 REFERRING MD:  Aileen Fass CHIEF COMPLAINT:  Dyspnea, cough   BRIEF  Melanie Ramirez is a 41 y.o. female who has a PMH as below. She presented to [facility] ED 6/14 with ongoing dyspnea and cough. Symptoms first started June 3 and she went to urgent care walk in clinic on 6/4 where CXR suggested infectious process / PNA. She was treated with IM Rocephin and PO Levaquin.  During that time frame she also experienced roughly 8 pound weight loss. She attributed it to decreased PO intake with the antibiotics. She has also been training for a half marathon, therefore, didn't think much of it.  In ED, she was hypoxic and was placed on venturi mask. 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 CTA chest was later obtained due to tachycardia, hypoxia, dyspnea and this was negative for PE.    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  Significant Hospital Events: Including procedures, antibiotic start and stop dates in addition to other pertinent events   6/13 admit. 6/14 pulm consult 6/15 CT-guided core biopsy of lung mass by IR.  MRSA PCR negative. 6/16 - Remains SOB. CXR stable. Anxiety after  talking to oncology precipitated some of her decline. Breathing treatments helping. Arranging for transfer to [place] today to start chemo.  -Oncology consultation differential diagnosis ovarian cancer [elevated CA125] over lung cancer with lymphangitic spread.  Carboplatin Taxol chemotherapy considered. 6/17 - 6/17 -now at [facility] long ICU.  On 14 L high flow nasal cannula.  Blood pressure fine.  Not on pressors.  Denies any pain. Afebrile. On Abx.  She has gotten some morphine for dyspnea and lorazepam for anxiety.  Thought to be anxious this morning by the hospitalist. On 12L [place]. Did not want morphine this AM for dyspnea.  STarting chemo today. Has PICC. On CAP antibiotics but no fever   Interim History / Subjective:   No complaints on 9L [place] this morning. Sats in the mid 90s. Turned O2 down to 8L.   Objective:  Blood pressure 132/72, pulse 95, temperature 97.7 F (36.5 C), temperature source Oral, resp. rate (!) 29, height '5\' 5"'$  (1.651 m), weight 71.2 kg, last menstrual period 11/22/2021, SpO2 93 %, unknown if currently breastfeeding.    FiO2 (%):  [50 %] 50 %   Intake/Output Summary (Last 24 hours) at 12/09/2021 0906 Last data filed at 12/09/2021 0600 Gross per 24 hour  Intake 63.57 ml  Output --  Net 63.57 ml    Filed Weights   12/06/21 2144  Weight: 71.2 kg    Examination: General:  adult female of normal body habitus Neuro:  Alert, oriented, non-focal.  HEENT:  [place]/AT, No JVD noted, PERRL Cardiovascular:  RRR, no MRG Lungs:  Clear bilateral breath sounds. 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  CTA chest 6/14 > negative for  PE.  Assessment & Plan:   Acute hypoxic respiratory failure: secondary to multiple pulmonary metastases primary likely ovarian per oncology.   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 Please call Elink 7p-7a. [phone]  12/09/2021 9:30 AM

## 2021-12-09 NOTE — Progress Notes (Signed)
RT placed pt on BIPAP for the night w/settings of 10/5 BUR 10 50%. Pt resting comfortably at this time. RT will continue to monitor.

## 2021-12-09 NOTE — Progress Notes (Signed)
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 Imaging results have been discussed with the patient and findings concerning for advanced malignancy, likely GYN/ovarian primary CA125 is elevated at 296 Preliminary results confirmed malignancy by IHC stains pending, will call pathologist again today to see if final results are available She tolerated cycle 1 day 1 treatment on December 07, 5364 without complications Continue supportive care   Acute hypoxic respiratory failure Due to bulky disease in the lung and possible lymphangitic spread She is doing a bit better without nonrebreather but on high flow oxygen She tolerated BiPAP overnight well Currently on respiratory isolation pending further work-up by pulmonologist I would defer to the team for further evaluation and management   Mild normocytic anemia Noted to have mild normocytic anemia Monitor   Elevated uric acid level, at risk for tumor lysis, resolving She received 1 dose of rasburicase on 12/08/2021 The patient is hydrating adequately by mouth   Goals of care She has completed cycle 1 day 1 of treatment on June 17 Continue supportive care with goal to resolve respiratory failure prior to discharge   Discharge planning Not safe for discharge until she can be discharged on 2 Liters of oxygen  All questions were answered. The patient knows to call the clinic with any problems, questions or concerns.   The total time spent in the appointment was 25 minutes encounter with patients including review of chart and various tests results, discussions about plan of care and coordination of care plan  Heath Lark, MD 12/09/2021 7:56 AM  Subjective:  She had no new symptoms since I saw her.  Tolerated BiPAP well overnight  Objective:  Vitals:   12/09/21 0600 12/09/21 0700  BP:  118/60 132/72  Pulse: (!) 102 95  Resp: (!) 25 (!) 29  Temp: 98.3 F (36.8 C)   SpO2: 95% 93%     Intake/Output Summary (Last 24 hours) at 12/09/2021 0756 Last data filed at 12/09/2021 0600 Gross per 24 hour  Intake 63.57 ml  Output --  Net 63.57 ml    GENERAL:alert, no distress and comfortable.  She appears to be breathing comfortably on the 20 breaths/min on high flow oxygen NEURO: alert & oriented x 3 with fluent speech, no focal motor/sensory deficits   Labs:  Recent Labs    12/03/21 2346 12/04/21 0500 12/07/21 0335 12/08/21 0540 12/09/21 0550  NA  --  132* 137 140 138  K  --  4.9 4.3 4.3 4.3  CL  --  101 104 108 105  CO2  --  20* '26 24 25  '$ GLUCOSE  --  109* 147* 117* 92  BUN  --  '12 13 20 '$ 24*  CREATININE  --  0.74 0.58 0.62 0.57  CALCIUM  --  9.5 8.9 8.9 8.6*  GFRNONAA  --  >60 >60 >60 >60  PROT 7.4 6.5 6.8  --   --   ALBUMIN 3.3* 2.9* 2.8*  --   --   AST 46* 45* 28  --   --   ALT '28 24 18  '$ --   --   ALKPHOS 107 95 93  --   --   BILITOT 0.5 0.7 0.4  --   --   BILIDIR <0.1  --   --   --   --   IBILI NOT CALCULATED  --   --   --   --  Studies:  DG Chest Port 1 View  Result Date: 12/06/2021 CLINICAL DATA:  Provided history: Acute respiratory failure with hypoxia. Shortness of breath. EXAM: PORTABLE CHEST 1 VIEW COMPARISON:  Chest radiograph 12/05/2021 and earlier. Chest CT 12/04/2021. FINDINGS: Unchanged position of a right-sided PICC with tip projecting at the level of the superior cavoatrial junction. The cardiomediastinal silhouette is unchanged. Known mediastinal and hilar lymphadenopathy. Again demonstrated are extensive mid to lower lung zone predominant opacities compatible with pulmonary metastatic disease. Superimposed pneumonia cannot be excluded. Findings are similar to the prior chest radiograph of 12/05/2021. A portion of the left lateral costophrenic angle is excluded from the field of view. Small bilateral pleural effusions. No evidence of  pneumothorax. IMPRESSION: No significant change from the prior chest radiograph 12/05/2021. Redemonstrated extensive mid-to-lower lung zone predominant opacities compatible with pulmonary metastatic disease. Superimposed pneumonia cannot be excluded. Small bilateral pleural effusions. Electronically Signed   By: Kellie Simmering D.O.   On: 12/06/2021 09:14   CT BIOPSY  Result Date: 12/05/2021 INDICATION: Multiple lung masses EXAM: CT-guided biopsy of right lower lung mass MEDICATIONS: Per EMR ANESTHESIA/SEDATION: Local analgesia FLUOROSCOPY TIME:  N/a COMPLICATIONS: None immediate. PROCEDURE: Informed written consent was obtained from the patient after a thorough discussion of the procedural risks, benefits and alternatives. All questions were addressed. Maximal Sterile Barrier Technique was utilized including caps, mask, sterile gowns, sterile gloves, sterile drape, hand hygiene and skin antiseptic. A timeout was performed prior to the initiation of the procedure. The patient was placed in the lateral decubitus position with right side down on the CT exam table. Limited CT of the chest was performed for planning purposes. This again demonstrated innumerable bilateral pulmonary masses. Inappropriate right lower lung subpleural nodule/mass was selected as appropriate for percutaneous biopsy. Skin entry site was marked, and the overlying skin was prepped and draped in the standard sterile fashion. Local analgesia was obtained with 1% lidocaine. Using intermittent CT fluoroscopy, a 17 gauge introducer needle was advanced towards the target subpleural nodule in the right lower lobe. Subsequently, core needle biopsy was performed using an 18 gauge core biopsy device x4 total passes. Grossly adequate appearing specimens were submitted in formalin to pathology for further handling. Needle was removed and an occlusive dressing was placed. Postprocedure imaging demonstrated small volume bleeding surrounding the biopsied mass  within expected limits and found to be stable over a short monitoring interval. Patient overall tolerated the procedure well, and was transferred to recovery in stable condition. IMPRESSION: Successful CT-guided core needle biopsy of right lower lung pulmonary nodule/mass. Electronically Signed   By: Albin Felling M.D.   On: 12/05/2021 15:02   DG Chest Port 1 View  Result Date: 12/05/2021 CLINICAL DATA:  Right lung biopsy EXAM: PORTABLE CHEST 1 VIEW COMPARISON:  11/26/2021 FINDINGS: Right-sided PICC line terminates at the level of the superior cavoatrial junction. Stable heart size. Low lung volumes. Extensive bilateral airspace opacities compatible with diffuse pulmonary metastatic disease. Prominence of the mediastinal and hilar borders compatible with underlying lymphadenopathy. Low lung volumes. No pneumothorax identified. Portion of the lung apices obscured by patient's chin. IMPRESSION: 1. No pneumothorax identified following right lung biopsy. 2. Right-sided PICC line terminates at the level of the superior cavoatrial junction. 3. Extensive bilateral airspace opacities compatible with diffuse pulmonary metastatic disease. Electronically Signed   By: Davina Poke D.O.   On: 12/05/2021 11:29   [place] EKG SITE RITE  Result Date: 12/04/2021 If Site Rite image not attached, placement could not be confirmed due  to current cardiac rhythm.  CT Angio Chest Pulmonary Embolism (PE) W or WO Contrast  Result Date: 12/04/2021 CLINICAL DATA:  Chest pain or SOB, pleurisy or effusion suspected EXAM: CT ANGIOGRAPHY CHEST WITH CONTRAST TECHNIQUE: Multidetector CT imaging of the chest was performed using the standard protocol during bolus administration of intravenous contrast. Multiplanar CT image reconstructions and MIPs were obtained to evaluate the vascular anatomy. RADIATION DOSE REDUCTION: This exam was performed according to the departmental dose-optimization program which includes automated exposure control,  adjustment of the mA and/or kV according to patient size and/or use of iterative reconstruction technique. CONTRAST:  20m OMNIPAQUE IOHEXOL 350 MG/ML SOLN COMPARISON:  12/03/2021 FINDINGS: Cardiovascular: SVC is patent. Heart size normal. No pericardial effusion. Satisfactory opacification of pulmonary arteries noted, and there is no evidence of pulmonary emboli. There is encasement and narrowing of right upper lobe pulmonary artery branch by tumor. Adequate contrast opacification of the thoracic aorta with no evidence of dissection, aneurysm, or stenosis. There is classic 3-vessel brachiocephalic arch anatomy without proximal stenosis. Mediastinum/Nodes: Bulky mediastinal and bilateral hilar adenopathy as seen on previous day's exam Lungs/Pleura: Innumerable pulmonary nodules bilaterally as before. Progressive airspace consolidation at the right lung base base. Trace left pleural effusion as before. No pneumothorax. Upper Abdomen: Stable gastrohepatic lymphadenopathy. No acute findings. Musculoskeletal: No chest wall abnormality. No acute or significant osseous findings. Review of the MIP images confirms the above findings. IMPRESSION: 1. Negative for acute PE or thoracic aortic dissection. 2. Encasement and narrowing of the right upper lobe pulmonary artery by tumor. 3. Stable bilateral pulmonary nodules, hilar and mediastinal adenopathy. 4. Progressive consolidation at the right lung base Electronically Signed   By: DLucrezia EuropeM.D.   On: 12/04/2021 10:42   CT ABDOMEN PELVIS W CONTRAST  Result Date: 12/04/2021 CLINICAL DATA:  Weight loss, unintended masses on lung CT, weight loss, lymph involvement. Pulmonary metastatic disease of unknown primary. EXAM: CT ABDOMEN AND PELVIS WITH CONTRAST TECHNIQUE: Multidetector CT imaging of the abdomen and pelvis was performed using the standard protocol following bolus administration of intravenous contrast. RADIATION DOSE REDUCTION: This exam was performed according to  the departmental dose-optimization program which includes automated exposure control, adjustment of the mA and/or kV according to patient size and/or use of iterative reconstruction technique. CONTRAST:  829mOMNIPAQUE IOHEXOL 300 MG/ML  SOLN COMPARISON:  None. Findings are correlated with CT examination of the chest of 12/03/2021. FINDINGS: Lower chest: Innumerable pulmonary masses are again identified within the visualized lung bases bilaterally, better evaluated on previously performed dedicated CT imaging of the chest. Bulky bilateral pleural metastatic disease noted. Small left pleural effusion again seen. Pericardial lymphadenopathy noted. Hepatobiliary: No enhancing intrahepatic mass. No intra or extrahepatic biliary ductal dilation. Cholelithiasis without pericholecystic inflammatory change noted. Pancreas: Unremarkable Spleen: Unremarkable Adrenals/Urinary Tract: Adrenal glands are unremarkable. Kidneys are normal, without renal calculi, focal lesion, or hydronephrosis. Bladder is unremarkable. Stomach/Bowel: Stomach is within normal limits. Appendix appears normal. No evidence of bowel wall thickening, distention, or inflammatory changes. Vascular/Lymphatic: Multiple pathologically enlarged lymph nodes are seen within the gastrohepatic ligament measuring up to 18 mm in short axis diameter at axial image # 23/3. The abdominal vasculature is unremarkable. Reproductive: A mixed solid and cystic mass replaces the left ovary measuring roughly 6.8 x 11.0 x 9.2 cm in greatest dimension. This may represent a primary ovarian mass or an ovarian metastasis. Uterus and right ovary are unremarkable. Other: No abdominal wall hernia. Musculoskeletal: No acute bone abnormality. No lytic or blastic bone  lesion. IMPRESSION: 1. A mixed solid and cystic mass replaces the left ovary measuring roughly 6.8 x 11.0 x 9.2 cm in greatest dimension. This may represent a primary ovarian mass or an ovarian metastasis. 2. Multiple  pathologically enlarged lymph nodes within the gastrohepatic ligament, consistent with metastatic disease. 3. Innumerable pulmonary masses within the visualized lung bases bilaterally, better evaluated on previously performed dedicated CT imaging of the chest. 4. Small left pleural effusion. 5. Cholelithiasis without pericholecystic inflammatory change. Electronically Signed   By: Fidela Salisbury M.D.   On: 12/04/2021 01:49   CT Chest Wo Contrast  Result Date: 12/03/2021 CLINICAL DATA:  Pneumonia suspected; * Tracking Code: BO * EXAM: CT CHEST WITHOUT CONTRAST TECHNIQUE: Multidetector CT imaging of the chest was performed following the standard protocol without IV contrast. RADIATION DOSE REDUCTION: This exam was performed according to the departmental dose-optimization program which includes automated exposure control, adjustment of the mA and/or kV according to patient size and/or use of iterative reconstruction technique. COMPARISON:  Radiograph dated November 26, 2021 FINDINGS: Cardiovascular: Normal heart size. Trace pericardial fluid. No coronary artery calcifications. Normal caliber thoracic aorta with no atherosclerotic disease. Mediastinum/Nodes: Esophagus and thyroid are unremarkable. Bulky enlarged mediastinal and bilateral hilar lymph nodes. Reference right upper paratracheal lymph node measuring 2.9 cm in short axis on series 3, image 19. Reference AP window lymph node measuring 2.7 cm in short axis on image 25. Reference right hilar lymph node measuring 2.5 cm in short axis on series 3, image 29. Reference left hilar lymph node measuring 2.4 cm in short axis on image 77. Lungs/Pleura: Central airways are patent. Innumerable bilateral solid pulmonary nodules which are most prominent in the lower lungs reference right middle lobe pulmonary nodule measuring 2.5 x 1.9 cm on series 4, image 92. Reference left lower lobe pulmonary nodule measuring 2.1 x 1.6 cm on image 113. Perifissural/subpleural nodularity  and interlobular septal thickening. Small right pleural effusion. Upper Abdomen: Enlarged upper abdominal lymph nodes. Reference gastrohepatic ligament lymph node measuring 1.5 cm in short axis on series 2, image 60. Musculoskeletal: No chest wall mass or suspicious bone lesions identified. IMPRESSION: 1. Innumerable bilateral solid pulmonary nodules which are most prominent in the lower lungs, findings are concerning for metastatic disease. 2. 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 3. Bulky mediastinal bilateral hilar lymph nodes, likely due to nodal metastatic disease. 4. Enlarged lymph nodes of the upper abdomen, concerning for additional site of nodal disease. 5. Small left pleural effusion. Electronically Signed   By: Yetta Glassman M.D.   On: 12/03/2021 16:46

## 2021-12-09 NOTE — Progress Notes (Signed)
[facility] PROGRESS NOTE    Progress Note  Melanie Ramirez  HER:[id number] DOB: 03-11-1981 DOA: 12/03/2021 PCP: Lennie Odor, PA     Brief Narrative:   Melanie Ramirez is an 41 y.o. 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  CT abdomen and pelvis showed a solid cystic mass which has replaced the left ovary measuring 7 x 11 x 9 and multiple enlarged lymph nodes within the gastropathic ligament consistent with metastatic disease.  CT angio of the chest was negative for PE showed consolidation she was at empirically on IV Rocephin.  Thought to have metastatic ovarian cancer and transferred to [facility] for initiation of chemotherapy.    Assessment/Plan:   Acute respiratory failure with hypoxia/possible postobstructive pneumonia likely due to metastatic ovarian cancer: -wean O2 as able -tolerated Bipap last PM well- will use QHS and PRN CA125 is 286 IR: status post lung biopsy which results are pending PICC line inserted on 12/04/2021 in anticipation of starting chemotherapy as an inpatient. 6/16: Transferred to [facility] long hospital to start chemotherapy for 6/17 -Dr. Alvy Bimler appreciated - low-dose morphine to help with respirations -ativan for anxiety -PCCM following as well PRN  Anxiety and depression - Ativan as needed, melatonin for sleep. -resume home meds  Possible postobstructive pneumonia leukocytosis: D/c  abx -pro calcitonin low -NP swab negative  Hyponatremia:  -resolved   Plan to leave in SDI/ICU until O2 requirements are closer to 4-6L. Per oncology can be d/c'd home when O2 sats 2L  DVT prophylaxis: lovenox Family Communication: none at bedside Status is: Inpatient Remains inpatient appropriate because: Acute respiratory failure with hypoxia probably due to metastatic ovarian cancer needing inpatient chemo    Code Status:     full      Medical Consultants:   PCCM Oncology IR   Subjective:    Slept well on BIPAP last night, still sleepy this AM  Objective:    Vitals:   12/09/21 0424 12/09/21 0600 12/09/21 0700 12/09/21 0803  BP:  118/60 132/72   Pulse: 91 (!) 102 95   Resp: (!) 25 (!) 25 (!) 29   Temp: 99.2 F (37.3 C) 98.3 F (36.8 C)  97.7 F (36.5 C)  TempSrc: Axillary Axillary  Oral  SpO2: 97% 95% 93%   Weight:      Height:       SpO2: 93 % O2 Flow Rate (L/min): 9 L/min FiO2 (%): 50 %   Intake/Output Summary (Last 24 hours) at 12/09/2021 0926 Last data filed at 12/09/2021 0600 Gross per 24 hour  Intake 63.57 ml  Output --  Net 63.57 ml   Filed Weights   12/06/21 2144  Weight: 71.2 kg    Exam:   General: Appearance:     Overweight female in no acute distress- appears more comfortable today at rest     Lungs:     No wheezing, on [place], respirations unlabored  Heart:    Normal heart rate.   MS:   All extremities are intact.  Neurologic:   Awake, alert, oriented x 3. No apparent focal neurological           defect.       Data Reviewed:    Labs: Basic Metabolic Panel: Recent Labs  Lab 12/03/21 1525 12/04/21 0500 12/07/21 0335 12/08/21 0540 12/09/21 0550  NA 134* 132* 137 140 138  K 4.3 4.9 4.3 4.3 4.3  CL 100 101 104 108 105  CO2 21* 20* '26 24 25  '$ GLUCOSE 99 109* 147* 117* 92  BUN '11 12 13 20 '$ 24*  CREATININE 0.70 0.74 0.58 0.62 0.57  CALCIUM 10.0 9.5 8.9 8.9 8.6*  MG  --   --  2.3  --   --    GFR Estimated  Creatinine Clearance: 93.5 mL/min (by C-G formula based on SCr of 0.57 mg/dL). Liver Function Tests: Recent Labs  Lab 12/03/21 2346 12/04/21 0500 12/07/21 0335  AST 46* 45* 28  ALT '28 24 18  '$ ALKPHOS 107 95 93  BILITOT 0.5 0.7 0.4  PROT 7.4 6.5 6.8  ALBUMIN 3.3* 2.9* 2.8*   Recent Labs  Lab 12/03/21 2346  LIPASE 20   No results for input(s): "AMMONIA" in the last 168 hours. Coagulation profile Recent Labs  Lab 12/04/21 1603  INR 1.1   COVID-19 Labs  No results for input(s): "DDIMER", "FERRITIN", "LDH", "CRP" in the last 72 hours.   Lab Results  Component Value Date   SARSCOV2NAA NEGATIVE 12/05/2021   [place] Not Detected 06/15/2019   [place] Not Detected 01/12/2019   SARSCOV2NAA NOT DETECTED 12/04/2018    CBC: Recent Labs  Lab 12/04/21 0500 12/05/21 0450 12/07/21 0335 12/08/21 0540 12/09/21 0550  WBC 14.3* 10.7* 9.4 17.2* 10.2  NEUTROABS 11.0*  --  7.7  --   --   HGB 13.3 11.9* 11.5* 11.5* 11.3*  HCT 38.8 34.4* 33.4* 33.6* 32.8*  MCV 90.0 91.0 89.3 89.4 89.4  PLT 365 335 351 448* 356   Cardiac Enzymes: No results for input(s): "CKTOTAL", "CKMB", "CKMBINDEX", "TROPONINI" in the last 168 hours. BNP (last 3 results) No results for input(s): "PROBNP" in the last 8760 hours. CBG: Recent Labs  Lab 12/08/21 0002  GLUCAP 126*   D-Dimer: No results for input(s): "DDIMER" in the last 72 hours. Hgb A1c: No results for input(s): "HGBA1C" in the last 72 hours. Lipid Profile: No results for input(s): "CHOL", "HDL", "LDLCALC", "TRIG", "CHOLHDL", "LDLDIRECT" in the last 72 hours. Thyroid function studies: No results for input(s): "TSH", "T4TOTAL", "T3FREE", "THYROIDAB" in the last 72 hours.  Invalid input(s): "FREET3" Anemia work up: No results for input(s): "VITAMINB12", "FOLATE", "FERRITIN", "TIBC", "IRON", "RETICCTPCT" in the last 72 hours. Sepsis Labs: Recent Labs  Lab 12/03/21 1513 12/03/21 1525 12/05/21 0450 12/07/21 0335 12/07/21 0344  12/08/21 0540 12/09/21 0550  PROCALCITON  --   --   --   --  0.10  --   --   WBC  --    < > 10.7* 9.4  --  17.2* 10.2  LATICACIDVEN 1.7  --   --   --   --   --   --    < > = values in this interval not displayed.   Microbiology Recent Results (from the past 240 hour(s))  MRSA Next Gen by PCR, Nasal     Status: None   Collection Time: 12/04/21  2:14 PM   Specimen: Nasal Mucosa; Nasal Swab  Result Value Ref Range Status   MRSA by PCR Next Gen NOT DETECTED NOT DETECTED Final  Comment: (NOTE) The GeneXpert MRSA Assay (FDA approved for NASAL specimens only), is one component of a comprehensive MRSA colonization surveillance program. It is not intended to diagnose MRSA infection nor to guide or monitor treatment for MRSA infections. Test performance is not FDA approved in patients less than 35 years old. Performed at [facility], [place] [address] [place], [place] [address]   SARS Coronavirus 2 by RT PCR (hospital order, performed in [facility] hospital lab) *cepheid single result test* Anterior Nasal Swab     Status: None   Collection Time: 12/05/21  7:26 AM   Specimen: Anterior Nasal Swab  Result Value Ref Range Status   SARS Coronavirus 2 by RT PCR NEGATIVE NEGATIVE Final    Comment: (NOTE) SARS-CoV-2 target nucleic acids are NOT DETECTED.  The SARS-CoV-2 RNA is generally detectable in upper and lower respiratory specimens during the acute phase of infection. The lowest concentration of SARS-CoV-2 viral copies this assay can detect is 250 copies / mL. A negative result does not preclude SARS-CoV-2 infection and should not be used as the sole basis for treatment or other patient management decisions.  A negative result may occur with improper specimen collection / handling, submission of specimen other than nasopharyngeal swab, presence of viral mutation(s) within the areas targeted by this assay, and inadequate number of viral copies (<250 copies / mL). A negative result  must be combined with clinical observations, patient history, and epidemiological information.  Fact Sheet for Patients:   https://www.patel.info/  Fact Sheet for Healthcare Providers: https://hall.com/  This test is not yet approved or  cleared by the [place] FDA and has been authorized for detection and/or diagnosis of SARS-CoV-2 by FDA under an Emergency Use Authorization (EUA).  This EUA will remain in effect (meaning this test can be used) for the duration of the COVID-19 declaration under Section 564(b)(1) of the Act, 21 U.S.C. section 360bbb-3(b)(1), unless the authorization is terminated or revoked sooner.  Performed at [facility], [place] [address], [place], [place] [address]   Respiratory (~20 pathogens) panel by PCR     Status: None   Collection Time: 12/07/21 11:32 AM   Specimen: Nasopharyngeal Swab; Respiratory  Result Value Ref Range Status   Adenovirus NOT DETECTED NOT DETECTED Final   Coronavirus 229E NOT DETECTED NOT DETECTED Final    Comment: (NOTE) The Coronavirus on the Respiratory Panel, DOES NOT test for the novel  Coronavirus (2019 nCoV)    Coronavirus HKU1 NOT DETECTED NOT DETECTED Final   Coronavirus NL63 NOT DETECTED NOT DETECTED Final   Coronavirus OC43 NOT DETECTED NOT DETECTED Final   Metapneumovirus NOT DETECTED NOT DETECTED Final   Rhinovirus / Enterovirus NOT DETECTED NOT DETECTED Final   Influenza A NOT DETECTED NOT DETECTED Final   Influenza B NOT DETECTED NOT DETECTED Final   Parainfluenza Virus 1 NOT DETECTED NOT DETECTED Final   Parainfluenza Virus 2 NOT DETECTED NOT DETECTED Final   Parainfluenza Virus 3 NOT DETECTED NOT DETECTED Final   Parainfluenza Virus 4 NOT DETECTED NOT DETECTED Final   Respiratory Syncytial Virus NOT DETECTED NOT DETECTED Final   Bordetella pertussis NOT DETECTED NOT DETECTED Final   Bordetella Parapertussis NOT DETECTED NOT DETECTED Final   Chlamydophila  pneumoniae NOT DETECTED NOT DETECTED Final   Mycoplasma pneumoniae NOT DETECTED NOT DETECTED Final    Comment: Performed at [facility], [place]. [address], [place], [place] [address]     Medications:    buPROPion  300 mg Oral Daily   Chlorhexidine Gluconate Cloth  6 each Topical Daily   citalopram  40 mg Oral Daily   dextromethorphan-guaiFENesin  1 tablet Oral BID   docusate sodium  100 mg Oral Daily   enoxaparin (LOVENOX) injection  40 mg Subcutaneous Q24H   feeding supplement  237 mL Oral BID BM   ipratropium-albuterol  3 mL Nebulization TID   melatonin  3 mg Oral QHS   traZODone  50 mg Oral QHS   Continuous Infusions:  sodium chloride 10 mL/hr at 12/09/21 0602      LOS: 5 days   Geradine Girt  [facility]  12/09/2021, 9:26 AM

## 2021-12-10 ENCOUNTER — Inpatient Hospital Stay (HOSPITAL_COMMUNITY): Payer: BC Managed Care – PPO

## 2021-12-10 DIAGNOSIS — J9601 Acute respiratory failure with hypoxia: Secondary | ICD-10-CM | POA: Diagnosis not present

## 2021-12-10 LAB — BASIC METABOLIC PANEL
Anion gap: 7 (ref 5–15)
BUN: 21 mg/dL — ABNORMAL HIGH (ref 6–20)
CO2: 26 mmol/L (ref 22–32)
Calcium: 9.1 mg/dL (ref 8.9–10.3)
Chloride: 103 mmol/L (ref 98–111)
Creatinine, Ser: 0.52 mg/dL (ref 0.44–1.00)
GFR, Estimated: >60 mL/min (ref >60)
Glucose, Bld: 106 mg/dL — ABNORMAL HIGH (ref 70–99)
Potassium: 4.6 mmol/L (ref 3.5–5.1)
Sodium: 136 mmol/L (ref 135–145)

## 2021-12-10 LAB — LEGIONELLA PNEUMOPHILA SEROGP 1 UR AG: L. pneumophila Serogp 1 Ur Ag: NEGATIVE

## 2021-12-10 LAB — SURGICAL PATHOLOGY

## 2021-12-10 LAB — PHOSPHORUS: Phosphorus: 1.6 mg/dL — ABNORMAL LOW (ref 2.5–4.6)

## 2021-12-10 LAB — URIC ACID: Uric Acid, Serum: 2.7 mg/dL (ref 2.5–7.1)

## 2021-12-10 MED ORDER — POTASSIUM & SODIUM PHOSPHATES 280-160-250 MG PO PACK
1.0000 | PACK | Freq: Three times a day (TID) | ORAL | Status: DC
Start: 2021-12-10 — End: 2021-12-10
  Filled 2021-12-10: qty 1

## 2021-12-10 MED ORDER — SODIUM CHLORIDE 0.9 % IV SOLN
INTRAVENOUS | Status: DC
Start: 2021-12-10 — End: 2021-12-11

## 2021-12-10 MED ORDER — K PHOS MONO-SOD PHOS DI & MONO 155-852-130 MG PO TABS
500.0000 mg | ORAL_TABLET | Freq: Four times a day (QID) | ORAL | Status: AC
Start: 2021-12-10 — End: 2021-12-11
  Administered 2021-12-10 – 2021-12-11 (×3): 500 mg via ORAL
  Filled 2021-12-10 (×3): qty 2

## 2021-12-10 NOTE — Progress Notes (Addendum)
Pt requested to be off BIPAP; resume 8L HFNC (salter), tolerate well.

## 2021-12-10 NOTE — Progress Notes (Signed)
Patient requested that I do not wean her HFNC down from 7L at this time. Pt O2 sats 97% and states that her breathing "feels fine". Respiratory therapist notified. Renae Gloss, RN

## 2021-12-10 NOTE — Progress Notes (Signed)
[facility] PROGRESS NOTE    Progress Note  Melanie Ramirez  WLN:[id number] DOB: [birth year] DOA: 12/03/2021 PCP: Lennie Odor, PA     Brief Narrative:    Melanie Ramirez is an 41 y.o. female past medical history significant for depression anxiety comes into the ED for shortness of breath, has been having a nonproductive cough went to the [place] walk-in clinic chest x-ray showed diffuse bilateral pulmonary infiltrates was started on Doxy and Levaquin, symptoms have persisted.  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  CT abdomen and pelvis showed a solid cystic mass which has replaced the left ovary measuring 7 x 11 x 9 and multiple enlarged lymph nodes within the gastropathic ligament consistent with metastatic disease.  Thought to have metastatic ovarian cancer and transferred to [facility] for initiation of chemotherapy 6/17.  Pathology from biopsy still pending.      Assessment/Plan:   Acute respiratory failure with hypoxia/possible postobstructive pneumonia likely due to metastatic ovarian cancer: -wean O2 as able -tolerated Bipap-- will use QHS and PRN CA125 is 286 IR: status post lung biopsy which results are pending PICC line inserted on 12/04/2021 in anticipation of starting chemotherapy as an inpatient. 6/16: Transferred to [facility] long hospital to start chemotherapy for 6/17 -Dr. Alvy Bimler consult appreciated - low-dose morphine to help with respirations -ativan for anxiety -PCCM following as well PRN  Anxiety and depression - Ativan as needed, melatonin for sleep. -resume home meds  Possible postobstructive pneumonia leukocytosis: D/c abx -pro calcitonin low -NP swab negative  Hyponatremia:  -resolved  Hypophosphatemia -replete  Plan to leave in SDI/ICU until O2  requirements are closer to 4-6L. Per oncology can be d/c'd home when O2 sats 2L  DVT prophylaxis: lovenox Family Communication: none at bedside Status is: Inpatient Remains inpatient appropriate because: Acute respiratory failure with hypoxia probably due to metastatic ovarian cancer needing inpatient chemo    Code Status:     full      Medical Consultants:   PCCM Oncology IR   Subjective:    Says her urine is dark/concentrated- says she is drinking as much water as possible  Objective:    Vitals:   12/10/21 0700 12/10/21 0731 12/10/21 0800 12/10/21 0900  BP: (!) 95/50  119/66 125/60  Pulse: 94  100 (!) 107  Resp: (!) 26  (!) 32 (!) 31  Temp:   97.7 F (36.5 C)   TempSrc:   Oral   SpO2: 98% 96% 95% 93%  Weight:      Height:       SpO2: 93 % O2 Flow Rate (L/min): 8 L/min FiO2 (%): 50 %   Intake/Output Summary (Last 24 hours) at 12/10/2021 1009 Last data filed at 12/10/2021 0636 Gross per 24 hour  Intake 200.72 ml  Output --  Net 200.72 ml   Filed Weights   12/06/21 2144  Weight: 71.2 kg    Exam:   General: Appearance:     Overweight female in no acute distress     Lungs:     Diminished, no wheezing. respirations unlabored  Heart:    Tachycardic.   MS:   All extremities are intact.   Neurologic:   Awake, alert, oriented x 3. No apparent focal neurological           defect.         Data Reviewed:  Labs: Basic Metabolic Panel: Recent Labs  Lab 12/04/21 0500 12/07/21 0335 12/08/21 0540 12/09/21 0550 12/10/21 0500  NA 132* 137 140 138 136  K 4.9 4.3 4.3 4.3 4.6  CL 101 104 108 105 103  CO2 20* '26 24 25 26  '$ GLUCOSE 109* 147* 117* 92 106*  BUN '12 13 20 '$ 24* 21*  CREATININE 0.74 0.58 0.62 0.57 0.52  CALCIUM 9.5 8.9 8.9 8.6* 9.1  MG  --  2.3  --   --   --   PHOS  --   --   --   --  1.6*   GFR Estimated Creatinine Clearance: 93.5 mL/min (by C-G formula based on SCr of 0.52 mg/dL). Liver Function Tests: Recent Labs  Lab  12/03/21 2346 12/04/21 0500 12/07/21 0335  AST 46* 45* 28  ALT '28 24 18  '$ ALKPHOS 107 95 93  BILITOT 0.5 0.7 0.4  PROT 7.4 6.5 6.8  ALBUMIN 3.3* 2.9* 2.8*   Recent Labs  Lab 12/03/21 2346  LIPASE 20   No results for input(s): "AMMONIA" in the last 168 hours. Coagulation profile Recent Labs  Lab 12/04/21 1603  INR 1.1   COVID-19 Labs  No results for input(s): "DDIMER", "FERRITIN", "LDH", "CRP" in the last 72 hours.   Lab Results  Component Value Date   SARSCOV2NAA NEGATIVE 12/05/2021   [place] Not Detected 06/15/2019   [place] Not Detected 01/12/2019   SARSCOV2NAA NOT DETECTED 12/04/2018    CBC: Recent Labs  Lab 12/04/21 0500 12/05/21 0450 12/07/21 0335 12/08/21 0540 12/09/21 0550  WBC 14.3* 10.7* 9.4 17.2* 10.2  NEUTROABS 11.0*  --  7.7  --   --   HGB 13.3 11.9* 11.5* 11.5* 11.3*  HCT 38.8 34.4* 33.4* 33.6* 32.8*  MCV 90.0 91.0 89.3 89.4 89.4  PLT 365 335 351 448* 356   Cardiac Enzymes: No results for input(s): "CKTOTAL", "CKMB", "CKMBINDEX", "TROPONINI" in the last 168 hours. BNP (last 3 results) No results for input(s): "PROBNP" in the last 8760 hours. CBG: Recent Labs  Lab 12/08/21 0002  GLUCAP 126*   D-Dimer: No results for input(s): "DDIMER" in the last 72 hours. Hgb A1c: No results for input(s): "HGBA1C" in the last 72 hours. Lipid Profile: No results for input(s): "CHOL", "HDL", "LDLCALC", "TRIG", "CHOLHDL", "LDLDIRECT" in the last 72 hours. Thyroid function studies: No results for input(s): "TSH", "T4TOTAL", "T3FREE", "THYROIDAB" in the last 72 hours.  Invalid input(s): "FREET3" Anemia work up: No results for input(s): "VITAMINB12", "FOLATE", "FERRITIN", "TIBC", "IRON", "RETICCTPCT" in the last 72 hours. Sepsis Labs: Recent Labs  Lab 12/03/21 1513 12/03/21 1525 12/05/21 0450 12/07/21 0335 12/07/21 0344 12/08/21 0540 12/09/21 0550  PROCALCITON  --   --   --   --  0.10  --   --   WBC  --    < > 10.7* 9.4  --  17.2* 10.2   LATICACIDVEN 1.7  --   --   --   --   --   --    < > = values in this interval not displayed.   Microbiology Recent Results (from the past 240 hour(s))  MRSA Next Gen by PCR, Nasal     Status: None   Collection Time: 12/04/21  2:14 PM   Specimen: Nasal Mucosa; Nasal Swab  Result Value Ref Range Status   MRSA by PCR Next Gen NOT DETECTED NOT DETECTED Final    Comment: (NOTE) The GeneXpert MRSA Assay (FDA approved for NASAL specimens only), is one component of  a comprehensive MRSA colonization surveillance program. It is not intended to diagnose MRSA infection nor to guide or monitor treatment for MRSA infections. Test performance is not FDA approved in patients less than 30 years old. Performed at [facility], [place] [address], [place], [place] [address]   SARS Coronavirus 2 by RT PCR (hospital order, performed in [facility] hospital lab) *cepheid single result test* Anterior Nasal Swab     Status: None   Collection Time: 12/05/21  7:26 AM   Specimen: Anterior Nasal Swab  Result Value Ref Range Status   SARS Coronavirus 2 by RT PCR NEGATIVE NEGATIVE Final    Comment: (NOTE) SARS-CoV-2 target nucleic acids are NOT DETECTED.  The SARS-CoV-2 RNA is generally detectable in upper and lower respiratory specimens during the acute phase of infection. The lowest concentration of SARS-CoV-2 viral copies this assay can detect is 250 copies / mL. A negative result does not preclude SARS-CoV-2 infection and should not be used as the sole basis for treatment or other patient management decisions.  A negative result may occur with improper specimen collection / handling, submission of specimen other than nasopharyngeal swab, presence of viral mutation(s) within the areas targeted by this assay, and inadequate number of viral copies (<250 copies / mL). A negative result must be combined with clinical observations, patient history, and epidemiological information.  Fact Sheet for  Patients:   https://www.patel.info/  Fact Sheet for Healthcare Providers: https://hall.com/  This test is not yet approved or  cleared by the [place] FDA and has been authorized for detection and/or diagnosis of SARS-CoV-2 by FDA under an Emergency Use Authorization (EUA).  This EUA will remain in effect (meaning this test can be used) for the duration of the COVID-19 declaration under Section 564(b)(1) of the Act, 21 U.S.C. section 360bbb-3(b)(1), unless the authorization is terminated or revoked sooner.  Performed at [facility], [place] [address], [place], [place] [address]   Respiratory (~20 pathogens) panel by PCR     Status: None   Collection Time: 12/07/21 11:32 AM   Specimen: Nasopharyngeal Swab; Respiratory  Result Value Ref Range Status   Adenovirus NOT DETECTED NOT DETECTED Final   Coronavirus 229E NOT DETECTED NOT DETECTED Final    Comment: (NOTE) The Coronavirus on the Respiratory Panel, DOES NOT test for the novel  Coronavirus (2019 nCoV)    Coronavirus HKU1 NOT DETECTED NOT DETECTED Final   Coronavirus NL63 NOT DETECTED NOT DETECTED Final   Coronavirus OC43 NOT DETECTED NOT DETECTED Final   Metapneumovirus NOT DETECTED NOT DETECTED Final   Rhinovirus / Enterovirus NOT DETECTED NOT DETECTED Final   Influenza A NOT DETECTED NOT DETECTED Final   Influenza B NOT DETECTED NOT DETECTED Final   Parainfluenza Virus 1 NOT DETECTED NOT DETECTED Final   Parainfluenza Virus 2 NOT DETECTED NOT DETECTED Final   Parainfluenza Virus 3 NOT DETECTED NOT DETECTED Final   Parainfluenza Virus 4 NOT DETECTED NOT DETECTED Final   Respiratory Syncytial Virus NOT DETECTED NOT DETECTED Final   Bordetella pertussis NOT DETECTED NOT DETECTED Final   Bordetella Parapertussis NOT DETECTED NOT DETECTED Final   Chlamydophila pneumoniae NOT DETECTED NOT DETECTED Final   Mycoplasma pneumoniae NOT DETECTED NOT DETECTED Final    Comment:  Performed at [facility], [place]. [address], [place], [place] [address]     Medications:    buPROPion  300 mg Oral Daily   Chlorhexidine Gluconate Cloth  6 each Topical Daily  citalopram  40 mg Oral Daily   dextromethorphan-guaiFENesin  1 tablet Oral BID   docusate sodium  100 mg Oral Daily   enoxaparin (LOVENOX) injection  40 mg Subcutaneous Q24H   feeding supplement  237 mL Oral BID BM   ipratropium  0.5 mg Nebulization TID   levalbuterol  0.63 mg Nebulization TID   melatonin  3 mg Oral QHS   potassium & sodium phosphates  1 packet Oral TID WC & HS   traZODone  50 mg Oral QHS   Continuous Infusions:  sodium chloride 10 mL/hr at 12/10/21 0636   sodium chloride 75 mL/hr at 12/10/21 0825      LOS: 6 days   Geradine Girt  [facility]  12/10/2021, 10:09 AM

## 2021-12-10 NOTE — Progress Notes (Addendum)
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 CA125 is elevated at 296 Preliminary results confirmed malignancy by IHC stains pending, anticipate final pathology report later today She tolerated cycle 1 day 1 treatment on December 07, 2701 without complications Continue supportive care   Acute hypoxic respiratory failure Due to bulky disease in the lung and possible lymphangitic spread She is doing a bit better without nonrebreather but on high flow oxygen She tolerated BiPAP overnight well I would defer to the team for further evaluation and management   Mild normocytic anemia Noted to have mild normocytic anemia Monitor   Elevated uric acid level, at risk for tumor lysis, resolving She received 1 dose of rasburicase on 12/08/2021 The patient is hydrating adequately by mouth   Goals of care She has completed cycle 1 day 1 of treatment on June 17 Continue supportive care with goal to resolve respiratory failure prior to discharge   Discharge planning Not safe for discharge until she can be discharged on 2 Liters of oxygen She is aware that I am not here tomorrow but I will return to check on her on Thursday  All questions were answered. The patient knows to call the clinic with any problems, questions or concerns.   Mikey Bussing, NP 12/10/2021 8:19 AM Melanie Ramirez Subjective:  She had no new symptoms since I saw her Tolerating BiPAP well overnight Currently on O2 at 8 L/min  Objective:  Vitals:   12/10/21 0700 12/10/21 0731  BP: (!) 95/50   Pulse: 94   Resp: (!) 26   Temp:    SpO2: 98% 96%     Intake/Output Summary (Last 24  hours) at 12/10/2021 0819 Last data filed at 12/10/2021 0636 Gross per 24 hour  Intake 238.64 ml  Output --  Net 238.64 ml    GENERAL:alert, no distress and comfortable.  She appears to be breathing comfortably on the 20 breaths/min on 8 L of oxygen Lungs: Clear NEURO: alert & oriented x 3 with fluent speech, no focal motor/sensory deficits   Labs:  Recent Labs    12/03/21 2346 12/04/21 0500 12/07/21 0335 12/08/21 0540 12/09/21 0550 12/10/21 0500  NA  --  132* 137 140 138 136  K  --  4.9 4.3 4.3 4.3 4.6  CL  --  101 104 108 105 103  CO2  --  20* '26 24 25 26  '$ GLUCOSE  --  109* 147* 117* 92 106*  BUN  --  '12 13 20 '$ 24* 21*  CREATININE  --  0.74 0.58 0.62 0.57 0.52  CALCIUM  --  9.5 8.9 8.9 8.6* 9.1  GFRNONAA  --  >60 >60 >60 >60 >60  PROT 7.4 6.5 6.8  --   --   --   ALBUMIN 3.3* 2.9* 2.8*  --   --   --   AST 46* 45* 28  --   --   --   ALT '28 24 18  '$ --   --   --   ALKPHOS 107 95 93  --   --   --   BILITOT 0.5 0.7 0.4  --   --   --   BILIDIR <0.1  --   --   --   --   --  IBILI NOT CALCULATED  --   --   --   --   --     Studies:  DG Chest Port 1 View  Result Date: 12/06/2021 CLINICAL DATA:  Provided history: Acute respiratory failure with hypoxia. Shortness of breath. EXAM: PORTABLE CHEST 1 VIEW COMPARISON:  Chest radiograph 12/05/2021 and earlier. Chest CT 12/04/2021. FINDINGS: Unchanged position of a right-sided PICC with tip projecting at the level of the superior cavoatrial junction. The cardiomediastinal silhouette is unchanged. Known mediastinal and hilar lymphadenopathy. Again demonstrated are extensive mid to lower lung zone predominant opacities compatible with pulmonary metastatic disease. Superimposed pneumonia cannot be excluded. Findings are similar to the prior chest radiograph of 12/05/2021. A portion of the left lateral costophrenic angle is excluded from the field of view. Small bilateral pleural effusions. No evidence of pneumothorax. IMPRESSION: No significant  change from the prior chest radiograph 12/05/2021. Redemonstrated extensive mid-to-lower lung zone predominant opacities compatible with pulmonary metastatic disease. Superimposed pneumonia cannot be excluded. Small bilateral pleural effusions. Electronically Signed   By: Kellie Simmering D.O.   On: 12/06/2021 09:14   CT BIOPSY  Result Date: 12/05/2021 INDICATION: Multiple lung masses EXAM: CT-guided biopsy of right lower lung mass MEDICATIONS: Per EMR ANESTHESIA/SEDATION: Local analgesia FLUOROSCOPY TIME:  N/a COMPLICATIONS: None immediate. PROCEDURE: Informed written consent was obtained from the patient after a thorough discussion of the procedural risks, benefits and alternatives. All questions were addressed. Maximal Sterile Barrier Technique was utilized including caps, mask, sterile gowns, sterile gloves, sterile drape, hand hygiene and skin antiseptic. A timeout was performed prior to the initiation of the procedure. The patient was placed in the lateral decubitus position with right side down on the CT exam table. Limited CT of the chest was performed for planning purposes. This again demonstrated innumerable bilateral pulmonary masses. Inappropriate right lower lung subpleural nodule/mass was selected as appropriate for percutaneous biopsy. Skin entry site was marked, and the overlying skin was prepped and draped in the standard sterile fashion. Local analgesia was obtained with 1% lidocaine. Using intermittent CT fluoroscopy, a 17 gauge introducer needle was advanced towards the target subpleural nodule in the right lower lobe. Subsequently, core needle biopsy was performed using an 18 gauge core biopsy device x4 total passes. Grossly adequate appearing specimens were submitted in formalin to pathology for further handling. Needle was removed and an occlusive dressing was placed. Postprocedure imaging demonstrated small volume bleeding surrounding the biopsied mass within expected limits and found to be  stable over a short monitoring interval. Patient overall tolerated the procedure well, and was transferred to recovery in stable condition. IMPRESSION: Successful CT-guided core needle biopsy of right lower lung pulmonary nodule/mass. Electronically Signed   By: Albin Felling M.D.   On: 12/05/2021 15:02   DG Chest Port 1 View  Result Date: 12/05/2021 CLINICAL DATA:  Right lung biopsy EXAM: PORTABLE CHEST 1 VIEW COMPARISON:  11/26/2021 FINDINGS: Right-sided PICC line terminates at the level of the superior cavoatrial junction. Stable heart size. Low lung volumes. Extensive bilateral airspace opacities compatible with diffuse pulmonary metastatic disease. Prominence of the mediastinal and hilar borders compatible with underlying lymphadenopathy. Low lung volumes. No pneumothorax identified. Portion of the lung apices obscured by patient's chin. IMPRESSION: 1. No pneumothorax identified following right lung biopsy. 2. Right-sided PICC line terminates at the level of the superior cavoatrial junction. 3. Extensive bilateral airspace opacities compatible with diffuse pulmonary metastatic disease. Electronically Signed   By: Davina Poke D.O.   On: 12/05/2021 11:29  [place] EKG SITE RITE  Result Date: 12/04/2021 If Site Rite image not attached, placement could not be confirmed due to current cardiac rhythm.  CT Angio Chest Pulmonary Embolism (PE) W or WO Contrast  Result Date: 12/04/2021 CLINICAL DATA:  Chest pain or SOB, pleurisy or effusion suspected EXAM: CT ANGIOGRAPHY CHEST WITH CONTRAST TECHNIQUE: Multidetector CT imaging of the chest was performed using the standard protocol during bolus administration of intravenous contrast. Multiplanar CT image reconstructions and MIPs were obtained to evaluate the vascular anatomy. RADIATION DOSE REDUCTION: This exam was performed according to the departmental dose-optimization program which includes automated exposure control, adjustment of the mA and/or kV  according to patient size and/or use of iterative reconstruction technique. CONTRAST:  28m OMNIPAQUE IOHEXOL 350 MG/ML SOLN COMPARISON:  12/03/2021 FINDINGS: Cardiovascular: SVC is patent. Heart size normal. No pericardial effusion. Satisfactory opacification of pulmonary arteries noted, and there is no evidence of pulmonary emboli. There is encasement and narrowing of right upper lobe pulmonary artery branch by tumor. Adequate contrast opacification of the thoracic aorta with no evidence of dissection, aneurysm, or stenosis. There is classic 3-vessel brachiocephalic arch anatomy without proximal stenosis. Mediastinum/Nodes: Bulky mediastinal and bilateral hilar adenopathy as seen on previous day's exam Lungs/Pleura: Innumerable pulmonary nodules bilaterally as before. Progressive airspace consolidation at the right lung base base. Trace left pleural effusion as before. No pneumothorax. Upper Abdomen: Stable gastrohepatic lymphadenopathy. No acute findings. Musculoskeletal: No chest wall abnormality. No acute or significant osseous findings. Review of the MIP images confirms the above findings. IMPRESSION: 1. Negative for acute PE or thoracic aortic dissection. 2. Encasement and narrowing of the right upper lobe pulmonary artery by tumor. 3. Stable bilateral pulmonary nodules, hilar and mediastinal adenopathy. 4. Progressive consolidation at the right lung base Electronically Signed   By: DLucrezia EuropeM.D.   On: 12/04/2021 10:42   CT ABDOMEN PELVIS W CONTRAST  Result Date: 12/04/2021 CLINICAL DATA:  Weight loss, unintended masses on lung CT, weight loss, lymph involvement. Pulmonary metastatic disease of unknown primary. EXAM: CT ABDOMEN AND PELVIS WITH CONTRAST TECHNIQUE: Multidetector CT imaging of the abdomen and pelvis was performed using the standard protocol following bolus administration of intravenous contrast. RADIATION DOSE REDUCTION: This exam was performed according to the departmental  dose-optimization program which includes automated exposure control, adjustment of the mA and/or kV according to patient size and/or use of iterative reconstruction technique. CONTRAST:  812mOMNIPAQUE IOHEXOL 300 MG/ML  SOLN COMPARISON:  None. Findings are correlated with CT examination of the chest of 12/03/2021. FINDINGS: Lower chest: Innumerable pulmonary masses are again identified within the visualized lung bases bilaterally, better evaluated on previously performed dedicated CT imaging of the chest. Bulky bilateral pleural metastatic disease noted. Small left pleural effusion again seen. Pericardial lymphadenopathy noted. Hepatobiliary: No enhancing intrahepatic mass. No intra or extrahepatic biliary ductal dilation. Cholelithiasis without pericholecystic inflammatory change noted. Pancreas: Unremarkable Spleen: Unremarkable Adrenals/Urinary Tract: Adrenal glands are unremarkable. Kidneys are normal, without renal calculi, focal lesion, or hydronephrosis. Bladder is unremarkable. Stomach/Bowel: Stomach is within normal limits. Appendix appears normal. No evidence of bowel wall thickening, distention, or inflammatory changes. Vascular/Lymphatic: Multiple pathologically enlarged lymph nodes are seen within the gastrohepatic ligament measuring up to 18 mm in short axis diameter at axial image # 23/3. The abdominal vasculature is unremarkable. Reproductive: A mixed solid and cystic mass replaces the left ovary measuring roughly 6.8 x 11.0 x 9.2 cm in greatest dimension. This may represent a primary ovarian mass or an ovarian metastasis. Uterus  and right ovary are unremarkable. Other: No abdominal wall hernia. Musculoskeletal: No acute bone abnormality. No lytic or blastic bone lesion. IMPRESSION: 1. A mixed solid and cystic mass replaces the left ovary measuring roughly 6.8 x 11.0 x 9.2 cm in greatest dimension. This may represent a primary ovarian mass or an ovarian metastasis. 2. Multiple pathologically  enlarged lymph nodes within the gastrohepatic ligament, consistent with metastatic disease. 3. Innumerable pulmonary masses within the visualized lung bases bilaterally, better evaluated on previously performed dedicated CT imaging of the chest. 4. Small left pleural effusion. 5. Cholelithiasis without pericholecystic inflammatory change. Electronically Signed   By: Fidela Salisbury M.D.   On: 12/04/2021 01:49   CT Chest Wo Contrast  Result Date: 12/03/2021 CLINICAL DATA:  Pneumonia suspected; * Tracking Code: BO * EXAM: CT CHEST WITHOUT CONTRAST TECHNIQUE: Multidetector CT imaging of the chest was performed following the standard protocol without IV contrast. RADIATION DOSE REDUCTION: This exam was performed according to the departmental dose-optimization program which includes automated exposure control, adjustment of the mA and/or kV according to patient size and/or use of iterative reconstruction technique. COMPARISON:  Radiograph dated November 26, 2021 FINDINGS: Cardiovascular: Normal heart size. Trace pericardial fluid. No coronary artery calcifications. Normal caliber thoracic aorta with no atherosclerotic disease. Mediastinum/Nodes: Esophagus and thyroid are unremarkable. Bulky enlarged mediastinal and bilateral hilar lymph nodes. Reference right upper paratracheal lymph node measuring 2.9 cm in short axis on series 3, image 19. Reference AP window lymph node measuring 2.7 cm in short axis on image 25. Reference right hilar lymph node measuring 2.5 cm in short axis on series 3, image 29. Reference left hilar lymph node measuring 2.4 cm in short axis on image 77. Lungs/Pleura: Central airways are patent. Innumerable bilateral solid pulmonary nodules which are most prominent in the lower lungs reference right middle lobe pulmonary nodule measuring 2.5 x 1.9 cm on series 4, image 92. Reference left lower lobe pulmonary nodule measuring 2.1 x 1.6 cm on image 113. Perifissural/subpleural nodularity and interlobular  septal thickening. Small right pleural effusion. Upper Abdomen: Enlarged upper abdominal lymph nodes. Reference gastrohepatic ligament lymph node measuring 1.5 cm in short axis on series 2, image 60. Musculoskeletal: No chest wall mass or suspicious bone lesions identified. IMPRESSION: 1. Innumerable bilateral solid pulmonary nodules which are most prominent in the lower lungs, findings are concerning for metastatic disease. 2. 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 3. Bulky mediastinal bilateral hilar lymph nodes, likely due to nodal metastatic disease. 4. Enlarged lymph nodes of the upper abdomen, concerning for additional site of nodal disease. 5. Small left pleural effusion. Electronically Signed   By: Yetta Glassman M.D.   On: 12/03/2021 16:46

## 2021-12-11 DIAGNOSIS — F419 Anxiety disorder, unspecified: Secondary | ICD-10-CM | POA: Diagnosis not present

## 2021-12-11 DIAGNOSIS — J9601 Acute respiratory failure with hypoxia: Secondary | ICD-10-CM | POA: Diagnosis not present

## 2021-12-11 DIAGNOSIS — F32A Depression, unspecified: Secondary | ICD-10-CM | POA: Diagnosis not present

## 2021-12-11 DIAGNOSIS — C7800 Secondary malignant neoplasm of unspecified lung: Secondary | ICD-10-CM | POA: Diagnosis not present

## 2021-12-11 LAB — BASIC METABOLIC PANEL
Anion gap: 7 (ref 5–15)
BUN: 16 mg/dL (ref 6–20)
CO2: 23 mmol/L (ref 22–32)
Calcium: 8.3 mg/dL — ABNORMAL LOW (ref 8.9–10.3)
Chloride: 107 mmol/L (ref 98–111)
Creatinine, Ser: 0.52 mg/dL (ref 0.44–1.00)
GFR, Estimated: >60 mL/min (ref >60)
Glucose, Bld: 96 mg/dL (ref 70–99)
Potassium: 4.1 mmol/L (ref 3.5–5.1)
Sodium: 137 mmol/L (ref 135–145)

## 2021-12-11 LAB — CBC
HCT: 34.4 % — ABNORMAL LOW (ref 36.0–46.0)
Hemoglobin: 11.7 g/dL — ABNORMAL LOW (ref 12.0–15.0)
MCH: 30 pg (ref 26.0–34.0)
MCHC: 34 g/dL (ref 30.0–36.0)
MCV: 88.2 fL (ref 80.0–100.0)
Platelets: 281 10*3/uL (ref 150–400)
RBC: 3.9 MIL/uL (ref 3.87–5.11)
RDW: 11.5 % (ref 11.5–15.5)
WBC: 6.2 10*3/uL (ref 4.0–10.5)
nRBC: 0 % (ref 0.0–0.2)

## 2021-12-11 LAB — PHOSPHORUS: Phosphorus: 2.1 mg/dL — ABNORMAL LOW (ref 2.5–4.6)

## 2021-12-11 MED ORDER — K PHOS MONO-SOD PHOS DI & MONO 155-852-130 MG PO TABS
500.0000 mg | ORAL_TABLET | Freq: Four times a day (QID) | ORAL | Status: AC
  Administered 2021-12-11 (×3): 500 mg via ORAL
  Filled 2021-12-11 (×3): qty 2

## 2021-12-11 MED ORDER — PROCHLORPERAZINE EDISYLATE 10 MG/2ML IJ SOLN
10.0000 mg | Freq: Four times a day (QID) | INTRAMUSCULAR | Status: DC | PRN
  Administered 2021-12-11: 10 mg via INTRAVENOUS
  Filled 2021-12-11: qty 2

## 2021-12-11 NOTE — Progress Notes (Signed)
Pt given scheduled nebulizer treatment which she tolerated well.  HR92, rr24, spo2 95% on 4l [place].  No increased wob/respiratory distress noted or voiced by patient.  Bipap in room on standby but not indicated at this time.

## 2021-12-11 NOTE — Progress Notes (Signed)
[facility] PROGRESS NOTE    Progress Note  Melanie Ramirez  JOA:[id number] DOB: 04-27-1981 DOA: 12/03/2021 PCP: Lennie Odor, PA     Brief Narrative:    Melanie Ramirez is an 41 y.o. female past medical history significant for depression anxiety comes into the ED for shortness of breath, has been having a nonproductive cough went to the [place] walk-in clinic chest x-ray showed diffuse bilateral pulmonary infiltrates was started on Doxy and Levaquin, symptoms have persisted.  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  CT abdomen and pelvis showed a solid cystic mass which has replaced the left ovary measuring 7 x 11 x 9 and multiple enlarged lymph nodes within the gastropathic ligament consistent with metastatic disease.  Thought to have metastatic ovarian cancer and transferred to [facility] for initiation of chemotherapy 6/17.  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 6/16: Transferred to [facility] long hospital to start chemotherapy for 6/17 -Dr. Alvy Bimler consult appreciated - low-dose morphine to help with respirations -ativan for anxiety -PCCM following as well PRN  Anxiety and depression - Ativan as needed, melatonin for sleep. -resume home meds  Possible postobstructive pneumonia leukocytosis: D/c abx -pro calcitonin low -NP swab  negative  Hyponatremia:  -resolved  Hypophosphatemia -replete  Per oncology can be d/c'd home when O2 sats 2L. Can transfer out of SDU today  DVT prophylaxis: lovenox Family Communication: none at bedside Status is: Inpatient Remains inpatient appropriate because: Acute respiratory failure with hypoxia probably due to metastatic ovarian cancer needing inpatient chemo    Code Status:     full      Medical Consultants:   PCCM Oncology IR   Subjective:    She is feeling better. She is starting to bring up some sputum with cough.  Objective:    Vitals:   12/11/21 1000 12/11/21 1100 12/11/21 1200 12/11/21 1355  BP: (!) 109/49 112/62 104/63   Pulse: 92 96 96   Resp: 18 (!) 26 19   Temp:   97.6 F (36.4 C)   TempSrc:   Oral   SpO2: 97% 94% 95% 96%  Weight:      Height:       SpO2: 96 % O2 Flow Rate (L/min): 4 L/min FiO2 (%): 50 %   Intake/Output Summary (Last 24 hours) at 12/11/2021 1419 Last data filed at 12/11/2021 0400 Gross per 24 hour  Intake 1127.67 ml  Output 0 ml  Net 1127.67 ml   Filed Weights   12/06/21 2144  Weight: 71.2 kg    Exam:   General exam: Alert, awake, oriented x 3 Respiratory system: Clear to auscultation. Respiratory effort normal. Cardiovascular system:RRR. No murmurs, rubs, gallops. Gastrointestinal system: Abdomen is nondistended, soft and nontender. No organomegaly or masses felt. Normal bowel sounds heard. Central nervous system: Alert and oriented. No focal neurological deficits. Extremities: No C/C/E, +pedal pulses Skin: No rashes, lesions or ulcers Psychiatry: Judgement and insight appear normal. Mood & affect appropriate.  Data Reviewed:    Labs: Basic Metabolic Panel: Recent Labs  Lab 12/07/21 0335 12/08/21 0540 12/09/21 0550 12/10/21 0500 12/11/21 0524  NA 137 140 138 136 137  K 4.3 4.3 4.3 4.6 4.1  CL 104 108 105 103 107  CO2 '26 24 25 26 23  '$ GLUCOSE 147* 117* 92 106* 96  BUN 13 20 24*  21* 16  CREATININE 0.58 0.62 0.57 0.52 0.52  CALCIUM 8.9 8.9 8.6* 9.1 8.3*  MG 2.3  --   --   --   --   PHOS  --   --   --  1.6* 2.1*   GFR Estimated Creatinine Clearance: 93.5 mL/min (by C-G formula based on SCr of 0.52 mg/dL). Liver Function Tests: Recent Labs  Lab 12/07/21 0335  AST 28  ALT 18  ALKPHOS 93  BILITOT 0.4  PROT 6.8  ALBUMIN 2.8*   No results for input(s): "LIPASE", "AMYLASE" in the last 168 hours.  No results for input(s): "AMMONIA" in the last 168 hours. Coagulation profile Recent Labs  Lab 12/04/21 1603  INR 1.1   COVID-19 Labs  No results for input(s): "DDIMER", "FERRITIN", "LDH", "CRP" in the last 72 hours.   Lab Results  Component Value Date   SARSCOV2NAA NEGATIVE 12/05/2021   [place] Not Detected 06/15/2019   [place] Not Detected 01/12/2019   SARSCOV2NAA NOT DETECTED 12/04/2018    CBC: Recent Labs  Lab 12/05/21 0450 12/07/21 0335 12/08/21 0540 12/09/21 0550 12/11/21 0524  WBC 10.7* 9.4 17.2* 10.2 6.2  NEUTROABS  --  7.7  --   --   --   HGB 11.9* 11.5* 11.5* 11.3* 11.7*  HCT 34.4* 33.4* 33.6* 32.8* 34.4*  MCV 91.0 89.3 89.4 89.4 88.2  PLT 335 351 448* 356 281   Cardiac Enzymes: No results for input(s): "CKTOTAL", "CKMB", "CKMBINDEX", "TROPONINI" in the last 168 hours. BNP (last 3 results) No results for input(s): "PROBNP" in the last 8760 hours. CBG: Recent Labs  Lab 12/08/21 0002  GLUCAP 126*   D-Dimer: No results for input(s): "DDIMER" in the last 72 hours. Hgb A1c: No results for input(s): "HGBA1C" in the last 72 hours. Lipid Profile: No results for input(s): "CHOL", "HDL", "LDLCALC", "TRIG", "CHOLHDL", "LDLDIRECT" in the last 72 hours. Thyroid function studies: No results for input(s): "TSH", "T4TOTAL", "T3FREE", "THYROIDAB" in the last 72 hours.  Invalid input(s): "FREET3" Anemia work up: No results for input(s): "VITAMINB12", "FOLATE", "FERRITIN", "TIBC", "IRON", "RETICCTPCT" in the last 72 hours. Sepsis  Labs: Recent Labs  Lab 12/07/21 0335 12/07/21 0344 12/08/21 0540 12/09/21 0550 12/11/21 0524  PROCALCITON  --  0.10  --   --   --   WBC 9.4  --  17.2* 10.2 6.2   Microbiology Recent Results (from the past 240 hour(s))  MRSA Next Gen by PCR, Nasal     Status: None   Collection Time: 12/04/21  2:14 PM   Specimen: Nasal Mucosa; Nasal Swab  Result Value Ref Range Status   MRSA by PCR Next Gen NOT DETECTED NOT DETECTED Final    Comment: (NOTE) The GeneXpert MRSA Assay (FDA approved for NASAL specimens only), is one component of a comprehensive MRSA colonization surveillance program. It is not intended to diagnose MRSA infection nor to guide or monitor treatment for MRSA infections. Test performance is not FDA approved in patients less than 62 years old. Performed at [facility], [place] [address] [place], [place] [address]   SARS Coronavirus 2 by RT PCR (hospital order,  performed in [facility] hospital lab) *cepheid single result test* Anterior Nasal Swab     Status: None   Collection Time: 12/05/21  7:26 AM   Specimen: Anterior Nasal Swab  Result Value Ref Range Status   SARS Coronavirus 2 by RT PCR NEGATIVE NEGATIVE Final    Comment: (NOTE) SARS-CoV-2 target nucleic acids are NOT DETECTED.  The SARS-CoV-2 RNA is generally detectable in upper and lower respiratory specimens during the acute phase of infection. The lowest concentration of SARS-CoV-2 viral copies this assay can detect is 250 copies / mL. A negative result does not preclude SARS-CoV-2 infection and should not be used as the sole basis for treatment or other patient management decisions.  A negative result may occur with improper specimen collection / handling, submission of specimen other than nasopharyngeal swab, presence of viral mutation(s) within the areas targeted by this assay, and inadequate number of viral copies (<250 copies / mL). A negative result must be combined with clinical observations,  patient history, and epidemiological information.  Fact Sheet for Patients:   https://www.patel.info/  Fact Sheet for Healthcare Providers: https://hall.com/  This test is not yet approved or  cleared by the [place] FDA and has been authorized for detection and/or diagnosis of SARS-CoV-2 by FDA under an Emergency Use Authorization (EUA).  This EUA will remain in effect (meaning this test can be used) for the duration of the COVID-19 declaration under Section 564(b)(1) of the Act, 21 U.S.C. section 360bbb-3(b)(1), unless the authorization is terminated or revoked sooner.  Performed at [facility], [place] [address], [place], [place] [address]   Respiratory (~20 pathogens) panel by PCR     Status: None   Collection Time: 12/07/21 11:32 AM   Specimen: Nasopharyngeal Swab; Respiratory  Result Value Ref Range Status   Adenovirus NOT DETECTED NOT DETECTED Final   Coronavirus 229E NOT DETECTED NOT DETECTED Final    Comment: (NOTE) The Coronavirus on the Respiratory Panel, DOES NOT test for the novel  Coronavirus (2019 nCoV)    Coronavirus HKU1 NOT DETECTED NOT DETECTED Final   Coronavirus NL63 NOT DETECTED NOT DETECTED Final   Coronavirus OC43 NOT DETECTED NOT DETECTED Final   Metapneumovirus NOT DETECTED NOT DETECTED Final   Rhinovirus / Enterovirus NOT DETECTED NOT DETECTED Final   Influenza A NOT DETECTED NOT DETECTED Final   Influenza B NOT DETECTED NOT DETECTED Final   Parainfluenza Virus 1 NOT DETECTED NOT DETECTED Final   Parainfluenza Virus 2 NOT DETECTED NOT DETECTED Final   Parainfluenza Virus 3 NOT DETECTED NOT DETECTED Final   Parainfluenza Virus 4 NOT DETECTED NOT DETECTED Final   Respiratory Syncytial Virus NOT DETECTED NOT DETECTED Final   Bordetella pertussis NOT DETECTED NOT DETECTED Final   Bordetella Parapertussis NOT DETECTED NOT DETECTED Final   Chlamydophila pneumoniae NOT DETECTED NOT DETECTED Final    Mycoplasma pneumoniae NOT DETECTED NOT DETECTED Final    Comment: Performed at [facility], [place]. [address] [place], [place] [address]     Medications:    buPROPion  300 mg Oral Daily   Chlorhexidine Gluconate Cloth  6 each Topical Daily   citalopram  40 mg Oral Daily   dextromethorphan-guaiFENesin  1 tablet Oral BID   docusate sodium  100 mg Oral Daily   enoxaparin (LOVENOX) injection  40 mg Subcutaneous Q24H   feeding supplement  237 mL Oral BID BM   ipratropium  0.5 mg Nebulization TID   levalbuterol  0.63 mg Nebulization TID  melatonin  3 mg Oral QHS   phosphorus  500 mg Oral QID   traZODone  50 mg Oral QHS   Continuous Infusions:  sodium chloride Stopped (12/10/21 0824)      LOS: 7 days   [employer]  [facility]  12/11/2021, 2:19 PM

## 2021-12-11 NOTE — Progress Notes (Addendum)
NAME:  Melanie Ramirez, MRN:  [id number], DOB:  03-26-1981, LOS: 7 ADMISSION DATE:  12/03/2021, CONSULTATION DATE:  12/04/21 REFERRING MD:  Aileen Fass CHIEF COMPLAINT:  Dyspnea, cough   BRIEF  Melanie Ramirez is a 41 y.o. female who has a PMH as below. She presented to [facility] ED 6/14 with ongoing dyspnea and cough. Symptoms first started June 3 and she went to urgent care walk in clinic on 6/4 where CXR suggested infectious process / PNA. She was treated with IM Rocephin and PO Levaquin.  During that time frame she also experienced roughly 8 pound weight loss. She attributed it to decreased PO intake with the antibiotics. She has also been training for a half marathon, therefore, didn't think much of it.  In ED, she was hypoxic and was placed on venturi mask. 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 CTA chest was later obtained due to tachycardia, hypoxia, dyspnea and this was negative for PE.   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  Significant Hospital Events: Including procedures, antibiotic start and stop dates in addition to other pertinent events   6/13 admit. 6/14 pulm consult 6/15 CT-guided core biopsy of lung mass by IR.  MRSA PCR negative. 6/16 - Remains SOB. CXR stable. Anxiety after  talking to oncology precipitated some of her decline. Breathing treatments helping. Arranging for transfer to [place] today to start chemo.  -Oncology consultation differential diagnosis ovarian cancer [elevated CA125] over lung cancer with lymphangitic spread.  Carboplatin Taxol chemotherapy considered. 6/17 - 6/17 -now at [facility] long ICU.  On 14 L high flow nasal cannula.  Blood pressure fine.  Not on pressors.  Denies any pain. Afebrile. On Abx.  She has gotten some morphine for dyspnea and lorazepam for anxiety.  Thought to be anxious this morning by the hospitalist. On 12L [place]. Did not want morphine this AM for dyspnea.  STarting chemo today. Has PICC. On CAP antibiotics but no fever   Interim History / Subjective:   O2 weaned down to 6L [place] Using BiPAP at night Beginning to clear some yellow sputum. No blood.   Objective:  Blood pressure (!) 110/45, pulse 97, temperature 97.9 F (36.6 C), temperature source Oral, resp. rate 20, height '5\' 5"'$  (1.651 m), weight 71.2 kg, last menstrual period 11/22/2021, SpO2 97 %, unknown if currently breastfeeding.    FiO2 (%):  [50 %] 50 %   Intake/Output Summary (Last 24 hours) at 12/11/2021 0855 Last data filed at 12/11/2021 0400 Gross per 24 hour  Intake 1586.57 ml  Output 0 ml  Net 1586.57 ml    Filed Weights   12/06/21 2144  Weight: 71.2 kg    Examination: General:  Adult female in NAD Neuro:  Alert, oriented, non-focal. 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  CTA chest 6/14 > negative for  PE.  Assessment & Plan:   Acute hypoxic respiratory failure: secondary to multiple pulmonary metastases primary likely ovarian per oncology.   Advanced malignancy with bulky disease in the lung and lymphadenopathy, nodules, and lymphangitic spread, suspected ovarian cancer. Pathology shows poorly differentiated non-small cell without an obvious primary based on tumor markers  Plan - Supplemental O2 titrated to keep sats > 92%. Weaned down to 6L [place] today.  - BiPAP can be PRN - Ambulate - Continue nebs as scheduled - hopeful for continued improvement with ongoing chemotherapy.  - Incentive spirometry  PCCM will sign off  Georgann Housekeeper, AGACNP-BC [place] for personal pager PCCM on call pager [phone] until 7pm. Please call Elink 7p-7a. [phone]  12/11/2021 8:55 AM

## 2021-12-11 NOTE — Consult Note (Signed)
GYN Oncology Consultation   Melanie Ramirez 41 y.o. female  CC:  Chief Complaint  Patient presents with   Shortness of Breath    HPI: Melanie Ramirez is a 41 year old female who presented to the ER on 12/03/2021 with severe shortness of breath. Prior to this, she had been seen at an [place] practice for a non-productive cough and was started on Doxycycline and Levaquin with a chest xray revealing diffuse bilateral pulmonary infiltrates. Her symptoms did not improve and her PCP was in the process of ordered CT imaging. 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 A CT AP followed on 12/04/21 showing a 11 cm solid and cystic pelvic mass, multiple enlarged lymph nodes within the gastrohepatic ligament, cholelithiasis. A CT angio was also performed on 12/04/21 to rule out PE with findings negative for PE, encasement and narrowing of right upper lobe pulm artery by tumor, stable nodules, progressive consolidation.    On 12/05/21, she underwent CT guided biopsy of a right lower lung mass. Pathology returned on 12/10/21 as non-small cell carcinoma, poorly differentiated with necrosis with immunohistochemistry not suggesting a primary site with tumor exhibiting focal neuroendocrine differentiation.   Her past medical history includes anxiety, depression, recent treatment for pneumonia, headaches. Past surgical history includes D&E, dental surgery, foot surgery, and a ventral hernia repair. Her family history includes depression in multiple family members, paternal grandmother with breast and colon cancer, maternal aunt with breast cancer, maternal grandmother with colon cancer.  Interval History: She reports developing the shortness of breath at the end of May  2023. She has had an 8 pound weight loss. Periods are normal. No other symptoms prior to the development of shortness of breath. Denies known heart or lung issues. GYN history includes G3P2-vaginal births, children 50 and 12 years old. In college, she had an abnormal pap requiring a cone bx. The abnormal pap issue resolved and she has had normal paps since then. 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   Review of Systems:  See HPI: Additional review of systems negative  Current Meds: Current medications reviewed  Allergy:  Allergies  Allergen Reactions   Oxycodone     Had poor tolerance:unable to sleep &  mild hallucination    Social Hx:   Social History   Socioeconomic History   Marital status: Married    Spouse name: Not on file   Number of children: Not on file   Years of education: Not on file   Highest education level: Not on file  Occupational History   Not on file  Tobacco Use   Smoking status: Never   Smokeless tobacco: Never  Vaping Use   Vaping Use: Never used  Substance and Sexual Activity   Alcohol use: Yes    Alcohol/week: 1.0 standard drink of alcohol    Types: 1 Glasses of wine per week    Comment: weekly   Drug use: No   Sexual activity: Yes  Other Topics Concern   Not on file  Social History Narrative   Not on file   Social Determinants of Health   Financial Resource Strain: Not on file  Food Insecurity: Not on file  Transportation Needs: Not on file  Physical Activity: Not on file  Stress: Not on file  Social Connections: Not on  file  Intimate Partner Violence: Not on file    Past Surgical Hx:  Past Surgical History:  Procedure Laterality Date   DENTAL SURGERY     DILATION AND EVACUATION N/A 07/29/2013   Procedure: Ultrasound guided Suction DILATATION AND EVACUATION (D&E) 2ND TRIMESTER with tissue sent for chromosome analysis;  Surgeon: Lovenia Kim, MD;  Location: [facility];  Service: Gynecology;  Laterality: N/A;   FOOT SURGERY Left 2018    calcaneus   VENTRAL HERNIA REPAIR N/A 12/08/2018   Procedure: LAPAROSCOPIC VENTRAL WALL HERNIA REPAIR WITH MESH;  Surgeon: Michael Boston, MD;  Location: [facility];  Service: General;  Laterality: N/A;    Past Medical Hx:  Past Medical History:  Diagnosis Date   Active labor 03/21/2011   Anxiety    Depression    Headache(784.0)    occ. migraines with periods   Pneumonia    as teenager   Postpartum care following vaginal delivery (9/28) 03/21/2011    Family Hx:  Family History  Problem Relation Age of Onset   Depression Mother    Depression Father    Depression Sister    Depression Brother    Depression Daughter    Depression Son     Vitals:  Blood pressure 104/63, pulse 96, temperature 97.6 F (36.4 C), temperature source Oral, resp. rate 19, height '5\' 5"'$  (1.651 m), weight 156 lb 15.5 oz (71.2 kg), last menstrual period 11/22/2021, SpO2 96 %, unknown if currently breastfeeding.  Physical Exam:  Physical exam deferred since patient in the middle of hair cut in the chair. Labored breathing noted.  Assessment/Plan: 41 year old female currently admitted with evidence of metastatic disease on imaging. Primary unknown. See addendum per Dr. Berline Lopes.   Dorothyann Gibbs, NP 12/11/2021, 4:28 PM

## 2021-12-11 NOTE — Progress Notes (Signed)
Pt currently off BIPAP.

## 2021-12-12 DIAGNOSIS — F32A Depression, unspecified: Secondary | ICD-10-CM | POA: Diagnosis not present

## 2021-12-12 DIAGNOSIS — C7800 Secondary malignant neoplasm of unspecified lung: Secondary | ICD-10-CM | POA: Diagnosis not present

## 2021-12-12 DIAGNOSIS — F419 Anxiety disorder, unspecified: Secondary | ICD-10-CM | POA: Diagnosis not present

## 2021-12-12 DIAGNOSIS — J9601 Acute respiratory failure with hypoxia: Secondary | ICD-10-CM | POA: Diagnosis not present

## 2021-12-12 LAB — CBC
HCT: 35.1 % — ABNORMAL LOW (ref 36.0–46.0)
Hemoglobin: 12 g/dL (ref 12.0–15.0)
MCH: 30.2 pg (ref 26.0–34.0)
MCHC: 34.2 g/dL (ref 30.0–36.0)
MCV: 88.4 fL (ref 80.0–100.0)
Platelets: 286 K/uL (ref 150–400)
RBC: 3.97 MIL/uL (ref 3.87–5.11)
RDW: 11.4 % — ABNORMAL LOW (ref 11.5–15.5)
WBC: 6.8 K/uL (ref 4.0–10.5)
nRBC: 0 % (ref 0.0–0.2)

## 2021-12-12 LAB — BASIC METABOLIC PANEL WITH GFR
Anion gap: 7 (ref 5–15)
BUN: 18 mg/dL (ref 6–20)
CO2: 25 mmol/L (ref 22–32)
Calcium: 9.2 mg/dL (ref 8.9–10.3)
Chloride: 105 mmol/L (ref 98–111)
Creatinine, Ser: 0.6 mg/dL (ref 0.44–1.00)
GFR, Estimated: >60 mL/min
Glucose, Bld: 104 mg/dL — ABNORMAL HIGH (ref 70–99)
Potassium: 4.5 mmol/L (ref 3.5–5.1)
Sodium: 137 mmol/L (ref 135–145)

## 2021-12-12 LAB — PHOSPHORUS: Phosphorus: 2 mg/dL — ABNORMAL LOW (ref 2.5–4.6)

## 2021-12-12 LAB — MAGNESIUM: Magnesium: 2.1 mg/dL (ref 1.7–2.4)

## 2021-12-12 MED ORDER — SACCHAROMYCES BOULARDII 250 MG PO CAPS
250.0000 mg | ORAL_CAPSULE | Freq: Two times a day (BID) | ORAL | Status: DC
  Administered 2021-12-12 – 2021-12-14 (×4): 250 mg via ORAL
  Filled 2021-12-12 (×4): qty 1

## 2021-12-12 MED ORDER — SODIUM PHOSPHATES 45 MMOLE/15ML IV SOLN
15.0000 mmol | Freq: Once | INTRAVENOUS | Status: AC
  Administered 2021-12-12: 15 mmol via INTRAVENOUS
  Filled 2021-12-12: qty 5

## 2021-12-12 MED ORDER — K PHOS MONO-SOD PHOS DI & MONO 155-852-130 MG PO TABS
500.0000 mg | ORAL_TABLET | Freq: Once | ORAL | Status: AC
  Administered 2021-12-12: 500 mg via ORAL
  Filled 2021-12-12: qty 2

## 2021-12-12 NOTE — Discharge Instructions (Signed)

## 2021-12-12 NOTE — Progress Notes (Signed)
Pt seen and given scheduled nebulizer treatment which she tolerated well.  HR101, RR22, SPO2 95% on 2L [place].  No increased wob / respiratory distress noted or voiced by patient.  Bipap remains in room on standby but not indicated at this time.

## 2021-12-12 NOTE — Progress Notes (Signed)
[facility] PROGRESS NOTE    Progress Note  Melanie Ramirez  OHY:[id number] DOB: 1980-07-22 DOA: 12/03/2021 PCP: Lennie Odor, PA     Brief Narrative:    Melanie Ramirez is an 41 y.o. female past medical history significant for depression anxiety comes into the ED for shortness of breath, has been having a nonproductive cough went to the [place] walk-in clinic chest x-ray showed diffuse bilateral pulmonary infiltrates was started on Doxy and Levaquin, symptoms have persisted.  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  CT abdomen and pelvis showed a solid cystic mass which has replaced the left ovary measuring 7 x 11 x 9 and multiple enlarged lymph nodes within the gastropathic ligament consistent with metastatic disease.  Thought to have metastatic ovarian cancer and transferred to [facility] for initiation of chemotherapy 6/17.  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    Assessment/Plan:   Acute respiratory failure with hypoxia/possible postobstructive pneumonia likely due to metastatic ovarian cancer: -wean O2 as able, currently on 2.5L -Briefly required BiPAP, now only using as needed.  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 6/16: Transferred to [facility] long hospital to start chemotherapy for 6/17 -Dr. Alvy Bimler consult appreciated -Patient to be treated with carboplatin, etoposide and durvalumab -Also seen by GYN oncology, unclear at this point if patient's primary malignancy is GYN - low-dose morphine to help with respirations -ativan for  anxiety  Anxiety and depression - Ativan as needed, melatonin for sleep. -resume home meds  Possible postobstructive pneumonia leukocytosis: D/c abx -pro calcitonin low -NP swab negative  Hyponatremia:  -resolved  Hypophosphatemia -replete  Per oncology can be d/c'd home when O2 sats 2L.  DVT prophylaxis: lovenox Family Communication: none at bedside Status is: Inpatient Remains inpatient appropriate because: Acute respiratory failure with hypoxia probably due to metastatic ovarian cancer needing inpatient chemo    Code Status:     full      Medical Consultants:   PCCM Oncology GYN oncology IR   Subjective:    Continues to feel better.  Oxygen is down to 2.5 L.  She did ambulate in the hall today.  Objective:    Vitals:   12/12/21 1045 12/12/21 1132 12/12/21 1409 12/12/21 1500  BP:      Pulse:      Resp:      Temp:  98.4 F (36.9 C)  98.4 F (36.9 C)  TempSrc:  Oral  Oral  SpO2: 95%  94%   Weight:      Height:       SpO2: 94 % O2 Flow Rate (L/min): 2.5 L/min FiO2 (%): 50 %   Intake/Output Summary (Last 24 hours) at 12/12/2021 1909 Last data filed at 12/12/2021 0000 Gross per 24 hour  Intake 120 ml  Output --  Net 120 ml   Filed Weights   12/06/21 2144  Weight: 71.2 kg    Exam:   General exam: Alert, awake, oriented x 3 Respiratory system: Clear to auscultation. Respiratory effort normal. Cardiovascular system:RRR. No murmurs, rubs, gallops. Gastrointestinal system: Abdomen is nondistended, soft and nontender. No organomegaly or masses felt. Normal bowel sounds heard. Central nervous system: Alert and  oriented. No focal neurological deficits. Extremities: No C/C/E, +pedal pulses Skin: No rashes, lesions or ulcers Psychiatry: Judgement and insight appear normal. Mood & affect appropriate.        Data Reviewed:    Labs: Basic Metabolic Panel: Recent Labs  Lab 12/07/21 0335 12/08/21 0540 12/09/21 0550 12/10/21 0500  12/11/21 0524 12/12/21 0513  NA 137 140 138 136 137 137  K 4.3 4.3 4.3 4.6 4.1 4.5  CL 104 108 105 103 107 105  CO2 '26 24 25 26 23 25  '$ GLUCOSE 147* 117* 92 106* 96 104*  BUN 13 20 24* 21* 16 18  CREATININE 0.58 0.62 0.57 0.52 0.52 0.60  CALCIUM 8.9 8.9 8.6* 9.1 8.3* 9.2  MG 2.3  --   --   --   --  2.1  PHOS  --   --   --  1.6* 2.1* 2.0*   GFR Estimated Creatinine Clearance: 93.5 mL/min (by C-G formula based on SCr of 0.6 mg/dL). Liver Function Tests: Recent Labs  Lab 12/07/21 0335  AST 28  ALT 18  ALKPHOS 93  BILITOT 0.4  PROT 6.8  ALBUMIN 2.8*   No results for input(s): "LIPASE", "AMYLASE" in the last 168 hours.  No results for input(s): "AMMONIA" in the last 168 hours. Coagulation profile No results for input(s): "INR", "PROTIME" in the last 168 hours.  COVID-19 Labs  No results for input(s): "DDIMER", "FERRITIN", "LDH", "CRP" in the last 72 hours.   Lab Results  Component Value Date   SARSCOV2NAA NEGATIVE 12/05/2021   [place] Not Detected 06/15/2019   [place] Not Detected 01/12/2019   SARSCOV2NAA NOT DETECTED 12/04/2018    CBC: Recent Labs  Lab 12/07/21 0335 12/08/21 0540 12/09/21 0550 12/11/21 0524 12/12/21 0513  WBC 9.4 17.2* 10.2 6.2 6.8  NEUTROABS 7.7  --   --   --   --   HGB 11.5* 11.5* 11.3* 11.7* 12.0  HCT 33.4* 33.6* 32.8* 34.4* 35.1*  MCV 89.3 89.4 89.4 88.2 88.4  PLT 351 448* 356 281 286   Cardiac Enzymes: No results for input(s): "CKTOTAL", "CKMB", "CKMBINDEX", "TROPONINI" in the last 168 hours. BNP (last 3 results) No results for input(s): "PROBNP" in the last 8760 hours. CBG: Recent Labs  Lab 12/08/21 0002  GLUCAP 126*   D-Dimer: No results for input(s): "DDIMER" in the last 72 hours. Hgb A1c: No results for input(s): "HGBA1C" in the last 72 hours. Lipid Profile: No results for input(s): "CHOL", "HDL", "LDLCALC", "TRIG", "CHOLHDL", "LDLDIRECT" in the last 72 hours. Thyroid function studies: No results for input(s):  "TSH", "T4TOTAL", "T3FREE", "THYROIDAB" in the last 72 hours.  Invalid input(s): "FREET3" Anemia work up: No results for input(s): "VITAMINB12", "FOLATE", "FERRITIN", "TIBC", "IRON", "RETICCTPCT" in the last 72 hours. Sepsis Labs: Recent Labs  Lab 12/07/21 0344 12/08/21 0540 12/09/21 0550 12/11/21 0524 12/12/21 0513  PROCALCITON 0.10  --   --   --   --   WBC  --  17.2* 10.2 6.2 6.8   Microbiology Recent Results (from the past 240 hour(s))  MRSA Next Gen by PCR, Nasal     Status: None   Collection Time: 12/04/21  2:14 PM   Specimen: Nasal Mucosa; Nasal Swab  Result Value Ref Range Status   MRSA by PCR Next Gen NOT DETECTED NOT DETECTED Final    Comment: (NOTE) The GeneXpert MRSA Assay (FDA approved for NASAL specimens only), is one component of a comprehensive MRSA colonization surveillance program. It is not intended to diagnose MRSA  infection nor to guide or monitor treatment for MRSA infections. Test performance is not FDA approved in patients less than 64 years old. Performed at [facility], [place] [address], [place], [place] [address]   SARS Coronavirus 2 by RT PCR (hospital order, performed in [facility] hospital lab) *cepheid single result test* Anterior Nasal Swab     Status: None   Collection Time: 12/05/21  7:26 AM   Specimen: Anterior Nasal Swab  Result Value Ref Range Status   SARS Coronavirus 2 by RT PCR NEGATIVE NEGATIVE Final    Comment: (NOTE) SARS-CoV-2 target nucleic acids are NOT DETECTED.  The SARS-CoV-2 RNA is generally detectable in upper and lower respiratory specimens during the acute phase of infection. The lowest concentration of SARS-CoV-2 viral copies this assay can detect is 250 copies / mL. A negative result does not preclude SARS-CoV-2 infection and should not be used as the sole basis for treatment or other patient management decisions.  A negative result may occur with improper specimen collection / handling, submission of specimen  other than nasopharyngeal swab, presence of viral mutation(s) within the areas targeted by this assay, and inadequate number of viral copies (<250 copies / mL). A negative result must be combined with clinical observations, patient history, and epidemiological information.  Fact Sheet for Patients:   https://www.patel.info/  Fact Sheet for Healthcare Providers: https://hall.com/  This test is not yet approved or  cleared by the [place] FDA and has been authorized for detection and/or diagnosis of SARS-CoV-2 by FDA under an Emergency Use Authorization (EUA).  This EUA will remain in effect (meaning this test can be used) for the duration of the COVID-19 declaration under Section 564(b)(1) of the Act, 21 U.S.C. section 360bbb-3(b)(1), unless the authorization is terminated or revoked sooner.  Performed at [facility], [place] [address] [place], [place] [address]   Respiratory (~20 pathogens) panel by PCR     Status: None   Collection Time: 12/07/21 11:32 AM   Specimen: Nasopharyngeal Swab; Respiratory  Result Value Ref Range Status   Adenovirus NOT DETECTED NOT DETECTED Final   Coronavirus 229E NOT DETECTED NOT DETECTED Final    Comment: (NOTE) The Coronavirus on the Respiratory Panel, DOES NOT test for the novel  Coronavirus (2019 nCoV)    Coronavirus HKU1 NOT DETECTED NOT DETECTED Final   Coronavirus NL63 NOT DETECTED NOT DETECTED Final   Coronavirus OC43 NOT DETECTED NOT DETECTED Final   Metapneumovirus NOT DETECTED NOT DETECTED Final   Rhinovirus / Enterovirus NOT DETECTED NOT DETECTED Final   Influenza A NOT DETECTED NOT DETECTED Final   Influenza B NOT DETECTED NOT DETECTED Final   Parainfluenza Virus 1 NOT DETECTED NOT DETECTED Final   Parainfluenza Virus 2 NOT DETECTED NOT DETECTED Final   Parainfluenza Virus 3 NOT DETECTED NOT DETECTED Final   Parainfluenza Virus 4 NOT DETECTED NOT DETECTED Final   Respiratory  Syncytial Virus NOT DETECTED NOT DETECTED Final   Bordetella pertussis NOT DETECTED NOT DETECTED Final   Bordetella Parapertussis NOT DETECTED NOT DETECTED Final   Chlamydophila pneumoniae NOT DETECTED NOT DETECTED Final   Mycoplasma pneumoniae NOT DETECTED NOT DETECTED Final    Comment: Performed at [facility], [place]. [address] [place], [place] [address]     Medications:    buPROPion  300 mg Oral Daily   Chlorhexidine Gluconate Cloth  6 each Topical Daily   citalopram  40 mg Oral Daily   dextromethorphan-guaiFENesin  1 tablet Oral  BID   enoxaparin (LOVENOX) injection  40 mg Subcutaneous Q24H   feeding supplement  237 mL Oral BID BM   ipratropium  0.5 mg Nebulization TID   levalbuterol  0.63 mg Nebulization TID   melatonin  3 mg Oral QHS   saccharomyces boulardii  250 mg Oral BID   traZODone  50 mg Oral QHS   Continuous Infusions:  sodium chloride Stopped (12/10/21 0824)      LOS: 8 days   [employer]  [facility]  12/12/2021, 7:09 PM

## 2021-12-12 NOTE — Progress Notes (Signed)
Nutrition Follow-up  DOCUMENTATION CODES:   Not applicable  INTERVENTION:  - continue Ensure BID. - will add probiotic. - discussed current bowel regimen with MD. - will enter High Calorie, High Protein Nutrition Therapy handout from the Academy of Nutrition and Dietetics to AVS.    NUTRITION DIAGNOSIS:   Increased nutrient needs related to cancer and cancer related treatments as evidenced by estimated needs. -ongoing  GOAL:   Patient will meet greater than or equal to 90% of their needs -progressing  MONITOR:   PO intake, Supplement acceptance, Labs, Weight trends   ASSESSMENT:   41 y.o. female with medical history of depression and anxiety. She presented to the ED due to shortness of breath and non-productive cough. At [facility] walk-in clinic she had CXR done which showed diffuse bilateral pulmonary infiltrates. 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 CT abdomen/pelvis showed a solid cystic mass which has replaced the left ovary measuring 7 x 11 x 9 and multiple enlarged lymph nodes within the gastropathic ligament consistent with metastatic disease. She was admitted due to concern for metastatic ovarian cancer and transferred to [facility] for initiation of chemo.  No meal intakes documented since 6/16. Patient had first chemo treatment on 6/17.  Patient sitting up in bed with her husband at bedside. She shares that overall she has been feeling better. Her appetite is decreased from baseline. She is trying to eat as she is able and to focus on protein, as advised by Oncologist.   She shares that she had a chicken sandwich for lunch today. Family and friends have been bringing in snacks for her. She has not had nausea or discomfort with PO intakes.   She shares that she has been  having ongoing BMs that are often very loose. She is interested in probiotic being ordered.  Patient shares that PTA she had been training for a half marathon but d/t shortness of breath that began to develop ~1 month ago, she had to stop training. Today she was able to walk to the nurses' station and back. Patient reports feeling weak and has noticed changes since admission.  She was last weighed on 6/16 at which time weight was stable since 11/2018.   Labs reviewed; Phos: 2 mg/dl.  Medications reviewed; 100 mg colace/day, 3 mg melatonin/night, KPhos x1 dose 6/22.    NUTRITION - FOCUSED PHYSICAL EXAM:  Flowsheet Row Most Recent Value  Orbital Region No depletion  Upper Arm Region No depletion  Thoracic and Lumbar Region No depletion  Buccal Region No depletion  Temple Region No depletion  Clavicle Bone Region No depletion  Clavicle and Acromion Bone Region No depletion  Scapular Bone Region No depletion  Dorsal Hand No depletion  Patellar Region No depletion  Anterior Thigh Region No depletion  Posterior Calf Region No depletion  Edema (RD Assessment) None  Hair Reviewed  Eyes Reviewed  Mouth Reviewed  Skin Reviewed  Nails Reviewed       Diet Order:   Diet Order             Diet regular Room service appropriate? Yes; Fluid consistency: Thin  Diet effective now                   EDUCATION NEEDS:   No education needs have been identified at this time  Skin:  Skin  Assessment: Skin Integrity Issues: Skin Integrity Issues:: Incisions Incisions: R lumbar (6/15)  Last BM:  6/22 (type 6 x1, medium amount)  Height:   Ht Readings from Last 1 Encounters:  12/06/21 '5\' 5"'$  (1.651 m)    Weight:   Wt Readings from Last 1 Encounters:  12/06/21 71.2 kg     BMI:  Body mass index is 26.12 kg/m.  Estimated Nutritional Needs:  Kcal:  2150-2400 kcal Protein:  105-120 grams Fluid:  >/= 2.2 L/day     Jarome Matin, MS, RD, LDN, CNSC Registered Dietitian  II Inpatient Clinical Nutrition RD pager # and on-call/weekend pager # available in [facility]

## 2021-12-12 NOTE — Progress Notes (Signed)
Chaplain provided emotional and spiritual support to Melanie Ramirez and her mother as they shared about her recent unexpected diagnosis.  They have good support from family, friends and faith community and they are members at [employer].  Chaplain provided prayer at their request as well as listening.  [address] [place] Pager, [phone]

## 2021-12-13 ENCOUNTER — Other Ambulatory Visit: Payer: Self-pay | Admitting: Hematology and Oncology

## 2021-12-13 DIAGNOSIS — J9601 Acute respiratory failure with hypoxia: Secondary | ICD-10-CM | POA: Diagnosis not present

## 2021-12-13 DIAGNOSIS — F32A Depression, unspecified: Secondary | ICD-10-CM | POA: Diagnosis not present

## 2021-12-13 DIAGNOSIS — C7802 Secondary malignant neoplasm of left lung: Secondary | ICD-10-CM

## 2021-12-13 DIAGNOSIS — F419 Anxiety disorder, unspecified: Secondary | ICD-10-CM | POA: Diagnosis not present

## 2021-12-13 DIAGNOSIS — C7801 Secondary malignant neoplasm of right lung: Secondary | ICD-10-CM

## 2021-12-13 DIAGNOSIS — C7800 Secondary malignant neoplasm of unspecified lung: Secondary | ICD-10-CM | POA: Diagnosis not present

## 2021-12-13 MED ORDER — NORMAL SALINE FLUSH 0.9 % IV SOLN: INTRAVENOUS | 3 refills | Status: DC

## 2021-12-13 MED ORDER — ALLOPURINOL 300 MG PO TABS: 300.0000 mg | ORAL_TABLET | Freq: Every day | ORAL | 1 refills | Status: DC

## 2021-12-13 MED ORDER — ONDANSETRON HCL 8 MG PO TABS: 8.0000 mg | ORAL_TABLET | Freq: Three times a day (TID) | ORAL | 3 refills | Status: AC | PRN

## 2021-12-13 MED ORDER — PROCHLORPERAZINE MALEATE 10 MG PO TABS: 10.0000 mg | ORAL_TABLET | Freq: Four times a day (QID) | ORAL | 0 refills | Status: AC | PRN

## 2021-12-14 ENCOUNTER — Other Ambulatory Visit: Payer: Self-pay | Admitting: Hematology and Oncology

## 2021-12-14 DIAGNOSIS — C7801 Secondary malignant neoplasm of right lung: Secondary | ICD-10-CM | POA: Diagnosis not present

## 2021-12-14 DIAGNOSIS — J189 Pneumonia, unspecified organism: Secondary | ICD-10-CM | POA: Diagnosis not present

## 2021-12-14 DIAGNOSIS — J9601 Acute respiratory failure with hypoxia: Secondary | ICD-10-CM | POA: Diagnosis not present

## 2021-12-14 DIAGNOSIS — C7800 Secondary malignant neoplasm of unspecified lung: Secondary | ICD-10-CM | POA: Diagnosis not present

## 2021-12-14 DIAGNOSIS — C7802 Secondary malignant neoplasm of left lung: Secondary | ICD-10-CM

## 2021-12-14 MED ORDER — DM-GUAIFENESIN ER 30-600 MG PO TB12: 1.0000 | ORAL_TABLET | Freq: Two times a day (BID) | ORAL | 0 refills | Status: AC

## 2021-12-14 MED ORDER — LORAZEPAM 0.5 MG PO TABS: 0.5000 mg | ORAL_TABLET | Freq: Two times a day (BID) | ORAL | 0 refills | Status: DC | PRN

## 2021-12-14 MED ORDER — TRAZODONE HCL 50 MG PO TABS: 50.0000 mg | ORAL_TABLET | Freq: Every day | ORAL | 0 refills | Status: AC

## 2021-12-14 MED ORDER — ALBUTEROL SULFATE HFA 108 (90 BASE) MCG/ACT IN AERS: 2.0000 | INHALATION_SPRAY | RESPIRATORY_TRACT | 0 refills | Status: AC | PRN

## 2021-12-14 MED ORDER — HEPARIN SOD (PORK) LOCK FLUSH 100 UNIT/ML IV SOLN
250.0000 [IU] | INTRAVENOUS | Status: AC | PRN
Start: 2021-12-14 — End: 2021-12-14
  Administered 2021-12-14: 250 [IU]

## 2021-12-14 MED ORDER — LEVALBUTEROL HCL 0.63 MG/3ML IN NEBU: 0.6300 mg | INHALATION_SOLUTION | RESPIRATORY_TRACT | 12 refills | Status: AC | PRN

## 2021-12-16 ENCOUNTER — Encounter: Payer: Self-pay | Admitting: General Practice

## 2021-12-19 ENCOUNTER — Other Ambulatory Visit: Payer: Self-pay | Admitting: Hematology and Oncology

## 2021-12-19 DIAGNOSIS — C801 Malignant (primary) neoplasm, unspecified: Secondary | ICD-10-CM

## 2021-12-19 DIAGNOSIS — C7801 Secondary malignant neoplasm of right lung: Secondary | ICD-10-CM

## 2021-12-20 ENCOUNTER — Inpatient Hospital Stay: Payer: BC Managed Care – PPO

## 2021-12-20 ENCOUNTER — Other Ambulatory Visit (HOSPITAL_COMMUNITY): Payer: Self-pay

## 2021-12-20 ENCOUNTER — Telehealth: Payer: Self-pay

## 2021-12-20 ENCOUNTER — Other Ambulatory Visit: Payer: Self-pay

## 2021-12-20 ENCOUNTER — Encounter: Payer: Self-pay | Admitting: Hematology and Oncology

## 2021-12-20 ENCOUNTER — Inpatient Hospital Stay: Payer: BC Managed Care – PPO | Attending: Hematology and Oncology | Admitting: Hematology and Oncology

## 2021-12-20 VITALS — BP 101/53 | HR 102 | Temp 99.0°F | Resp 18 | Ht 65.0 in | Wt 149.0 lb

## 2021-12-20 DIAGNOSIS — C7802 Secondary malignant neoplasm of left lung: Secondary | ICD-10-CM | POA: Diagnosis present

## 2021-12-20 DIAGNOSIS — Z7189 Other specified counseling: Secondary | ICD-10-CM | POA: Insufficient documentation

## 2021-12-20 DIAGNOSIS — Z79899 Other long term (current) drug therapy: Secondary | ICD-10-CM | POA: Diagnosis not present

## 2021-12-20 DIAGNOSIS — J9601 Acute respiratory failure with hypoxia: Secondary | ICD-10-CM | POA: Diagnosis not present

## 2021-12-20 DIAGNOSIS — C801 Malignant (primary) neoplasm, unspecified: Secondary | ICD-10-CM | POA: Insufficient documentation

## 2021-12-20 DIAGNOSIS — R059 Cough, unspecified: Secondary | ICD-10-CM | POA: Insufficient documentation

## 2021-12-20 DIAGNOSIS — D61818 Other pancytopenia: Secondary | ICD-10-CM | POA: Diagnosis not present

## 2021-12-20 DIAGNOSIS — C7800 Secondary malignant neoplasm of unspecified lung: Secondary | ICD-10-CM

## 2021-12-20 DIAGNOSIS — C7801 Secondary malignant neoplasm of right lung: Secondary | ICD-10-CM

## 2021-12-20 LAB — CBC WITH DIFFERENTIAL (CANCER CENTER ONLY)
Abs Immature Granulocytes: 0.1 10*3/uL — ABNORMAL HIGH (ref 0.00–0.07)
Basophils Absolute: 0 10*3/uL (ref 0.0–0.1)
Basophils Relative: 1 %
Eosinophils Absolute: 0.1 10*3/uL (ref 0.0–0.5)
Eosinophils Relative: 3 %
HCT: 32.7 % — ABNORMAL LOW (ref 36.0–46.0)
Hemoglobin: 11.3 g/dL — ABNORMAL LOW (ref 12.0–15.0)
Immature Granulocytes: 2 %
Lymphocytes Relative: 29 %
Lymphs Abs: 1.3 10*3/uL (ref 0.7–4.0)
MCH: 30.4 pg (ref 26.0–34.0)
MCHC: 34.6 g/dL (ref 30.0–36.0)
MCV: 87.9 fL (ref 80.0–100.0)
Monocytes Absolute: 1.3 10*3/uL — ABNORMAL HIGH (ref 0.1–1.0)
Monocytes Relative: 30 %
Neutro Abs: 1.6 10*3/uL — ABNORMAL LOW (ref 1.7–7.7)
Neutrophils Relative %: 35 %
Platelet Count: 266 10*3/uL (ref 150–400)
RBC: 3.72 MIL/uL — ABNORMAL LOW (ref 3.87–5.11)
RDW: 12.3 % (ref 11.5–15.5)
WBC Count: 4.5 10*3/uL (ref 4.0–10.5)
nRBC: 0 % (ref 0.0–0.2)

## 2021-12-20 LAB — CMP (CANCER CENTER ONLY)
ALT: 26 U/L (ref 0–44)
AST: 24 U/L (ref 15–41)
Albumin: 3.8 g/dL (ref 3.5–5.0)
Alkaline Phosphatase: 102 U/L (ref 38–126)
Anion gap: 6 (ref 5–15)
BUN: 19 mg/dL (ref 6–20)
CO2: 25 mmol/L (ref 22–32)
Calcium: 8.9 mg/dL (ref 8.9–10.3)
Chloride: 103 mmol/L (ref 98–111)
Creatinine: 0.68 mg/dL (ref 0.44–1.00)
GFR, Estimated: >60 mL/min (ref >60)
Glucose, Bld: 130 mg/dL — ABNORMAL HIGH (ref 70–99)
Potassium: 3.7 mmol/L (ref 3.5–5.1)
Sodium: 134 mmol/L — ABNORMAL LOW (ref 135–145)
Total Bilirubin: 0.2 mg/dL — ABNORMAL LOW (ref 0.3–1.2)
Total Protein: 7.3 g/dL (ref 6.5–8.1)

## 2021-12-20 LAB — SAMPLE TO BLOOD BANK

## 2021-12-20 LAB — TSH: TSH: 1.673 u[IU]/mL (ref 0.350–4.500)

## 2021-12-20 MED ORDER — SODIUM CHLORIDE 0.9% FLUSH
10.0000 mL | Freq: Once | INTRAVENOUS | Status: AC
  Administered 2021-12-20: 10 mL

## 2021-12-20 MED ORDER — NORMAL SALINE FLUSH 0.9 % IV SOLN
10.0000 mL | Freq: Every day | INTRAVENOUS | 3 refills | Status: AC
  Filled 2021-12-20: qty 300, 30d supply, fill #0

## 2021-12-20 MED ORDER — HEPARIN SOD (PORK) LOCK FLUSH 100 UNIT/ML IV SOLN
500.0000 [IU] | Freq: Once | INTRAVENOUS | Status: AC
  Administered 2021-12-20: 500 [IU]

## 2021-12-20 NOTE — Assessment & Plan Note (Signed)
We discussed the role of surgery in the future We discussed coping strategies She would like resources and we will refer her to see social worker

## 2021-12-20 NOTE — Assessment & Plan Note (Signed)
She does not need transfusion support Monitor closely

## 2021-12-20 NOTE — Assessment & Plan Note (Signed)
Her oxygen saturation is normal on room air She does not need oxygen therapy

## 2021-12-20 NOTE — Telephone Encounter (Signed)
Notified Patient of completion of Disability Attending 82 Statement for [employer]. Fax transmission confirmation received. Copy of Form placed for pick-up as requested by Patient. No other needs or concerns voiced at this time.

## 2021-12-20 NOTE — Progress Notes (Signed)
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  She is off oxygen therapy and appears to be tolerating treatment well We discussed the rationale of switching her treatment to manage small cell lung cancer regimen We discussed risk, benefits, side effects of treatment and she is in agreement to proceed I plan to repeat imaging study after cycle 3 of treatment around mid August She will attend chemo education class Due to her PICC line situation, she will get PICC line dressing changes here weekly.  She will continue PICC line flushes at home daily.  Acute respiratory failure with hypoxia (HCC) Her oxygen saturation is normal on room air She does not need oxygen therapy  Pancytopenia, acquired ([place]) She does not need transfusion support Monitor closely  Goals of care, counseling/discussion We discussed the role of surgery in the future We discussed coping strategies She would like resources and we will refer her to see social worker  Orders Placed This Encounter  Procedures   Sample to Blood Bank    Standing Status:   Standing    Number of Occurrences:   33    Standing Expiration Date:   12/21/2022    All questions were answered. The patient knows to call the clinic with any problems, questions or concerns. The total time  spent in the appointment was 40 minutes encounter with patients including review of chart and various tests results, discussions about plan of care and coordination of care plan   Heath Lark, MD 12/20/2021 3:58 PM  INTERVAL HISTORY: Please see below for problem oriented charting. she returns for treatment follow-up with her husband She is doing well She has occasional cough but nothing changes She had frequent small bowel movement She is eating better and gaining weight She have lots of questions related to future follow-up and plan of care  REVIEW OF SYSTEMS:   Constitutional: Denies fevers, chills or abnormal weight loss Eyes: Denies blurriness of vision Ears, nose, mouth, throat, and face: Denies mucositis or sore throat Cardiovascular: Denies palpitation, chest discomfort or lower extremity swelling Gastrointestinal:  Denies nausea, heartburn or change in bowel habits Skin: Denies abnormal skin rashes Lymphatics: Denies new lymphadenopathy or easy bruising Neurological:Denies numbness, tingling or new weaknesses Behavioral/Psych: Mood is stable, no new changes  All other systems were reviewed with the patient and are negative.  I have reviewed the past medical history, past surgical history, social history and family history with the patient and they are unchanged from previous note.  ALLERGIES:  is allergic to oxycodone.  MEDICATIONS:  Current Outpatient Medications  Medication Sig Dispense Refill   Sodium Chloride Flush (NORMAL SALINE FLUSH) 0.9 % SOLN Inject 10 mLs into the vein daily. 300 mL 3   acetaminophen (TYLENOL) 500 MG tablet Take 1,000 mg by mouth every 6 (six) hours as needed for moderate pain or headache.     albuterol (VENTOLIN  HFA) 108 (90 Base) MCG/ACT inhaler Inhale 2 puffs into the lungs every 4 (four) hours as needed for wheezing or shortness of breath. 18 g 0   allopurinol (ZYLOPRIM) 300 MG tablet Take 1 tablet (300 mg total) by mouth daily. 30 tablet 1    buPROPion (WELLBUTRIN XL) 300 MG 24 hr tablet Take 300 mg by mouth daily.     citalopram (CELEXA) 40 MG tablet Take 40 mg by mouth daily.      dextromethorphan-guaiFENesin (MUCINEX DM) 30-600 MG 12hr tablet Take 1 tablet by mouth 2 (two) times daily. 30 tablet 0   ibuprofen (ADVIL) 200 MG tablet Take 800 mg by mouth every 6 (six) hours as needed for headache, mild pain or fever.     levalbuterol (XOPENEX) 0.63 MG/3ML nebulizer solution Take 3 mLs (0.63 mg total) by nebulization every 4 (four) hours as needed for wheezing or shortness of breath. 3 mL 12   LORazepam (ATIVAN) 0.5 MG tablet Take 1 tablet (0.5 mg total) by mouth 2 (two) times daily as needed for anxiety. 15 tablet 0   Magnesium 300 MG CAPS Take 300 mg by mouth every evening.     Multiple Vitamins-Minerals (MULTIVITAMIN WITH MINERALS) tablet Take 1 tablet by mouth daily.     ondansetron (ZOFRAN) 8 MG tablet Take 1 tablet (8 mg total) by mouth every 8 (eight) hours as needed for nausea. 30 tablet 3   prochlorperazine (COMPAZINE) 10 MG tablet Take 1 tablet (10 mg total) by mouth every 6 (six) hours as needed for nausea or vomiting. 30 tablet 0   Sodium Chloride Flush (NORMAL SALINE FLUSH) 0.9 % SOLN Please flush 10 ml normal saline daily 30 mL 3   traZODone (DESYREL) 50 MG tablet Take 1 tablet (50 mg total) by mouth at bedtime. 30 tablet 0   No current facility-administered medications for this visit.    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 Negative for acute PE or thoracic aortic dissection. 2. Encasement and narrowing of the right upper lobe pulmonary artery by tumor. 3. Stable bilateral pulmonary nodules, hilar and mediastinal adenopathy. 4. Progressive consolidation at the right lung base   12/04/2021 Imaging   1. A mixed solid and cystic mass replaces the left ovary measuring roughly 6.8 x 11.0 x 9.2 cm in greatest dimension. This may  represent a primary ovarian mass or an ovarian metastasis. 2. Multiple pathologically enlarged lymph nodes within the gastrohepatic ligament, consistent with metastatic disease. 3. Innumerable pulmonary masses within the visualized lung bases bilaterally, better evaluated on previously performed dedicated CT imaging of the chest. 4. Small left pleural effusion. 5. Cholelithiasis without pericholecystic inflammatory change.     12/05/2021 Pathology Results   LUNG, RIGHT LOWER LOBE, NEEDLE CORE BIOPSY:  Non-small cell carcinoma, poorly differentiated with necrosis (see comment)   COMMENT:   Sections show multiple cores of pulmonary parenchyma multifocally infiltrated by sheets and nests of large, atypical cells with variably large round to oval nuclei and scattered small inconspicuous nucleoli.  There are foci of geographic tumor necrosis.  In areas focally the tumor shows features suggestive of a squamous differentiation. Sixteen immunohistochemical stains are performed with adequate control.  The  tumor is positive for pancytokeratin (AE1/AE3).  The tumor also shows patchy positivity for high molecular weight cytokeratin 5/6.  The tumor is negative for cytokeratin 7 and cytokeratin 20.  The tumor is negative for the squamous markers p40  and p63.  The tumor is negative for the pulmonary adeno markers Napsin A and TTF-1.  The tumor is negative for the GYN and renal marker PAX8.  The tumor is negative for the GYN and mesothelial markers WT1 the tumor shows patchy positivity for the neuroendocrine markers synaptophysin and CD56 the tumor also shows variable positivity for p53.  The tumor shows variable strong positivity for p16.  The tumor is negative for estrogen receptor and progesterone receptor.   In summary the cores show a poorly differentiated non-small cell carcinoma with necrosis exhibiting focal neuroendocrine differentiation by immunohistochemistry.  This immunohistochemical pattern does not   suggest a primary for this tumor.  The cytokeratin 5/6 positivity, although commonly used for squamous differentiation within pulmonary tumors, may be seen in high-grade ovarian tumors particularly serous  carcinomas.  Clinically the patient is reported to have a large ovarian mass with multiple pulmonary nodules and extensive abdominal and mediastinal adenopathy.  Given this history, the p16 and p53 positivity  may be seen in ovarian tumors, but they may also be seen in tumors from other sites.  Also the tumor is negative for the more specific gynecologic markers cytokeratin 7, PAX8 and WT1.  Additional clinical  and radiologic correlation is recommended and an ovarian mass biopsy is suggested.  With additional tissue molecular testing would be possible if needed.    12/05/2021 Procedure   Successful CT-guided core needle biopsy of right lower lung pulmonary nodule/mass.    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 [facility])   12/07/2021 - 12/07/2021 Chemotherapy   Patient is on Treatment Plan : OVARIAN Carboplatin (AUC 6) / Paclitaxel (175) q21d x 6 cycles     12/30/2021 -  Chemotherapy   Patient is on Treatment Plan : LUNG SCLC Carboplatin + Etoposide + Atezolizumab Induction q21d / Atezolizumab Maintenance q21d       PHYSICAL EXAMINATION: ECOG PERFORMANCE STATUS: 1 - Symptomatic but completely ambulatory  Vitals:   12/20/21 1411  BP: (!) 101/53  Pulse: (!) 102  Resp: 18  Temp: 99 F (37.2 C)  SpO2: 98%   Filed Weights   12/20/21 1411  Weight: 149 lb (67.6 kg)     GENERAL:alert, no distress and comfortable SKIN: skin color, texture, turgor are normal, no rashes or significant lesions EYES: normal, Conjunctiva are pink and non-injected, sclera clear OROPHARYNX:no exudate, no erythema and lips, buccal mucosa, and tongue normal  NECK: supple, thyroid normal size, non-tender, without nodularity LYMPH:  no palpable lymphadenopathy in the cervical, axillary or inguinal LUNGS: clear to auscultation and percussion with normal breathing effort HEART: regular rate & rhythm and no murmurs and no lower extremity edema ABDOMEN:abdomen soft, non-tender and normal bowel sounds Musculoskeletal:no cyanosis of digits and no clubbing  NEURO: alert & oriented x 3 with fluent speech, no focal motor/sensory deficits  LABORATORY DATA:  I have reviewed the data as listed    Component Value Date/Time   NA 137 12/12/2021 0513   K 4.5 12/12/2021 0513   CL 105 12/12/2021 0513   CO2 25 12/12/2021 0513  GLUCOSE 104 (H) 12/12/2021 0513   BUN 18 12/12/2021 0513   CREATININE 0.60 12/12/2021 0513   CALCIUM 9.2 12/12/2021 0513   PROT 6.8 12/07/2021 0335   ALBUMIN 2.8 (L) 12/07/2021 0335   AST 28 12/07/2021 0335   ALT 18 12/07/2021 0335   ALKPHOS 93 12/07/2021 0335   BILITOT 0.4 12/07/2021 0335   GFRNONAA >60 12/12/2021 0513    No results found for: "SPEP", "UPEP"  Lab Results  Component Value Date   WBC 4.5 12/20/2021   NEUTROABS 1.6 (L) 12/20/2021   HGB 11.3 (L) 12/20/2021   HCT 32.7 (L) 12/20/2021   MCV 87.9 12/20/2021   PLT 266 12/20/2021      Chemistry      Component Value Date/Time   NA 137 12/12/2021 0513   K 4.5 12/12/2021 0513   CL 105 12/12/2021 0513   CO2 25 12/12/2021 0513   BUN 18 12/12/2021 0513   CREATININE 0.60 12/12/2021 0513      Component Value Date/Time   CALCIUM 9.2 12/12/2021 0513   ALKPHOS 93 12/07/2021 0335   AST 28 12/07/2021 0335   ALT 18 12/07/2021 0335   BILITOT 0.4 12/07/2021 0335

## 2021-12-20 NOTE — Assessment & Plan Note (Signed)
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  She is off oxygen therapy and appears to be tolerating treatment well We discussed the rationale of switching her treatment to manage small cell lung cancer regimen We discussed risk, benefits, side effects of treatment and she is in agreement to proceed I plan to repeat imaging study after cycle 3 of treatment around mid August She will attend chemo education class Due to her PICC line situation, she will get PICC line dressing changes here weekly.  She will continue PICC line flushes at home daily.

## 2021-12-20 NOTE — Progress Notes (Signed)
Pharmacist Chemotherapy Monitoring - Initial Assessment    Anticipated start date: 12/30/21   The following has been reviewed per standard work regarding the patient's treatment regimen: The patient's diagnosis, treatment plan and drug doses, and organ/hematologic function Lab orders and baseline tests specific to treatment regimen  The treatment plan start date, drug sequencing, and pre-medications Prior authorization status  Patient's documented medication list, including drug-drug interaction screen and prescriptions for anti-emetics and supportive care specific to the treatment regimen The drug concentrations, fluid compatibility, administration routes, and timing of the medications to be used The patient's access for treatment and lifetime cumulative dose history, if applicable  The patient's medication allergies and previous infusion related reactions, if applicable   Changes made to treatment plan:  N/A  Follow up needed:  Pending authorization for treatment    Judge Stall, [facility], 12/20/2021  4:16 PM

## 2021-12-21 LAB — T4: T4, Total: 7.7 ug/dL (ref 4.5–12.0)

## 2021-12-23 ENCOUNTER — Telehealth: Payer: Self-pay | Admitting: Genetic Counselor

## 2021-12-23 ENCOUNTER — Other Ambulatory Visit: Payer: Self-pay | Admitting: Genetic Counselor

## 2021-12-23 ENCOUNTER — Telehealth: Payer: Self-pay

## 2021-12-23 DIAGNOSIS — C7801 Secondary malignant neoplasm of right lung: Secondary | ICD-10-CM

## 2021-12-23 NOTE — Telephone Encounter (Signed)
Called patient to schedule genetics appt for 7/14 at 10am.

## 2021-12-23 NOTE — Telephone Encounter (Signed)
CSW phoned patient to assess needs.  She stated she wanted assistance with her seven year hold son who is having difficulty processing patient's illness.  She has Melanie information for [employer].  Melanie Ramirez goes by Melanie Ramirez.  She is currently on disability for six months from work, and will then receive long term disability.  Melanie Ramirez declined financial resources at this time.  Provided Festus Holts phone number for follow up.

## 2021-12-25 ENCOUNTER — Inpatient Hospital Stay: Payer: BC Managed Care – PPO | Admitting: Licensed Clinical Social Worker

## 2021-12-25 DIAGNOSIS — Z5112 Encounter for antineoplastic immunotherapy: Secondary | ICD-10-CM | POA: Insufficient documentation

## 2021-12-25 DIAGNOSIS — C7931 Secondary malignant neoplasm of brain: Secondary | ICD-10-CM | POA: Insufficient documentation

## 2021-12-25 DIAGNOSIS — R110 Nausea: Secondary | ICD-10-CM | POA: Insufficient documentation

## 2021-12-25 DIAGNOSIS — Z7952 Long term (current) use of systemic steroids: Secondary | ICD-10-CM | POA: Insufficient documentation

## 2021-12-25 DIAGNOSIS — Z5189 Encounter for other specified aftercare: Secondary | ICD-10-CM | POA: Insufficient documentation

## 2021-12-25 DIAGNOSIS — Z452 Encounter for adjustment and management of vascular access device: Secondary | ICD-10-CM | POA: Insufficient documentation

## 2021-12-25 DIAGNOSIS — C7802 Secondary malignant neoplasm of left lung: Secondary | ICD-10-CM | POA: Insufficient documentation

## 2021-12-25 DIAGNOSIS — Z510 Encounter for antineoplastic radiation therapy: Secondary | ICD-10-CM | POA: Insufficient documentation

## 2021-12-25 DIAGNOSIS — E860 Dehydration: Secondary | ICD-10-CM | POA: Insufficient documentation

## 2021-12-25 DIAGNOSIS — Z5111 Encounter for antineoplastic chemotherapy: Secondary | ICD-10-CM | POA: Insufficient documentation

## 2021-12-25 DIAGNOSIS — R634 Abnormal weight loss: Secondary | ICD-10-CM | POA: Insufficient documentation

## 2021-12-25 DIAGNOSIS — C7801 Secondary malignant neoplasm of right lung: Secondary | ICD-10-CM | POA: Insufficient documentation

## 2021-12-25 DIAGNOSIS — R059 Cough, unspecified: Secondary | ICD-10-CM | POA: Insufficient documentation

## 2021-12-25 DIAGNOSIS — R42 Dizziness and giddiness: Secondary | ICD-10-CM | POA: Insufficient documentation

## 2021-12-25 DIAGNOSIS — C801 Malignant (primary) neoplasm, unspecified: Secondary | ICD-10-CM | POA: Insufficient documentation

## 2021-12-25 DIAGNOSIS — R031 Nonspecific low blood-pressure reading: Secondary | ICD-10-CM | POA: Insufficient documentation

## 2021-12-25 DIAGNOSIS — R0602 Shortness of breath: Secondary | ICD-10-CM | POA: Insufficient documentation

## 2021-12-25 DIAGNOSIS — R531 Weakness: Secondary | ICD-10-CM | POA: Insufficient documentation

## 2021-12-25 NOTE — Progress Notes (Signed)
[facility] CSW Progress Note  Clinical Social Worker contacted patient by phone to follow-up on resources to support her daughter. Pt has two daughters Festus Holts- [place]- 7) and pt is focused on supporting them, especially Lilly who is having a difficult time with pt's diagnosis. CSW confirmed information on [employer] and shared information on [employer] and [place] Programming through the cancer center. Also sent information on other local therapists per pt's request.  No other needs at this time. CSW encouraged pt to reach out with any questions.    Khila Papp E Damire Remedios, LCSW

## 2021-12-27 ENCOUNTER — Other Ambulatory Visit: Payer: Self-pay | Admitting: Hematology and Oncology

## 2021-12-27 ENCOUNTER — Inpatient Hospital Stay (HOSPITAL_BASED_OUTPATIENT_CLINIC_OR_DEPARTMENT_OTHER): Payer: BC Managed Care – PPO | Admitting: Hematology and Oncology

## 2021-12-27 ENCOUNTER — Encounter: Payer: Self-pay | Admitting: Hematology and Oncology

## 2021-12-27 ENCOUNTER — Inpatient Hospital Stay: Payer: BC Managed Care – PPO

## 2021-12-27 ENCOUNTER — Other Ambulatory Visit: Payer: Self-pay

## 2021-12-27 DIAGNOSIS — I950 Idiopathic hypotension: Secondary | ICD-10-CM | POA: Diagnosis not present

## 2021-12-27 DIAGNOSIS — Z510 Encounter for antineoplastic radiation therapy: Secondary | ICD-10-CM | POA: Diagnosis present

## 2021-12-27 DIAGNOSIS — C7800 Secondary malignant neoplasm of unspecified lung: Secondary | ICD-10-CM | POA: Diagnosis not present

## 2021-12-27 DIAGNOSIS — C7802 Secondary malignant neoplasm of left lung: Secondary | ICD-10-CM

## 2021-12-27 DIAGNOSIS — C7801 Secondary malignant neoplasm of right lung: Secondary | ICD-10-CM | POA: Diagnosis not present

## 2021-12-27 DIAGNOSIS — C801 Malignant (primary) neoplasm, unspecified: Secondary | ICD-10-CM

## 2021-12-27 DIAGNOSIS — R031 Nonspecific low blood-pressure reading: Secondary | ICD-10-CM | POA: Diagnosis not present

## 2021-12-27 DIAGNOSIS — Z5112 Encounter for antineoplastic immunotherapy: Secondary | ICD-10-CM | POA: Diagnosis present

## 2021-12-27 DIAGNOSIS — Z5189 Encounter for other specified aftercare: Secondary | ICD-10-CM | POA: Diagnosis not present

## 2021-12-27 DIAGNOSIS — I959 Hypotension, unspecified: Secondary | ICD-10-CM | POA: Diagnosis not present

## 2021-12-27 DIAGNOSIS — R531 Weakness: Secondary | ICD-10-CM | POA: Diagnosis not present

## 2021-12-27 DIAGNOSIS — Z7952 Long term (current) use of systemic steroids: Secondary | ICD-10-CM | POA: Diagnosis not present

## 2021-12-27 DIAGNOSIS — C7931 Secondary malignant neoplasm of brain: Secondary | ICD-10-CM | POA: Diagnosis present

## 2021-12-27 DIAGNOSIS — E860 Dehydration: Secondary | ICD-10-CM | POA: Diagnosis not present

## 2021-12-27 DIAGNOSIS — R0602 Shortness of breath: Secondary | ICD-10-CM | POA: Diagnosis not present

## 2021-12-27 LAB — CBC WITH DIFFERENTIAL (CANCER CENTER ONLY)
Abs Immature Granulocytes: 0.21 10*3/uL — ABNORMAL HIGH (ref 0.00–0.07)
Basophils Absolute: 0.1 10*3/uL (ref 0.0–0.1)
Basophils Relative: 1 %
Eosinophils Absolute: 0.1 10*3/uL (ref 0.0–0.5)
Eosinophils Relative: 1 %
HCT: 34.7 % — ABNORMAL LOW (ref 36.0–46.0)
Hemoglobin: 12.3 g/dL (ref 12.0–15.0)
Immature Granulocytes: 3 %
Lymphocytes Relative: 22 %
Lymphs Abs: 1.4 10*3/uL (ref 0.7–4.0)
MCH: 31 pg (ref 26.0–34.0)
MCHC: 35.4 g/dL (ref 30.0–36.0)
MCV: 87.4 fL (ref 80.0–100.0)
Monocytes Absolute: 0.9 10*3/uL (ref 0.1–1.0)
Monocytes Relative: 14 %
Neutro Abs: 4 10*3/uL (ref 1.7–7.7)
Neutrophils Relative %: 59 %
Platelet Count: 234 10*3/uL (ref 150–400)
RBC: 3.97 MIL/uL (ref 3.87–5.11)
RDW: 13.4 % (ref 11.5–15.5)
WBC Count: 6.6 10*3/uL (ref 4.0–10.5)
nRBC: 0 % (ref 0.0–0.2)

## 2021-12-27 LAB — CMP (CANCER CENTER ONLY)
ALT: 14 U/L (ref 0–44)
AST: 24 U/L (ref 15–41)
Albumin: 3.8 g/dL (ref 3.5–5.0)
Alkaline Phosphatase: 105 U/L (ref 38–126)
Anion gap: 9 (ref 5–15)
BUN: 16 mg/dL (ref 6–20)
CO2: 25 mmol/L (ref 22–32)
Calcium: 9.5 mg/dL (ref 8.9–10.3)
Chloride: 103 mmol/L (ref 98–111)
Creatinine: 0.61 mg/dL (ref 0.44–1.00)
GFR, Estimated: >60 mL/min (ref >60)
Glucose, Bld: 159 mg/dL — ABNORMAL HIGH (ref 70–99)
Potassium: 3.5 mmol/L (ref 3.5–5.1)
Sodium: 137 mmol/L (ref 135–145)
Total Bilirubin: 0.3 mg/dL (ref 0.3–1.2)
Total Protein: 7.5 g/dL (ref 6.5–8.1)

## 2021-12-27 LAB — SAMPLE TO BLOOD BANK

## 2021-12-27 MED ORDER — HEPARIN SOD (PORK) LOCK FLUSH 100 UNIT/ML IV SOLN
500.0000 [IU] | Freq: Once | INTRAVENOUS | Status: AC
  Administered 2021-12-27: 500 [IU]

## 2021-12-27 MED ORDER — SODIUM CHLORIDE 0.9% FLUSH
10.0000 mL | Freq: Once | INTRAVENOUS | Status: AC
  Administered 2021-12-27: 10 mL

## 2021-12-27 MED FILL — Fosaprepitant Dimeglumine For IV Infusion 150 MG (Base Eq): INTRAVENOUS | Qty: 5 | Status: AC

## 2021-12-27 MED FILL — Dexamethasone Sodium Phosphate Inj 100 MG/10ML: INTRAMUSCULAR | Qty: 1 | Status: AC

## 2021-12-27 NOTE — Assessment & Plan Note (Signed)
Her oxygen saturation is normal on room air She does not need oxygen therapy

## 2021-12-27 NOTE — Progress Notes (Signed)
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  She will continue PICC line flushes at home daily. 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   All questions were answered. The patient knows to call the clinic with any problems, questions or concerns. The total time spent in the appointment was 30 minutes encounter with patients including review of chart and various tests results, discussions about plan of care and coordination of care plan   Heath Lark, MD 12/27/2021 3:39 PM  INTERVAL HISTORY: Please see below for problem oriented charting. she returns for treatment follow-up with her husband, seen prior to chemotherapy She has been complaining of some nausea and dizziness recently She has lost some weight Denies recent worsening cough, chest pain or shortness of breath No fever or chills  REVIEW OF SYSTEMS:   Constitutional: Denies fevers, chills  Eyes: Denies blurriness of vision Ears, nose,  mouth, throat, and face: Denies mucositis or sore throat Respiratory: Denies cough, dyspnea or wheezes Cardiovascular: Denies palpitation, chest discomfort or lower extremity swelling Gastrointestinal:  Denies nausea, heartburn or change in bowel habits Skin: Denies abnormal skin rashes Lymphatics: Denies new lymphadenopathy or easy bruising Behavioral/Psych: Mood is stable, no new changes  All other systems were reviewed with the patient and are negative.  I have reviewed the past medical history, past surgical history, social history and family history with the patient and they are unchanged from previous note.  ALLERGIES:  is allergic to oxycodone.  MEDICATIONS:  Current Outpatient Medications  Medication Sig Dispense Refill   acetaminophen (TYLENOL) 500 MG tablet Take 1,000 mg by mouth every 6 (six) hours as needed for moderate pain or headache.     albuterol (VENTOLIN HFA) 108 (90 Base) MCG/ACT inhaler Inhale 2 puffs into the lungs every 4 (four) hours as needed for wheezing or shortness of breath. 18 g 0   allopurinol (ZYLOPRIM) 300 MG tablet Take 1 tablet (300 mg total) by mouth daily. 30 tablet 1   buPROPion (WELLBUTRIN XL) 300 MG 24 hr tablet Take 300 mg by mouth daily.     citalopram (CELEXA) 40 MG tablet Take 40 mg by mouth daily.      dextromethorphan-guaiFENesin (MUCINEX DM) 30-600 MG 12hr tablet Take 1 tablet by mouth 2 (two) times daily. 30 tablet 0   ibuprofen (ADVIL) 200 MG tablet Take 800 mg by mouth every 6 (six) hours as needed for headache, mild pain or fever.     levalbuterol (XOPENEX) 0.63 MG/3ML nebulizer solution Take 3 mLs (0.63  mg total) by nebulization every 4 (four) hours as needed for wheezing or shortness of breath. 3 mL 12   LORazepam (ATIVAN) 0.5 MG tablet Take 1 tablet (0.5 mg total) by mouth 2 (two) times daily as needed for anxiety. 15 tablet 0   Magnesium 300 MG CAPS Take 300 mg by mouth every evening.     Multiple Vitamins-Minerals (MULTIVITAMIN WITH  MINERALS) tablet Take 1 tablet by mouth daily.     ondansetron (ZOFRAN) 8 MG tablet Take 1 tablet (8 mg total) by mouth every 8 (eight) hours as needed for nausea. 30 tablet 3   prochlorperazine (COMPAZINE) 10 MG tablet Take 1 tablet (10 mg total) by mouth every 6 (six) hours as needed for nausea or vomiting. 30 tablet 0   Sodium Chloride Flush (NORMAL SALINE FLUSH) 0.9 % SOLN Please flush 10 ml normal saline daily 30 mL 3   Sodium Chloride Flush (NORMAL SALINE FLUSH) 0.9 % SOLN Inject 10 mLs into the vein daily. 300 mL 3   traZODone (DESYREL) 50 MG tablet Take 1 tablet (50 mg total) by mouth at bedtime. 30 tablet 0   No current facility-administered medications for this visit.    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 Negative for acute PE or thoracic aortic dissection. 2. Encasement and narrowing of the right upper lobe pulmonary artery by tumor. 3. Stable bilateral pulmonary nodules, hilar and mediastinal adenopathy. 4. Progressive consolidation at the right lung base   12/04/2021 Imaging   1. A mixed solid and cystic mass replaces the left ovary measuring roughly 6.8 x 11.0 x 9.2 cm in greatest dimension. This may represent a primary ovarian mass or an ovarian metastasis. 2. Multiple pathologically enlarged lymph nodes within the gastrohepatic ligament, consistent with metastatic disease. 3. Innumerable pulmonary masses within the visualized lung bases bilaterally, better evaluated on previously performed dedicated CT imaging of the chest. 4. Small left pleural effusion. 5. Cholelithiasis without pericholecystic inflammatory change.     12/05/2021 Pathology Results   LUNG, RIGHT LOWER LOBE, NEEDLE CORE BIOPSY:  Non-small cell carcinoma, poorly differentiated with necrosis (see comment)   COMMENT:   Sections show multiple cores of pulmonary parenchyma multifocally infiltrated by sheets and nests of  large, atypical cells with variably large round to oval nuclei and scattered small inconspicuous nucleoli.  There are foci of geographic tumor necrosis.  In areas focally the tumor shows features suggestive of a squamous differentiation. Sixteen immunohistochemical stains are performed with adequate control.  The  tumor is positive for pancytokeratin (AE1/AE3).  The tumor also shows patchy positivity for high molecular weight cytokeratin 5/6.  The tumor is negative for cytokeratin 7 and cytokeratin 20.  The tumor is negative for the squamous markers p40 and p63.  The tumor is negative for the pulmonary adeno markers Napsin A and TTF-1.  The tumor is negative for the GYN and renal marker PAX8.  The tumor is negative for the GYN and mesothelial markers WT1 the tumor shows patchy positivity for the neuroendocrine markers synaptophysin and CD56 the tumor also shows variable positivity for p53.  The tumor shows variable strong positivity for p16.  The tumor is negative for estrogen receptor and progesterone receptor.   In summary the cores show a poorly differentiated non-small cell carcinoma with necrosis exhibiting focal neuroendocrine differentiation by immunohistochemistry.  This immunohistochemical pattern does not  suggest a primary for this tumor.  The cytokeratin 5/6  positivity, although commonly used for squamous differentiation within pulmonary tumors, may be seen in high-grade ovarian tumors particularly serous  carcinomas.  Clinically the patient is reported to have a large ovarian mass with multiple pulmonary nodules and extensive abdominal and mediastinal adenopathy.  Given this history, the p16 and p53 positivity  may be seen in ovarian tumors, but they may also be seen in tumors from other sites.  Also the tumor is negative for the more specific gynecologic markers cytokeratin 7, PAX8 and WT1.  Additional clinical  and radiologic correlation is recommended and an ovarian mass biopsy is suggested.   With additional tissue molecular testing would be possible if needed.    12/05/2021 Procedure   Successful CT-guided core needle biopsy of right lower lung pulmonary nodule/mass.    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 ([place])   12/07/2021 - 12/07/2021 Chemotherapy   Patient is on Treatment Plan : OVARIAN Carboplatin (AUC 6) / Paclitaxel (175) q21d x 6 cycles     12/30/2021 -  Chemotherapy   Patient is on Treatment Plan : LUNG SCLC Carboplatin + Etoposide + Atezolizumab Induction q21d / Atezolizumab Maintenance q21d       PHYSICAL EXAMINATION: ECOG PERFORMANCE STATUS: 1 - Symptomatic but completely ambulatory  Vitals:   12/27/21 1516  BP: (!) 86/65  Pulse: 95  Resp: 18  Temp: 98 F (36.7 C)  SpO2: 96%   Filed Weights   12/27/21 1516  Weight: 146 lb 9.6 oz (66.5 kg)    GENERAL:alert, no distress and comfortable NEURO: alert & oriented x 3 with fluent speech, no focal motor/sensory deficits  LABORATORY DATA:  I have reviewed the data as listed    Component Value Date/Time   NA 137 12/27/2021 1439   K 3.5 12/27/2021 1439   CL 103 12/27/2021 1439   CO2 25 12/27/2021 1439   GLUCOSE 159 (H) 12/27/2021 1439   BUN 16 12/27/2021 1439   CREATININE 0.61 12/27/2021 1439   CALCIUM 9.5 12/27/2021 1439   PROT 7.5 12/27/2021 1439   ALBUMIN 3.8 12/27/2021 1439   AST 24 12/27/2021 1439   ALT 14 12/27/2021 1439   ALKPHOS 105 12/27/2021 1439   BILITOT 0.3 12/27/2021 1439   GFRNONAA >60 12/27/2021 1439    No results found for: "SPEP", "UPEP"  Lab Results   Component Value Date   WBC 6.6 12/27/2021   NEUTROABS 4.0 12/27/2021   HGB 12.3 12/27/2021   HCT 34.7 (L) 12/27/2021   MCV 87.4 12/27/2021   PLT 234 12/27/2021      Chemistry      Component Value Date/Time   NA 137 12/27/2021 1439   K 3.5 12/27/2021 1439   CL 103 12/27/2021 1439   CO2 25 12/27/2021 1439   BUN 16 12/27/2021 1439   CREATININE 0.61 12/27/2021 1439      Component Value Date/Time   CALCIUM 9.5 12/27/2021 1439   ALKPHOS 105 12/27/2021 1439   AST 24 12/27/2021 1439   ALT 14 12/27/2021 1439   BILITOT 0.3 12/27/2021 1439  RADIOGRAPHIC STUDIES: I have personally reviewed the radiological images as listed and agreed with the findings in the report. DG CHEST PORT 1 VIEW  Result Date: 12/10/2021 CLINICAL DATA:  Hypoxia. EXAM: PORTABLE CHEST 1 VIEW COMPARISON:  December 06, 2021. FINDINGS: Stable cardiomediastinal silhouette. Right-sided PICC line is unchanged in position. Stable nodular opacities are noted throughout both lungs consistent with metastatic disease. Small bilateral pleural effusions may be present. Bony thorax is unremarkable. IMPRESSION: Stable nodular opacities are noted throughout both lungs most consistent with metastatic disease. Small pleural effusions may be present. Electronically Signed   By: Marijo Conception M.D.   On: 12/10/2021 08:29   DG Chest Port 1 View  Result Date: 12/06/2021 CLINICAL DATA:  Provided history: Acute respiratory failure with hypoxia. Shortness of breath. EXAM: PORTABLE CHEST 1 VIEW COMPARISON:  Chest radiograph 12/05/2021 and earlier. Chest CT 12/04/2021. FINDINGS: Unchanged position of a right-sided PICC with tip projecting at the level of the superior cavoatrial junction. The cardiomediastinal silhouette is unchanged. Known mediastinal and hilar lymphadenopathy. Again demonstrated are extensive mid to lower lung zone predominant opacities compatible with pulmonary metastatic disease. Superimposed pneumonia cannot be excluded.  Findings are similar to the prior chest radiograph of 12/05/2021. A portion of the left lateral costophrenic angle is excluded from the field of view. Small bilateral pleural effusions. No evidence of pneumothorax. IMPRESSION: No significant change from the prior chest radiograph 12/05/2021. Redemonstrated extensive mid-to-lower lung zone predominant opacities compatible with pulmonary metastatic disease. Superimposed pneumonia cannot be excluded. Small bilateral pleural effusions. Electronically Signed   By: Kellie Simmering D.O.   On: 12/06/2021 09:14   CT BIOPSY  Result Date: 12/05/2021 INDICATION: Multiple lung masses EXAM: CT-guided biopsy of right lower lung mass MEDICATIONS: Per EMR ANESTHESIA/SEDATION: Local analgesia FLUOROSCOPY TIME:  N/a COMPLICATIONS: None immediate. PROCEDURE: Informed written consent was obtained from the patient after a thorough discussion of the procedural risks, benefits and alternatives. All questions were addressed. Maximal Sterile Barrier Technique was utilized including caps, mask, sterile gowns, sterile gloves, sterile drape, hand hygiene and skin antiseptic. A timeout was performed prior to the initiation of the procedure. The patient was placed in the lateral decubitus position with right side down on the CT exam table. Limited CT of the chest was performed for planning purposes. This again demonstrated innumerable bilateral pulmonary masses. Inappropriate right lower lung subpleural nodule/mass was selected as appropriate for percutaneous biopsy. Skin entry site was marked, and the overlying skin was prepped and draped in the standard sterile fashion. Local analgesia was obtained with 1% lidocaine. Using intermittent CT fluoroscopy, a 17 gauge introducer needle was advanced towards the target subpleural nodule in the right lower lobe. Subsequently, core needle biopsy was performed using an 18 gauge core biopsy device x4 total passes. Grossly adequate appearing specimens were  submitted in formalin to pathology for further handling. Needle was removed and an occlusive dressing was placed. Postprocedure imaging demonstrated small volume bleeding surrounding the biopsied mass within expected limits and found to be stable over a short monitoring interval. Patient overall tolerated the procedure well, and was transferred to recovery in stable condition. IMPRESSION: Successful CT-guided core needle biopsy of right lower lung pulmonary nodule/mass. Electronically Signed   By: Albin Felling M.D.   On: 12/05/2021 15:02   DG Chest Port 1 View  Result Date: 12/05/2021 CLINICAL DATA:  Right lung biopsy EXAM: PORTABLE CHEST 1 VIEW COMPARISON:  11/26/2021 FINDINGS: Right-sided PICC line terminates at the level of the superior cavoatrial junction.  Stable heart size. Low lung volumes. Extensive bilateral airspace opacities compatible with diffuse pulmonary metastatic disease. Prominence of the mediastinal and hilar borders compatible with underlying lymphadenopathy. Low lung volumes. No pneumothorax identified. Portion of the lung apices obscured by patient's chin. IMPRESSION: 1. No pneumothorax identified following right lung biopsy. 2. Right-sided PICC line terminates at the level of the superior cavoatrial junction. 3. Extensive bilateral airspace opacities compatible with diffuse pulmonary metastatic disease. Electronically Signed   By: Davina Poke D.O.   On: 12/05/2021 11:29   [place] EKG SITE RITE  Result Date: 12/04/2021 If Site Rite image not attached, placement could not be confirmed due to current cardiac rhythm.  CT Angio Chest Pulmonary Embolism (PE) W or WO Contrast  Result Date: 12/04/2021 CLINICAL DATA:  Chest pain or SOB, pleurisy or effusion suspected EXAM: CT ANGIOGRAPHY CHEST WITH CONTRAST TECHNIQUE: Multidetector CT imaging of the chest was performed using the standard protocol during bolus administration of intravenous contrast. Multiplanar CT image reconstructions and  MIPs were obtained to evaluate the vascular anatomy. RADIATION DOSE REDUCTION: This exam was performed according to the departmental dose-optimization program which includes automated exposure control, adjustment of the mA and/or kV according to patient size and/or use of iterative reconstruction technique. CONTRAST:  73m OMNIPAQUE IOHEXOL 350 MG/ML SOLN COMPARISON:  12/03/2021 FINDINGS: Cardiovascular: SVC is patent. Heart size normal. No pericardial effusion. Satisfactory opacification of pulmonary arteries noted, and there is no evidence of pulmonary emboli. There is encasement and narrowing of right upper lobe pulmonary artery branch by tumor. Adequate contrast opacification of the thoracic aorta with no evidence of dissection, aneurysm, or stenosis. There is classic 3-vessel brachiocephalic arch anatomy without proximal stenosis. Mediastinum/Nodes: Bulky mediastinal and bilateral hilar adenopathy as seen on previous day's exam Lungs/Pleura: Innumerable pulmonary nodules bilaterally as before. Progressive airspace consolidation at the right lung base base. Trace left pleural effusion as before. No pneumothorax. Upper Abdomen: Stable gastrohepatic lymphadenopathy. No acute findings. Musculoskeletal: No chest wall abnormality. No acute or significant osseous findings. Review of the MIP images confirms the above findings. IMPRESSION: 1. Negative for acute PE or thoracic aortic dissection. 2. Encasement and narrowing of the right upper lobe pulmonary artery by tumor. 3. Stable bilateral pulmonary nodules, hilar and mediastinal adenopathy. 4. Progressive consolidation at the right lung base Electronically Signed   By: DLucrezia EuropeM.D.   On: 12/04/2021 10:42   CT ABDOMEN PELVIS W CONTRAST  Result Date: 12/04/2021 CLINICAL DATA:  Weight loss, unintended masses on lung CT, weight loss, lymph involvement. Pulmonary metastatic disease of unknown primary. EXAM: CT ABDOMEN AND PELVIS WITH CONTRAST TECHNIQUE:  Multidetector CT imaging of the abdomen and pelvis was performed using the standard protocol following bolus administration of intravenous contrast. RADIATION DOSE REDUCTION: This exam was performed according to the departmental dose-optimization program which includes automated exposure control, adjustment of the mA and/or kV according to patient size and/or use of iterative reconstruction technique. CONTRAST:  853mOMNIPAQUE IOHEXOL 300 MG/ML  SOLN COMPARISON:  None. Findings are correlated with CT examination of the chest of 12/03/2021. FINDINGS: Lower chest: Innumerable pulmonary masses are again identified within the visualized lung bases bilaterally, better evaluated on previously performed dedicated CT imaging of the chest. Bulky bilateral pleural metastatic disease noted. Small left pleural effusion again seen. Pericardial lymphadenopathy noted. Hepatobiliary: No enhancing intrahepatic mass. No intra or extrahepatic biliary ductal dilation. Cholelithiasis without pericholecystic inflammatory change noted. Pancreas: Unremarkable Spleen: Unremarkable Adrenals/Urinary Tract: Adrenal glands are unremarkable. Kidneys are normal, without renal calculi,  focal lesion, or hydronephrosis. Bladder is unremarkable. Stomach/Bowel: Stomach is within normal limits. Appendix appears normal. No evidence of bowel wall thickening, distention, or inflammatory changes. Vascular/Lymphatic: Multiple pathologically enlarged lymph nodes are seen within the gastrohepatic ligament measuring up to 18 mm in short axis diameter at axial image # 23/3. The abdominal vasculature is unremarkable. Reproductive: A mixed solid and cystic mass replaces the left ovary measuring roughly 6.8 x 11.0 x 9.2 cm in greatest dimension. This may represent a primary ovarian mass or an ovarian metastasis. Uterus and right ovary are unremarkable. Other: No abdominal wall hernia. Musculoskeletal: No acute bone abnormality. No lytic or blastic bone lesion.  IMPRESSION: 1. A mixed solid and cystic mass replaces the left ovary measuring roughly 6.8 x 11.0 x 9.2 cm in greatest dimension. This may represent a primary ovarian mass or an ovarian metastasis. 2. Multiple pathologically enlarged lymph nodes within the gastrohepatic ligament, consistent with metastatic disease. 3. Innumerable pulmonary masses within the visualized lung bases bilaterally, better evaluated on previously performed dedicated CT imaging of the chest. 4. Small left pleural effusion. 5. Cholelithiasis without pericholecystic inflammatory change. Electronically Signed   By: Fidela Salisbury M.D.   On: 12/04/2021 01:49   CT Chest Wo Contrast  Result Date: 12/03/2021 CLINICAL DATA:  Pneumonia suspected; * Tracking Code: BO * EXAM: CT CHEST WITHOUT CONTRAST TECHNIQUE: Multidetector CT imaging of the chest was performed following the standard protocol without IV contrast. RADIATION DOSE REDUCTION: This exam was performed according to the departmental dose-optimization program which includes automated exposure control, adjustment of the mA and/or kV according to patient size and/or use of iterative reconstruction technique. COMPARISON:  Radiograph dated November 26, 2021 FINDINGS: Cardiovascular: Normal heart size. Trace pericardial fluid. No coronary artery calcifications. Normal caliber thoracic aorta with no atherosclerotic disease. Mediastinum/Nodes: Esophagus and thyroid are unremarkable. Bulky enlarged mediastinal and bilateral hilar lymph nodes. Reference right upper paratracheal lymph node measuring 2.9 cm in short axis on series 3, image 19. Reference AP window lymph node measuring 2.7 cm in short axis on image 25. Reference right hilar lymph node measuring 2.5 cm in short axis on series 3, image 29. Reference left hilar lymph node measuring 2.4 cm in short axis on image 77. Lungs/Pleura: Central airways are patent. Innumerable bilateral solid pulmonary nodules which are most prominent in the lower  lungs reference right middle lobe pulmonary nodule measuring 2.5 x 1.9 cm on series 4, image 92. Reference left lower lobe pulmonary nodule measuring 2.1 x 1.6 cm on image 113. Perifissural/subpleural nodularity and interlobular septal thickening. Small right pleural effusion. Upper Abdomen: Enlarged upper abdominal lymph nodes. Reference gastrohepatic ligament lymph node measuring 1.5 cm in short axis on series 2, image 60. Musculoskeletal: No chest wall mass or suspicious bone lesions identified. IMPRESSION: 1. Innumerable bilateral solid pulmonary nodules which are most prominent in the lower lungs, findings are concerning for metastatic disease. 2. 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 3. Bulky mediastinal bilateral hilar lymph nodes, likely due to nodal metastatic disease. 4. Enlarged lymph nodes of the upper abdomen, concerning for additional site of nodal disease. 5. Small left pleural effusion. Electronically Signed   By: Yetta Glassman M.D.   On: 12/03/2021 16:46

## 2021-12-27 NOTE — Assessment & Plan Note (Addendum)
Clinically, she is doing well and labs are stable She will proceed with treatment as scheduled next week I plan to repeat imaging study after cycle 3 of treatment around mid August Due to her PICC line situation, she will get PICC line dressing changes here weekly.  She will continue PICC line flushes at home daily. She has obtained an appointment to get second opinion at [facility] in a few weeks We have arranged for genetic counseling

## 2021-12-27 NOTE — Assessment & Plan Note (Signed)
She is feeling somewhat dizzy due to low blood pressure I recommend the patient to start monitoring blood pressure at home and to increase electrolyte intake

## 2021-12-29 ENCOUNTER — Emergency Department (HOSPITAL_COMMUNITY)
Admission: EM | Admit: 2021-12-29 | Discharge: 2021-12-30 | Disposition: A | Discharge status: Home / Self Care | Payer: BC Managed Care – PPO | Attending: Emergency Medicine | Admitting: Emergency Medicine

## 2021-12-29 ENCOUNTER — Other Ambulatory Visit: Payer: Self-pay

## 2021-12-29 DIAGNOSIS — R519 Headache, unspecified: Secondary | ICD-10-CM

## 2021-12-29 DIAGNOSIS — R42 Dizziness and giddiness: Secondary | ICD-10-CM | POA: Insufficient documentation

## 2021-12-29 DIAGNOSIS — R55 Syncope and collapse: Secondary | ICD-10-CM

## 2021-12-29 DIAGNOSIS — Z85118 Personal history of other malignant neoplasm of bronchus and lung: Secondary | ICD-10-CM | POA: Insufficient documentation

## 2021-12-29 DIAGNOSIS — R0682 Tachypnea, not elsewhere classified: Secondary | ICD-10-CM | POA: Insufficient documentation

## 2021-12-29 DIAGNOSIS — R112 Nausea with vomiting, unspecified: Secondary | ICD-10-CM

## 2021-12-29 LAB — COMPREHENSIVE METABOLIC PANEL
ALT: 15 U/L (ref 0–44)
AST: 35 U/L (ref 15–41)
Albumin: 3.3 g/dL — ABNORMAL LOW (ref 3.5–5.0)
Alkaline Phosphatase: 93 U/L (ref 38–126)
Anion gap: 9 (ref 5–15)
BUN: 16 mg/dL (ref 6–20)
CO2: 22 mmol/L (ref 22–32)
Calcium: 9 mg/dL (ref 8.9–10.3)
Chloride: 106 mmol/L (ref 98–111)
Creatinine, Ser: 0.61 mg/dL (ref 0.44–1.00)
GFR, Estimated: >60 mL/min (ref >60)
Glucose, Bld: 105 mg/dL — ABNORMAL HIGH (ref 70–99)
Potassium: 3.7 mmol/L (ref 3.5–5.1)
Sodium: 137 mmol/L (ref 135–145)
Total Bilirubin: 0.5 mg/dL (ref 0.3–1.2)
Total Protein: 7.3 g/dL (ref 6.5–8.1)

## 2021-12-29 LAB — CBC WITH DIFFERENTIAL/PLATELET
Abs Immature Granulocytes: 0.18 10*3/uL — ABNORMAL HIGH (ref 0.00–0.07)
Basophils Absolute: 0.1 10*3/uL (ref 0.0–0.1)
Basophils Relative: 1 %
Eosinophils Absolute: 0.1 10*3/uL (ref 0.0–0.5)
Eosinophils Relative: 1 %
HCT: 34.5 % — ABNORMAL LOW (ref 36.0–46.0)
Hemoglobin: 12 g/dL (ref 12.0–15.0)
Immature Granulocytes: 2 %
Lymphocytes Relative: 17 %
Lymphs Abs: 1.4 10*3/uL (ref 0.7–4.0)
MCH: 30.8 pg (ref 26.0–34.0)
MCHC: 34.8 g/dL (ref 30.0–36.0)
MCV: 88.5 fL (ref 80.0–100.0)
Monocytes Absolute: 1.2 10*3/uL — ABNORMAL HIGH (ref 0.1–1.0)
Monocytes Relative: 15 %
Neutro Abs: 5.3 10*3/uL (ref 1.7–7.7)
Neutrophils Relative %: 64 %
Platelets: 191 10*3/uL (ref 150–400)
RBC: 3.9 MIL/uL (ref 3.87–5.11)
RDW: 13.4 % (ref 11.5–15.5)
WBC: 8.2 10*3/uL (ref 4.0–10.5)
nRBC: 0 % (ref 0.0–0.2)

## 2021-12-29 LAB — LIPASE, BLOOD: Lipase: 23 U/L (ref 11–51)

## 2021-12-29 LAB — LACTIC ACID, PLASMA: Lactic Acid, Venous: 1.1 mmol/L (ref 0.5–1.9)

## 2021-12-29 MED ORDER — LACTATED RINGERS IV BOLUS
1000.0000 mL | Freq: Once | INTRAVENOUS | Status: AC
  Administered 2021-12-29: 1000 mL via INTRAVENOUS

## 2021-12-29 MED ORDER — METOCLOPRAMIDE HCL 5 MG/ML IJ SOLN
10.0000 mg | Freq: Once | INTRAMUSCULAR | Status: AC
Start: 2021-12-29 — End: 2021-12-29
  Administered 2021-12-29: 10 mg via INTRAVENOUS
  Filled 2021-12-29: qty 2

## 2021-12-29 MED ORDER — ACETAMINOPHEN 500 MG PO TABS
1000.0000 mg | ORAL_TABLET | Freq: Once | ORAL | Status: AC
Start: 2021-12-29 — End: 2021-12-29
  Administered 2021-12-29: 1000 mg via ORAL
  Filled 2021-12-29: qty 2

## 2021-12-29 NOTE — ED Triage Notes (Signed)
BIBA from home for near syncopal episode, recently dx with lung cancer and had first chemo txt 3 weeks ago, has some nausea.    128/74 84 CBG 122 94% RA 18 LAC 541m NS prior to arrival

## 2021-12-29 NOTE — ED Provider Notes (Signed)
[facility] DEPT Provider Note   CSN: [id number] Arrival date & time: 12/29/21  2024     History  Chief Complaint  Patient presents with   Near Syncope    Melanie Ramirez is a 41 y.o. female.  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  Patient reports since getting chemotherapy as specifically since Friday she has had worsening symptoms.  Her husband reports she really has not been drinking much or eating much at all since Friday and the above symptoms have worsened.  She reports today she was standing when everything started going black and she got down on her hands and knees and had to crawl.  She has not had any diarrhea, fever, cough, blood in her stool.  She has not had significant vomiting but has had some intermittently.  She denies any focal abdominal pain and no dysuria, frequency or urgency but has been noted that her urine did look very dark today.  The history is provided by the patient, the spouse and medical records.  Near Syncope       Home Medications Prior to Admission medications   Medication Sig Start Date End Date Taking? Authorizing Provider  acetaminophen (TYLENOL) 500 MG tablet Take 1,000 mg by mouth every 6 (six) hours as needed for moderate pain or headache.    [provider]  albuterol (VENTOLIN HFA) 108 (90 Base) MCG/ACT inhaler Inhale 2 puffs into the lungs every 4 (four) hours as needed for wheezing or shortness of breath. 12/14/21   Kathie Dike, MD  allopurinol (ZYLOPRIM) 300 MG tablet Take 1 tablet (300 mg total) by mouth daily. 12/13/21   Heath Lark, MD  buPROPion (WELLBUTRIN XL) 300 MG 24 hr tablet Take 300 mg by mouth daily. 11/24/21   [provider]  citalopram (CELEXA) 40 MG tablet Take 40 mg by mouth daily.     [provider]  dextromethorphan-guaiFENesin (MUCINEX DM) 30-600 MG 12hr tablet Take 1 tablet by mouth 2 (two) times daily. 12/14/21   Kathie Dike, MD  ibuprofen (ADVIL) 200 MG tablet Take 800 mg by mouth every 6 (six) hours as needed for headache, mild pain or fever.    [provider]  levalbuterol Penne Lash) 0.63 MG/3ML nebulizer solution Take 3 mLs (0.63 mg total) by nebulization every 4 (four) hours as needed for wheezing or shortness of breath. 12/14/21   Kathie Dike, MD  LORazepam (ATIVAN) 0.5 MG tablet Take 1 tablet (0.5 mg total) by mouth 2 (two) times daily as needed for anxiety. 12/14/21 12/14/22  Kathie Dike, MD  Magnesium 300 MG CAPS Take 300 mg by mouth every evening.    [provider]  Multiple Vitamins-Minerals (MULTIVITAMIN WITH MINERALS) tablet Take 1 tablet by mouth daily.    [provider]  ondansetron (ZOFRAN) 8 MG tablet Take 1 tablet (8 mg total) by mouth every 8 (eight) hours as needed for nausea. 12/13/21   Heath Lark, MD  prochlorperazine (COMPAZINE) 10 MG tablet Take 1 tablet (10 mg total) by mouth every 6 (six) hours as needed for nausea or vomiting. 12/13/21   Heath Lark, MD  Sodium Chloride Flush (NORMAL SALINE FLUSH) 0.9 % SOLN Please flush 10 ml normal saline daily  12/13/21   Heath Lark, MD  Sodium Chloride Flush (NORMAL SALINE FLUSH) 0.9 % SOLN Inject 10 mLs into the vein daily. 12/20/21   Heath Lark, MD  traZODone (DESYREL) 50 MG tablet Take 1 tablet (50 mg total) by mouth at bedtime. 12/14/21   Kathie Dike, MD      Allergies    Oxycodone    Review of Systems   Review of Systems  Cardiovascular:  Positive for near-syncope.    Physical Exam Updated Vital Signs BP 127/79   Pulse 78   Temp 97.6 F (36.4 C) (Oral)   Resp 20   Ht '5\' 5"'$  (1.651 m)   Wt 72.6 kg   LMP 11/22/2021 Comment: pt's husband has had a vasectomy.  SpO2 94%    BMI 26.63 kg/m  Physical Exam Vitals and nursing note reviewed.  Constitutional:      General: She is not in acute distress.    Appearance: She is well-developed. She is ill-appearing.  HENT:     Head: Normocephalic and atraumatic.     Mouth/Throat:     Mouth: Mucous membranes are dry.  Eyes:     Conjunctiva/sclera: Conjunctivae normal.     Pupils: Pupils are equal, round, and reactive to light.  Cardiovascular:     Rate and Rhythm: Normal rate and regular rhythm.     Heart sounds: No murmur heard. Pulmonary:     Effort: Pulmonary effort is normal. No respiratory distress.     Breath sounds: Normal breath sounds. No wheezing or rales.  Abdominal:     General: There is no distension.     Palpations: Abdomen is soft.     Tenderness: There is no abdominal tenderness. There is no guarding or rebound.  Musculoskeletal:        General: No tenderness. Normal range of motion.     Cervical back: Normal range of motion and neck supple.     Right lower leg: No edema.     Left lower leg: No edema.  Skin:    General: Skin is warm and dry.     Coloration: Skin is pale.     Findings: No erythema or rash.  Neurological:     Mental Status: She is alert and oriented to person, place, and time. Mental status is at baseline.  Psychiatric:        Behavior: Behavior normal.     ED Results / Procedures / Treatments   Labs (all labs ordered are listed, but only abnormal results are displayed) Labs Reviewed  CBC WITH DIFFERENTIAL/PLATELET - Abnormal; Notable for the following components:      Result Value   HCT 34.5 (*)    Monocytes Absolute 1.2 (*)    Abs Immature Granulocytes 0.18 (*)    All other components within normal limits  COMPREHENSIVE METABOLIC PANEL - Abnormal; Notable for the following components:   Glucose, Bld 105 (*)    Albumin 3.3 (*)    All other components within normal limits  LIPASE, BLOOD  LACTIC ACID, PLASMA  LACTIC ACID, PLASMA    EKG None  Radiology No  results found.  Procedures Procedures    Medications Ordered in ED Medications  lactated ringers bolus 1,000 mL (1,000 mLs Intravenous New Bag/Given 12/29/21 2204)  metoCLOPramide (REGLAN) injection 10 mg (10 mg Intravenous Given 12/29/21 2207)  acetaminophen (TYLENOL) tablet 1,000 mg (1,000 mg Oral Given 12/29/21 2239)    ED Course/ Medical Decision Making/ A&P  Medical Decision Making Amount and/or Complexity of Data Reviewed Labs: ordered.  Risk OTC drugs. Prescription drug management.   Pt with multiple medical problems and comorbidities and presenting today with a complaint that caries a high risk for morbidity and mortality.  Presenting today with near syncope, nausea, vomiting and dizziness.  Symptoms most likely related to recent cycle of chemotherapy.  She denies any fever and lower suspicion for an acute infectious etiology.  She is mildly tachypneic but vital signs are otherwise normal.  Patient keeps her eyes closed and just reports she feels generally unwell.  Breath sounds are clear today.  She denies any chest pain and low suspicion for ACS or PE.  Concern for dehydration due to poor oral intake.  I independently interpreted patient's labs and EKG and lactic acid, lipase, CMP, CBC all without acute findings.  No evidence of neutropenia or electrolyte abnormality today.  Patient checked out to Dr. Laverta Baltimore for repeat assessment        Final Clinical Impression(s) / ED Diagnoses Final diagnoses:  None    Rx / DC Orders ED Discharge Orders     None         Blanchie Dessert, MD 12/29/21 2334

## 2021-12-30 ENCOUNTER — Other Ambulatory Visit: Payer: Self-pay

## 2021-12-30 ENCOUNTER — Encounter: Payer: Self-pay | Admitting: Hematology and Oncology

## 2021-12-30 ENCOUNTER — Inpatient Hospital Stay (HOSPITAL_BASED_OUTPATIENT_CLINIC_OR_DEPARTMENT_OTHER): Payer: BC Managed Care – PPO | Admitting: Hematology and Oncology

## 2021-12-30 ENCOUNTER — Inpatient Hospital Stay: Payer: BC Managed Care – PPO

## 2021-12-30 ENCOUNTER — Encounter: Payer: Self-pay | Admitting: General Practice

## 2021-12-30 VITALS — BP 127/77 | HR 94 | Temp 98.4°F | Resp 18 | Wt 153.9 lb

## 2021-12-30 DIAGNOSIS — C7802 Secondary malignant neoplasm of left lung: Secondary | ICD-10-CM

## 2021-12-30 DIAGNOSIS — I950 Idiopathic hypotension: Secondary | ICD-10-CM

## 2021-12-30 DIAGNOSIS — C7800 Secondary malignant neoplasm of unspecified lung: Secondary | ICD-10-CM

## 2021-12-30 DIAGNOSIS — R031 Nonspecific low blood-pressure reading: Secondary | ICD-10-CM | POA: Diagnosis not present

## 2021-12-30 DIAGNOSIS — R519 Headache, unspecified: Secondary | ICD-10-CM | POA: Diagnosis not present

## 2021-12-30 DIAGNOSIS — C7801 Secondary malignant neoplasm of right lung: Secondary | ICD-10-CM

## 2021-12-30 DIAGNOSIS — R112 Nausea with vomiting, unspecified: Secondary | ICD-10-CM

## 2021-12-30 DIAGNOSIS — C801 Malignant (primary) neoplasm, unspecified: Secondary | ICD-10-CM

## 2021-12-30 MED ORDER — SODIUM CHLORIDE 0.9 % IV SOLN
629.0000 mg | Freq: Once | INTRAVENOUS | Status: AC
  Administered 2021-12-30: 630 mg via INTRAVENOUS
  Filled 2021-12-30: qty 63

## 2021-12-30 MED ORDER — SODIUM CHLORIDE 0.9 % IV SOLN
10.0000 mg | Freq: Once | INTRAVENOUS | Status: AC
  Administered 2021-12-30: 10 mg via INTRAVENOUS
  Filled 2021-12-30: qty 10

## 2021-12-30 MED ORDER — SODIUM CHLORIDE 0.9% FLUSH
10.0000 mL | INTRAVENOUS | Status: DC | PRN
  Administered 2021-12-30: 10 mL

## 2021-12-30 MED ORDER — HEPARIN SOD (PORK) LOCK FLUSH 100 UNIT/ML IV SOLN: 500.0000 [IU] | Freq: Once | INTRAVENOUS | Status: DC | PRN

## 2021-12-30 MED ORDER — PROCHLORPERAZINE EDISYLATE 10 MG/2ML IJ SOLN
10.0000 mg | Freq: Once | INTRAMUSCULAR | Status: AC
  Administered 2021-12-30: 10 mg via INTRAVENOUS
  Filled 2021-12-30: qty 2

## 2021-12-30 MED ORDER — SODIUM CHLORIDE 0.9 % IV SOLN
1200.0000 mg | Freq: Once | INTRAVENOUS | Status: AC
  Administered 2021-12-30: 1200 mg via INTRAVENOUS
  Filled 2021-12-30: qty 20

## 2021-12-30 MED ORDER — SODIUM CHLORIDE 0.9 % IV SOLN
100.0000 mg/m2 | Freq: Once | INTRAVENOUS | Status: AC
  Administered 2021-12-30: 180 mg via INTRAVENOUS
  Filled 2021-12-30: qty 9

## 2021-12-30 MED ORDER — PALONOSETRON HCL INJECTION 0.25 MG/5ML
0.2500 mg | Freq: Once | INTRAVENOUS | Status: AC
  Administered 2021-12-30: 0.25 mg via INTRAVENOUS
  Filled 2021-12-30: qty 5

## 2021-12-30 MED ORDER — SODIUM CHLORIDE 0.9 % IV SOLN: Freq: Once | INTRAVENOUS | Status: AC

## 2021-12-30 MED ORDER — KETOROLAC TROMETHAMINE 30 MG/ML IJ SOLN
30.0000 mg | Freq: Once | INTRAMUSCULAR | Status: AC
  Administered 2021-12-30: 30 mg via INTRAVENOUS
  Filled 2021-12-30: qty 1

## 2021-12-30 MED ORDER — SODIUM CHLORIDE 0.9 % IV SOLN
150.0000 mg | Freq: Once | INTRAVENOUS | Status: AC
  Administered 2021-12-30: 150 mg via INTRAVENOUS
  Filled 2021-12-30: qty 150

## 2021-12-30 MED FILL — Dexamethasone Sodium Phosphate Inj 100 MG/10ML: INTRAMUSCULAR | Qty: 1 | Status: AC

## 2021-12-30 NOTE — Patient Instructions (Signed)
[facility]  Discharge Instructions: Thank you for choosing [facility] to provide your oncology and hematology care.   If you have a lab appointment with the [facility], please go directly to the [facility] and check in at the registration area.   Wear comfortable clothing and clothing appropriate for easy access to any Portacath or PICC line.   We strive to give you quality time with your provider. You may need to reschedule your appointment if you arrive late (15 or more minutes).  Arriving late affects you and other patients whose appointments are after yours.  Also, if you miss three or more appointments without notifying the office, you may be dismissed from the clinic at the provider's discretion.      For prescription refill requests, have your pharmacy contact our office and allow 72 hours for refills to be completed.    Today you received the following chemotherapy and/or immunotherapy agents: Tecentriq, Carboplatin, Etoposide      To help prevent nausea and vomiting after your treatment, we encourage you to take your nausea medication as directed.  BELOW ARE SYMPTOMS THAT SHOULD BE REPORTED IMMEDIATELY: *FEVER GREATER THAN 100.4 F (38 C) OR HIGHER *CHILLS OR SWEATING *NAUSEA AND VOMITING THAT IS NOT CONTROLLED WITH YOUR NAUSEA MEDICATION *UNUSUAL SHORTNESS OF BREATH *UNUSUAL BRUISING OR BLEEDING *URINARY PROBLEMS (pain or burning when urinating, or frequent urination) *BOWEL PROBLEMS (unusual diarrhea, constipation, pain near the anus) TENDERNESS IN MOUTH AND THROAT WITH OR WITHOUT PRESENCE OF ULCERS (sore throat, sores in mouth, or a toothache) UNUSUAL RASH, SWELLING OR PAIN  UNUSUAL VAGINAL DISCHARGE OR ITCHING   Items with * indicate a potential emergency and should be followed up as soon as possible or go to the Emergency Department if any problems should occur.  Please show the CHEMOTHERAPY ALERT CARD or IMMUNOTHERAPY  ALERT CARD at check-in to the Emergency Department and triage nurse.  Should you have questions after your visit or need to cancel or reschedule your appointment, please contact [facility]  Dept: [phone]  and follow the prompts.  Office hours are 8:00 a.m. to 4:30 p.m. Monday - Friday. Please note that voicemails left after 4:00 p.m. may not be returned until the following business day.  We are closed weekends and major holidays. You have access to a nurse at all times for urgent questions. Please call the main number to the clinic Dept: [phone] and follow the prompts.   For any non-urgent questions, you may also contact your provider using MyChart. We now offer e-Visits for anyone 29 and older to request care online for non-urgent symptoms. For details visit [email or web address].   Also download the MyChart app! Go to the app store, search "MyChart", open the app, select [facility], and log in with your MyChart username and password.  Masks are optional in the cancer centers. If you would like for your care team to wear a mask while they are taking care of you, please let them know. For doctor visits, patients may have with them one support person who is at least 40 years old. At this time, visitors are not allowed in the infusion area. Atezolizumab injection What is this medication? ATEZOLIZUMAB (a te zoe LIZ ue mab) is a monoclonal antibody. It is used to treat bladder cancer (urothelial cancer), liver cancer, lung cancer, and melanoma. This medicine may be used for other purposes; ask your health care provider or pharmacist if you  have questions. COMMON BRAND NAME(S): Tecentriq What should I tell my care team before I take this medication? They need to know if you have any of these conditions: autoimmune diseases like Crohn's disease, ulcerative colitis, or lupus have had or planning to have an allogeneic stem cell transplant (uses someone else's  stem cells) history of organ transplant history of radiation to the chest nervous system problems like myasthenia gravis or Guillain-Barre syndrome an unusual or allergic reaction to atezolizumab, other medicines, foods, dyes, or preservatives pregnant or trying to get pregnant breast-feeding How should I use this medication? This medicine is for infusion into a vein. It is given by a health care professional in a hospital or clinic setting. A special MedGuide will be given to you before each treatment. Be sure to read this information carefully each time. Talk to your pediatrician regarding the use of this medicine in children. Special care may be needed. Overdosage: If you think you have taken too much of this medicine contact a poison control center or emergency room at once. NOTE: This medicine is only for you. Do not share this medicine with others. What if I miss a dose? It is important not to miss your dose. Call your doctor or health care professional if you are unable to keep an appointment. What may interact with this medication? Interactions have not been studied. This list may not describe all possible interactions. Give your health care provider a list of all the medicines, herbs, non-prescription drugs, or dietary supplements you use. Also tell them if you smoke, drink alcohol, or use illegal drugs. Some items may interact with your medicine. What should I watch for while using this medication? Your condition will be monitored carefully while you are receiving this medicine. You may need blood work done while you are taking this medicine. Do not become pregnant while taking this medicine or for at least 5 months after stopping it. Women should inform their doctor if they wish to become pregnant or think they might be pregnant. There is a potential for serious side effects to an unborn child. Talk to your health care professional or pharmacist for more information. Do not  breast-feed an infant while taking this medicine or for at least 5 months after the last dose. What side effects may I notice from receiving this medication? Side effects that you should report to your doctor or health care professional as soon as possible: allergic reactions like skin rash, itching or hives, swelling of the face, lips, or tongue black, tarry stools bloody or watery diarrhea breathing problems changes in vision chest pain or chest tightness chills facial flushing fever headache signs and symptoms of high blood sugar such as dizziness; dry mouth; dry skin; fruity breath; nausea; stomach pain; increased hunger or thirst; increased urination signs and symptoms of liver injury like dark yellow or brown urine; general ill feeling or flu-like symptoms; light-colored stools; loss of appetite; nausea; right upper belly pain; unusually weak or tired; yellowing of the eyes or skin stomach pain trouble passing urine or change in the amount of urine Side effects that usually do not require medical attention (report to your doctor or health care professional if they continue or are bothersome): bone pain cough diarrhea joint pain muscle pain muscle weakness swelling of arms or legs tiredness weight loss This list may not describe all possible side effects. Call your doctor for medical advice about side effects. You may report side effects to FDA at 1-800-FDA-1088. Where should  I keep my medication? This drug is given in a hospital or clinic and will not be stored at home. NOTE: This sheet is a summary. It may not cover all possible information. If you have questions about this medicine, talk to your doctor, pharmacist, or health care provider.  2023 Elsevier/Gold Standard (2021-05-10 00:00:00) Carboplatin injection What is this medication? CARBOPLATIN (KAR boe pla tin) is a chemotherapy drug. It targets fast dividing cells, like cancer cells, and causes these cells to die. This  medicine is used to treat ovarian cancer and many other cancers. This medicine may be used for other purposes; ask your health care provider or pharmacist if you have questions. COMMON BRAND NAME(S): Paraplatin What should I tell my care team before I take this medication? They need to know if you have any of these conditions: blood disorders hearing problems kidney disease recent or ongoing radiation therapy an unusual or allergic reaction to carboplatin, cisplatin, other chemotherapy, other medicines, foods, dyes, or preservatives pregnant or trying to get pregnant breast-feeding How should I use this medication? This drug is usually given as an infusion into a vein. It is administered in a hospital or clinic by a specially trained health care professional. Talk to your pediatrician regarding the use of this medicine in children. Special care may be needed. Overdosage: If you think you have taken too much of this medicine contact a poison control center or emergency room at once. NOTE: This medicine is only for you. Do not share this medicine with others. What if I miss a dose? It is important not to miss a dose. Call your doctor or health care professional if you are unable to keep an appointment. What may interact with this medication? medicines for seizures medicines to increase blood counts like filgrastim, pegfilgrastim, sargramostim some antibiotics like amikacin, gentamicin, neomycin, streptomycin, tobramycin vaccines Talk to your doctor or health care professional before taking any of these medicines: acetaminophen aspirin ibuprofen ketoprofen naproxen This list may not describe all possible interactions. Give your health care provider a list of all the medicines, herbs, non-prescription drugs, or dietary supplements you use. Also tell them if you smoke, drink alcohol, or use illegal drugs. Some items may interact with your medicine. What should I watch for while using this  medication? Your condition will be monitored carefully while you are receiving this medicine. You will need important blood work done while you are taking this medicine. This drug may make you feel generally unwell. This is not uncommon, as chemotherapy can affect healthy cells as well as cancer cells. Report any side effects. Continue your course of treatment even though you feel ill unless your doctor tells you to stop. In some cases, you may be given additional medicines to help with side effects. Follow all directions for their use. Call your doctor or health care professional for advice if you get a fever, chills or sore throat, or other symptoms of a cold or flu. Do not treat yourself. This drug decreases your body's ability to fight infections. Try to avoid being around people who are sick. This medicine may increase your risk to bruise or bleed. Call your doctor or health care professional if you notice any unusual bleeding. Be careful brushing and flossing your teeth or using a toothpick because you may get an infection or bleed more easily. If you have any dental work done, tell your dentist you are receiving this medicine. Avoid taking products that contain aspirin, acetaminophen, ibuprofen, naproxen, or ketoprofen  unless instructed by your doctor. These medicines may hide a fever. Do not become pregnant while taking this medicine. Women should inform their doctor if they wish to become pregnant or think they might be pregnant. There is a potential for serious side effects to an unborn child. Talk to your health care professional or pharmacist for more information. Do not breast-feed an infant while taking this medicine. What side effects may I notice from receiving this medication? Side effects that you should report to your doctor or health care professional as soon as possible: allergic reactions like skin rash, itching or hives, swelling of the face, lips, or tongue signs of infection -  fever or chills, cough, sore throat, pain or difficulty passing urine signs of decreased platelets or bleeding - bruising, pinpoint red spots on the skin, black, tarry stools, nosebleeds signs of decreased red blood cells - unusually weak or tired, fainting spells, lightheadedness breathing problems changes in hearing changes in vision chest pain high blood pressure low blood counts - This drug may decrease the number of white blood cells, red blood cells and platelets. You may be at increased risk for infections and bleeding. nausea and vomiting pain, swelling, redness or irritation at the injection site pain, tingling, numbness in the hands or feet problems with balance, talking, walking trouble passing urine or change in the amount of urine Side effects that usually do not require medical attention (report to your doctor or health care professional if they continue or are bothersome): hair loss loss of appetite metallic taste in the mouth or changes in taste This list may not describe all possible side effects. Call your doctor for medical advice about side effects. You may report side effects to FDA at 1-800-FDA-1088. Where should I keep my medication? This drug is given in a hospital or clinic and will not be stored at home. NOTE: This sheet is a summary. It may not cover all possible information. If you have questions about this medicine, talk to your doctor, pharmacist, or health care provider.  2023 Elsevier/Gold Standard (2007-11-17 00:00:00) Etoposide, VP-16 injection What is this medication? ETOPOSIDE, VP-16 (e toe POE side) is a chemotherapy drug. It is used to treat testicular cancer, lung cancer, and other cancers. This medicine may be used for other purposes; ask your health care provider or pharmacist if you have questions. COMMON BRAND NAME(S): Etopophos, Toposar, VePesid What should I tell my care team before I take this medication? They need to know if you have any of  these conditions: infection kidney disease liver disease low blood counts, like low white cell, platelet, or red cell counts an unusual or allergic reaction to etoposide, other medicines, foods, dyes, or preservatives pregnant or trying to get pregnant breast-feeding How should I use this medication? This medicine is for infusion into a vein. It is administered in a hospital or clinic by a specially trained health care professional. Talk to your pediatrician regarding the use of this medicine in children. Special care may be needed. Overdosage: If you think you have taken too much of this medicine contact a poison control center or emergency room at once. NOTE: This medicine is only for you. Do not share this medicine with others. What if I miss a dose? It is important not to miss your dose. Call your doctor or health care professional if you are unable to keep an appointment. What may interact with this medication? This medicine may interact with the following medications: warfarin This list may  not describe all possible interactions. Give your health care provider a list of all the medicines, herbs, non-prescription drugs, or dietary supplements you use. Also tell them if you smoke, drink alcohol, or use illegal drugs. Some items may interact with your medicine. What should I watch for while using this medication? Visit your doctor for checks on your progress. This drug may make you feel generally unwell. This is not uncommon, as chemotherapy can affect healthy cells as well as cancer cells. Report any side effects. Continue your course of treatment even though you feel ill unless your doctor tells you to stop. In some cases, you may be given additional medicines to help with side effects. Follow all directions for their use. Call your doctor or health care professional for advice if you get a fever, chills or sore throat, or other symptoms of a cold or flu. Do not treat yourself. This drug  decreases your body's ability to fight infections. Try to avoid being around people who are sick. This medicine may increase your risk to bruise or bleed. Call your doctor or health care professional if you notice any unusual bleeding. Talk to your doctor about your risk of cancer. You may be more at risk for certain types of cancers if you take this medicine. Do not become pregnant while taking this medicine or for at least 6 months after stopping it. Women should inform their doctor if they wish to become pregnant or think they might be pregnant. Women of child-bearing potential will need to have a negative pregnancy test before starting this medicine. There is a potential for serious side effects to an unborn child. Talk to your health care professional or pharmacist for more information. Do not breast-feed an infant while taking this medicine. Men must use a latex condom during sexual contact with a woman while taking this medicine and for at least 4 months after stopping it. A latex condom is needed even if you have had a vasectomy. Contact your doctor right away if your partner becomes pregnant. Do not donate sperm while taking this medicine and for at least 4 months after you stop taking this medicine. Men should inform their doctors if they wish to father a child. This medicine may lower sperm counts. What side effects may I notice from receiving this medication? Side effects that you should report to your doctor or health care professional as soon as possible: allergic reactions like skin rash, itching or hives, swelling of the face, lips, or tongue low blood counts - this medicine may decrease the number of white blood cells, red blood cells, and platelets. You may be at increased risk for infections and bleeding nausea, vomiting redness, blistering, peeling or loosening of the skin, including inside the mouth signs and symptoms of infection like fever; chills; cough; sore throat; pain or  trouble passing urine signs and symptoms of low red blood cells or anemia such as unusually weak or tired; feeling faint or lightheaded; falls; breathing problems unusual bruising or bleeding Side effects that usually do not require medical attention (report to your doctor or health care professional if they continue or are bothersome): changes in taste diarrhea hair loss loss of appetite mouth sores This list may not describe all possible side effects. Call your doctor for medical advice about side effects. You may report side effects to FDA at 1-800-FDA-1088. Where should I keep my medication? This drug is given in a hospital or clinic and will not be stored at home.  NOTE: This sheet is a summary. It may not cover all possible information. If you have questions about this medicine, talk to your doctor, pharmacist, or health care provider.  2023 Elsevier/Gold Standard (2021-05-10 00:00:00)

## 2021-12-30 NOTE — Progress Notes (Signed)
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 [facility]) She is seen urgently due to recent nausea, vomiting, dehydration, headaches and dizziness We will proceed with treatment as scheduled I plan to order CT imaging to assess The patient has very advanced and aggressive cancer I want to make sure these symptoms are not related to disease progression  Low blood pressure Her blood pressure was low due to dehydration It is improved today She is able to hydrate adequately We will continue to assess  New onset of headaches She has new onset of headache and dizziness Due to her aggressive disease, I will order MRI to rule out metastatic cancer to the brain She have no new neurological deficits  Nausea and vomiting She had severe nausea and vomiting causing dehydration and related to poor oral intake She felt better today She will be getting antiemetics today I will assess tomorrow  Orders Placed This Encounter  Procedures   CT CHEST ABDOMEN PELVIS W CONTRAST    12/30/21 PT to come in at 5 pm to start drinking 1st bottle of contrast for  CT scan at 7.  Will drink 2nd bottle after the MRI. Ok'd per [place]. LAS    Standing Status:   Future    Standing Expiration Date:   12/31/2022    Order Specific Question:   Preferred imaging location?    Answer:   [facility]    Order Specific Question:   Radiology Contrast Protocol - do NOT remove file path    Answer:   [email or web address]    Order Specific Question:   Is patient pregnant?    Answer:   No   MR BRAIN W WO CONTRAST    12/30/21 PT to come in at 5 pm to start drinking 1st bottle of contrast for  CT scan at 7.  Will drink 2nd  bottle after the MRI. Ok'd per [place]. LAS    Standing Status:   Future    Standing Expiration Date:   12/31/2022    Order Specific Question:   If indicated for the ordered procedure, I authorize the administration of contrast media per Radiology protocol    Answer:   Yes    Order Specific Question:   What is the patient's sedation requirement?    Answer:   No Sedation    Order Specific Question:   Does the patient have a pacemaker or implanted devices?    Answer:   No    Order Specific Question:   Radiology Contrast Protocol - do NOT remove file path    Answer:   [email or web address]    Order Specific Question:   Preferred imaging location?    Answer:   [facility]    All questions were answered. The patient knows to call the clinic with any problems, questions or concerns. The total time spent in the appointment was 40 minutes encounter with patients including review of chart and various tests results, discussions about plan of care and coordination of care plan   Heath Lark, MD 12/30/2021 11:28 AM  INTERVAL HISTORY: Please see below for problem oriented charting. she is seen in the infusion room Over the weekend, her family called ambulance and she was brought into the emergency department for evaluation She  had nausea, vomiting, dizziness and headaches Her symptoms are somewhat improved but not completely resolved She has no new neurological deficits No recent constipation  REVIEW OF SYSTEMS:   Constitutional: Denies fevers, chills or abnormal weight loss Eyes: Denies blurriness of vision Ears, nose, mouth, throat, and face: Denies mucositis or sore throat Respiratory: Denies cough, dyspnea or wheezes Cardiovascular: Denies palpitation, chest discomfort or lower extremity swelling Skin: Denies abnormal skin rashes Lymphatics: Denies new lymphadenopathy or easy bruising Behavioral/Psych: Mood is stable, no new changes  All other  systems were reviewed with the patient and are negative.  I have reviewed the past medical history, past surgical history, social history and family history with the patient and they are unchanged from previous note.  ALLERGIES:  is allergic to oxycodone.  MEDICATIONS:  Current Outpatient Medications  Medication Sig Dispense Refill   acetaminophen (TYLENOL) 500 MG tablet Take 1,000 mg by mouth every 6 (six) hours as needed for moderate pain or headache.     albuterol (VENTOLIN HFA) 108 (90 Base) MCG/ACT inhaler Inhale 2 puffs into the lungs every 4 (four) hours as needed for wheezing or shortness of breath. 18 g 0   allopurinol (ZYLOPRIM) 300 MG tablet Take 1 tablet (300 mg total) by mouth daily. 30 tablet 1   buPROPion (WELLBUTRIN XL) 300 MG 24 hr tablet Take 300 mg by mouth daily.     citalopram (CELEXA) 40 MG tablet Take 40 mg by mouth daily.      dextromethorphan-guaiFENesin (MUCINEX DM) 30-600 MG 12hr tablet Take 1 tablet by mouth 2 (two) times daily. 30 tablet 0   ibuprofen (ADVIL) 200 MG tablet Take 800 mg by mouth every 6 (six) hours as needed for headache, mild pain or fever.     levalbuterol (XOPENEX) 0.63 MG/3ML nebulizer solution Take 3 mLs (0.63 mg total) by nebulization every 4 (four) hours as needed for wheezing or shortness of breath. 3 mL 12   LORazepam (ATIVAN) 0.5 MG tablet Take 1 tablet (0.5 mg total) by mouth 2 (two) times daily as needed for anxiety. 15 tablet 0   Magnesium 300 MG CAPS Take 300 mg by mouth every evening.     Multiple Vitamins-Minerals (MULTIVITAMIN WITH MINERALS) tablet Take 1 tablet by mouth daily.     ondansetron (ZOFRAN) 8 MG tablet Take 1 tablet (8 mg total) by mouth every 8 (eight) hours as needed for nausea. 30 tablet 3   prochlorperazine (COMPAZINE) 10 MG tablet Take 1 tablet (10 mg total) by mouth every 6 (six) hours as needed for nausea or vomiting. 30 tablet 0   Sodium Chloride Flush (NORMAL SALINE FLUSH) 0.9 % SOLN Please flush 10 ml normal  saline daily 30 mL 3   Sodium Chloride Flush (NORMAL SALINE FLUSH) 0.9 % SOLN Inject 10 mLs into the vein daily. 300 mL 3   traZODone (DESYREL) 50 MG tablet Take 1 tablet (50 mg total) by mouth at bedtime. 30 tablet 0   No current facility-administered medications for this visit.   Facility-Administered Medications Ordered in Other Visits  Medication Dose Route Frequency Provider Last Rate Last Admin   atezolizumab (TECENTRIQ) 1,200 mg in sodium chloride 0.9 % 250 mL chemo infusion  1,200 mg Intravenous Once Alvy Bimler, Dior Dominik, MD 270 mL/hr at 12/30/21 1122 1,200 mg at 12/30/21 1122   CARBOplatin (PARAPLATIN) 630 mg in sodium chloride 0.9 % 250 mL chemo infusion  630 mg Intravenous Once Alvy Bimler, Chyler Creely, MD       etoposide (VEPESID) 180 mg in  sodium chloride 0.9 % 500 mL chemo infusion  100 mg/m2 (Treatment Plan Recorded) Intravenous Once Alvy Bimler, Lerry Cordrey, MD       heparin lock flush 100 unit/mL  500 Units Intracatheter Once PRN Alvy Bimler, Khalil Szczepanik, MD       sodium chloride flush (NS) 0.9 % injection 10 mL  10 mL Intracatheter PRN Alvy Bimler, Shenika Quint, MD        SUMMARY OF ONCOLOGIC HISTORY: Oncology History  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 Negative for acute PE or thoracic aortic dissection. 2. Encasement and narrowing of the right upper lobe pulmonary artery by tumor. 3. Stable bilateral pulmonary nodules, hilar and mediastinal adenopathy. 4. Progressive consolidation at the right lung base   12/04/2021 Imaging   1. A mixed solid and cystic mass replaces the left ovary measuring roughly 6.8 x 11.0 x 9.2 cm in greatest dimension. This may represent a primary ovarian mass or an ovarian metastasis. 2. Multiple pathologically enlarged lymph nodes within the gastrohepatic ligament, consistent with metastatic disease. 3. Innumerable pulmonary masses within the visualized lung bases bilaterally, better evaluated on previously performed dedicated CT imaging of the chest. 4. Small left  pleural effusion. 5. Cholelithiasis without pericholecystic inflammatory change.     12/05/2021 Pathology Results   LUNG, RIGHT LOWER LOBE, NEEDLE CORE BIOPSY:  Non-small cell carcinoma, poorly differentiated with necrosis (see comment)   COMMENT:   Sections show multiple cores of pulmonary parenchyma multifocally infiltrated by sheets and nests of large, atypical cells with variably large round to oval nuclei and scattered small inconspicuous nucleoli.  There are foci of geographic tumor necrosis.  In areas focally the tumor shows features suggestive of a squamous differentiation. Sixteen immunohistochemical stains are performed with adequate control.  The  tumor is positive for pancytokeratin (AE1/AE3).  The tumor also shows patchy positivity for high molecular weight cytokeratin 5/6.  The tumor is negative for cytokeratin 7 and cytokeratin 20.  The tumor is negative for the squamous markers p40 and p63.  The tumor is negative for the pulmonary adeno markers Napsin A and TTF-1.  The tumor is negative for the GYN and renal marker PAX8.  The tumor is negative for the GYN and mesothelial markers WT1 the tumor shows patchy positivity for the neuroendocrine markers synaptophysin and CD56 the tumor also shows variable positivity for p53.  The tumor shows variable strong positivity for p16.  The tumor is negative for estrogen receptor and progesterone receptor.   In summary the cores show a poorly differentiated non-small cell carcinoma with necrosis exhibiting focal neuroendocrine differentiation by immunohistochemistry.  This immunohistochemical pattern does not  suggest a primary for this tumor.  The cytokeratin 5/6 positivity, although commonly used for squamous differentiation within pulmonary tumors, may be seen in high-grade ovarian tumors particularly serous  carcinomas.  Clinically the patient is reported to have a large ovarian mass with multiple pulmonary nodules and extensive abdominal and  mediastinal adenopathy.  Given this history, the p16 and p53 positivity  may be seen in ovarian tumors, but they may also be seen in tumors from other sites.  Also the tumor is negative for the more specific gynecologic markers cytokeratin 7, PAX8 and WT1.  Additional clinical  and radiologic correlation is recommended and an ovarian mass biopsy is suggested.  With additional tissue molecular testing would be possible if needed.    12/05/2021 Procedure   Successful CT-guided core needle biopsy of right lower lung pulmonary nodule/mass.  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 [facility])   12/07/2021 - 12/07/2021 Chemotherapy   Patient is on Treatment Plan : OVARIAN Carboplatin (AUC 6) / Paclitaxel (175) q21d x 6 cycles     12/30/2021 -  Chemotherapy   Patient is on Treatment Plan : LUNG SCLC Carboplatin + Etoposide + Atezolizumab Induction q21d / Atezolizumab Maintenance q21d       PHYSICAL EXAMINATION: ECOG PERFORMANCE STATUS: 1 - Symptomatic but completely ambulatory GENERAL:alert, no distress and comfortable SKIN: skin color, texture, turgor are normal, no rashes or significant lesions EYES: normal, Conjunctiva are pink and non-injected, sclera clear OROPHARYNX:no exudate, no erythema and lips, buccal mucosa, and tongue normal  NECK: supple, thyroid normal size, non-tender, without nodularity LYMPH:  no palpable lymphadenopathy in the cervical, axillary or inguinal LUNGS: clear to auscultation and percussion with normal breathing effort HEART: regular rate & rhythm and no  murmurs and no lower extremity edema ABDOMEN:abdomen soft, non-tender and normal bowel sounds Musculoskeletal:no cyanosis of digits and no clubbing  NEURO: alert & oriented x 3 with fluent speech, no focal motor/sensory deficits  LABORATORY DATA:  I have reviewed the data as listed    Component Value Date/Time   NA 137 12/29/2021 2148   K 3.7 12/29/2021 2148   CL 106 12/29/2021 2148   CO2 22 12/29/2021 2148   GLUCOSE 105 (H) 12/29/2021 2148   BUN 16 12/29/2021 2148   CREATININE 0.61 12/29/2021 2148   CREATININE 0.61 12/27/2021 1439   CALCIUM 9.0 12/29/2021 2148   PROT 7.3 12/29/2021 2148   ALBUMIN 3.3 (L) 12/29/2021 2148   AST 35 12/29/2021 2148   AST 24 12/27/2021 1439   ALT 15 12/29/2021 2148   ALT 14 12/27/2021 1439   ALKPHOS 93 12/29/2021 2148   BILITOT 0.5 12/29/2021 2148   BILITOT 0.3 12/27/2021 1439   GFRNONAA >60 12/29/2021 2148   GFRNONAA >60 12/27/2021 1439    No results found for: "SPEP", "UPEP"  Lab Results  Component Value Date   WBC 8.2 12/29/2021   NEUTROABS 5.3 12/29/2021   HGB 12.0 12/29/2021   HCT 34.5 (L) 12/29/2021   MCV 88.5 12/29/2021   PLT 191 12/29/2021      Chemistry      Component Value Date/Time   NA 137 12/29/2021 2148   K 3.7 12/29/2021 2148   CL 106 12/29/2021 2148   CO2 22 12/29/2021 2148   BUN 16 12/29/2021 2148   CREATININE 0.61 12/29/2021 2148   CREATININE 0.61 12/27/2021 1439      Component Value Date/Time   CALCIUM 9.0 12/29/2021 2148   ALKPHOS 93 12/29/2021 2148   AST 35 12/29/2021 2148   AST 24 12/27/2021 1439   ALT 15 12/29/2021 2148   ALT 14 12/27/2021 1439   BILITOT 0.5 12/29/2021 2148   BILITOT 0.3 12/27/2021 1439       RADIOGRAPHIC STUDIES: I have personally reviewed the radiological images as listed and agreed with the findings in the report. DG CHEST PORT 1 VIEW  Result Date: 12/10/2021 CLINICAL DATA:  Hypoxia. EXAM: PORTABLE CHEST  1 VIEW COMPARISON:  December 06, 2021. FINDINGS: Stable cardiomediastinal  silhouette. Right-sided PICC line is unchanged in position. Stable nodular opacities are noted throughout both lungs consistent with metastatic disease. Small bilateral pleural effusions may be present. Bony thorax is unremarkable. IMPRESSION: Stable nodular opacities are noted throughout both lungs most consistent with metastatic disease. Small pleural effusions may be present. Electronically Signed   By: Marijo Conception M.D.   On: 12/10/2021 08:29   DG Chest Port 1 View  Result Date: 12/06/2021 CLINICAL DATA:  Provided history: Acute respiratory failure with hypoxia. Shortness of breath. EXAM: PORTABLE CHEST 1 VIEW COMPARISON:  Chest radiograph 12/05/2021 and earlier. Chest CT 12/04/2021. FINDINGS: Unchanged position of a right-sided PICC with tip projecting at the level of the superior cavoatrial junction. The cardiomediastinal silhouette is unchanged. Known mediastinal and hilar lymphadenopathy. Again demonstrated are extensive mid to lower lung zone predominant opacities compatible with pulmonary metastatic disease. Superimposed pneumonia cannot be excluded. Findings are similar to the prior chest radiograph of 12/05/2021. A portion of the left lateral costophrenic angle is excluded from the field of view. Small bilateral pleural effusions. No evidence of pneumothorax. IMPRESSION: No significant change from the prior chest radiograph 12/05/2021. Redemonstrated extensive mid-to-lower lung zone predominant opacities compatible with pulmonary metastatic disease. Superimposed pneumonia cannot be excluded. Small bilateral pleural effusions. Electronically Signed   By: Kellie Simmering D.O.   On: 12/06/2021 09:14   CT BIOPSY  Result Date: 12/05/2021 INDICATION: Multiple lung masses EXAM: CT-guided biopsy of right lower lung mass MEDICATIONS: Per EMR ANESTHESIA/SEDATION: Local analgesia FLUOROSCOPY TIME:  N/a COMPLICATIONS: None immediate. PROCEDURE: Informed written consent was obtained from the patient after a  thorough discussion of the procedural risks, benefits and alternatives. All questions were addressed. Maximal Sterile Barrier Technique was utilized including caps, mask, sterile gowns, sterile gloves, sterile drape, hand hygiene and skin antiseptic. A timeout was performed prior to the initiation of the procedure. The patient was placed in the lateral decubitus position with right side down on the CT exam table. Limited CT of the chest was performed for planning purposes. This again demonstrated innumerable bilateral pulmonary masses. Inappropriate right lower lung subpleural nodule/mass was selected as appropriate for percutaneous biopsy. Skin entry site was marked, and the overlying skin was prepped and draped in the standard sterile fashion. Local analgesia was obtained with 1% lidocaine. Using intermittent CT fluoroscopy, a 17 gauge introducer needle was advanced towards the target subpleural nodule in the right lower lobe. Subsequently, core needle biopsy was performed using an 18 gauge core biopsy device x4 total passes. Grossly adequate appearing specimens were submitted in formalin to pathology for further handling. Needle was removed and an occlusive dressing was placed. Postprocedure imaging demonstrated small volume bleeding surrounding the biopsied mass within expected limits and found to be stable over a short monitoring interval. Patient overall tolerated the procedure well, and was transferred to recovery in stable condition. IMPRESSION: Successful CT-guided core needle biopsy of right lower lung pulmonary nodule/mass. Electronically Signed   By: Albin Felling M.D.   On: 12/05/2021 15:02   DG Chest Port 1 View  Result Date: 12/05/2021 CLINICAL DATA:  Right lung biopsy EXAM: PORTABLE CHEST 1 VIEW COMPARISON:  11/26/2021 FINDINGS: Right-sided PICC line terminates at the level of the superior cavoatrial junction. Stable heart size. Low lung volumes. Extensive bilateral airspace opacities compatible  with diffuse pulmonary metastatic disease. Prominence of the mediastinal and hilar borders compatible with underlying lymphadenopathy. Low lung volumes. No pneumothorax identified. Portion  of the lung apices obscured by patient's chin. IMPRESSION: 1. No pneumothorax identified following right lung biopsy. 2. Right-sided PICC line terminates at the level of the superior cavoatrial junction. 3. Extensive bilateral airspace opacities compatible with diffuse pulmonary metastatic disease. Electronically Signed   By: Davina Poke D.O.   On: 12/05/2021 11:29   [place] EKG SITE RITE  Result Date: 12/04/2021 If Site Rite image not attached, placement could not be confirmed due to current cardiac rhythm.  CT Angio Chest Pulmonary Embolism (PE) W or WO Contrast  Result Date: 12/04/2021 CLINICAL DATA:  Chest pain or SOB, pleurisy or effusion suspected EXAM: CT ANGIOGRAPHY CHEST WITH CONTRAST TECHNIQUE: Multidetector CT imaging of the chest was performed using the standard protocol during bolus administration of intravenous contrast. Multiplanar CT image reconstructions and MIPs were obtained to evaluate the vascular anatomy. RADIATION DOSE REDUCTION: This exam was performed according to the departmental dose-optimization program which includes automated exposure control, adjustment of the mA and/or kV according to patient size and/or use of iterative reconstruction technique. CONTRAST:  47m OMNIPAQUE IOHEXOL 350 MG/ML SOLN COMPARISON:  12/03/2021 FINDINGS: Cardiovascular: SVC is patent. Heart size normal. No pericardial effusion. Satisfactory opacification of pulmonary arteries noted, and there is no evidence of pulmonary emboli. There is encasement and narrowing of right upper lobe pulmonary artery branch by tumor. Adequate contrast opacification of the thoracic aorta with no evidence of dissection, aneurysm, or stenosis. There is classic 3-vessel brachiocephalic arch anatomy without proximal stenosis.  Mediastinum/Nodes: Bulky mediastinal and bilateral hilar adenopathy as seen on previous day's exam Lungs/Pleura: Innumerable pulmonary nodules bilaterally as before. Progressive airspace consolidation at the right lung base base. Trace left pleural effusion as before. No pneumothorax. Upper Abdomen: Stable gastrohepatic lymphadenopathy. No acute findings. Musculoskeletal: No chest wall abnormality. No acute or significant osseous findings. Review of the MIP images confirms the above findings. IMPRESSION: 1. Negative for acute PE or thoracic aortic dissection. 2. Encasement and narrowing of the right upper lobe pulmonary artery by tumor. 3. Stable bilateral pulmonary nodules, hilar and mediastinal adenopathy. 4. Progressive consolidation at the right lung base Electronically Signed   By: DLucrezia EuropeM.D.   On: 12/04/2021 10:42   CT ABDOMEN PELVIS W CONTRAST  Result Date: 12/04/2021 CLINICAL DATA:  Weight loss, unintended masses on lung CT, weight loss, lymph involvement. Pulmonary metastatic disease of unknown primary. EXAM: CT ABDOMEN AND PELVIS WITH CONTRAST TECHNIQUE: Multidetector CT imaging of the abdomen and pelvis was performed using the standard protocol following bolus administration of intravenous contrast. RADIATION DOSE REDUCTION: This exam was performed according to the departmental dose-optimization program which includes automated exposure control, adjustment of the mA and/or kV according to patient size and/or use of iterative reconstruction technique. CONTRAST:  849mOMNIPAQUE IOHEXOL 300 MG/ML  SOLN COMPARISON:  None. Findings are correlated with CT examination of the chest of 12/03/2021. FINDINGS: Lower chest: Innumerable pulmonary masses are again identified within the visualized lung bases bilaterally, better evaluated on previously performed dedicated CT imaging of the chest. Bulky bilateral pleural metastatic disease noted. Small left pleural effusion again seen. Pericardial lymphadenopathy  noted. Hepatobiliary: No enhancing intrahepatic mass. No intra or extrahepatic biliary ductal dilation. Cholelithiasis without pericholecystic inflammatory change noted. Pancreas: Unremarkable Spleen: Unremarkable Adrenals/Urinary Tract: Adrenal glands are unremarkable. Kidneys are normal, without renal calculi, focal lesion, or hydronephrosis. Bladder is unremarkable. Stomach/Bowel: Stomach is within normal limits. Appendix appears normal. No evidence of bowel wall thickening, distention, or inflammatory changes. Vascular/Lymphatic: Multiple pathologically enlarged lymph nodes are seen  within the gastrohepatic ligament measuring up to 18 mm in short axis diameter at axial image # 23/3. The abdominal vasculature is unremarkable. Reproductive: A mixed solid and cystic mass replaces the left ovary measuring roughly 6.8 x 11.0 x 9.2 cm in greatest dimension. This may represent a primary ovarian mass or an ovarian metastasis. Uterus and right ovary are unremarkable. Other: No abdominal wall hernia. Musculoskeletal: No acute bone abnormality. No lytic or blastic bone lesion. IMPRESSION: 1. A mixed solid and cystic mass replaces the left ovary measuring roughly 6.8 x 11.0 x 9.2 cm in greatest dimension. This may represent a primary ovarian mass or an ovarian metastasis. 2. Multiple pathologically enlarged lymph nodes within the gastrohepatic ligament, consistent with metastatic disease. 3. Innumerable pulmonary masses within the visualized lung bases bilaterally, better evaluated on previously performed dedicated CT imaging of the chest. 4. Small left pleural effusion. 5. Cholelithiasis without pericholecystic inflammatory change. Electronically Signed   By: Fidela Salisbury M.D.   On: 12/04/2021 01:49   CT Chest Wo Contrast  Result Date: 12/03/2021 CLINICAL DATA:  Pneumonia suspected; * Tracking Code: BO * EXAM: CT CHEST WITHOUT CONTRAST TECHNIQUE: Multidetector CT imaging of the chest was performed following the  standard protocol without IV contrast. RADIATION DOSE REDUCTION: This exam was performed according to the departmental dose-optimization program which includes automated exposure control, adjustment of the mA and/or kV according to patient size and/or use of iterative reconstruction technique. COMPARISON:  Radiograph dated November 26, 2021 FINDINGS: Cardiovascular: Normal heart size. Trace pericardial fluid. No coronary artery calcifications. Normal caliber thoracic aorta with no atherosclerotic disease. Mediastinum/Nodes: Esophagus and thyroid are unremarkable. Bulky enlarged mediastinal and bilateral hilar lymph nodes. Reference right upper paratracheal lymph node measuring 2.9 cm in short axis on series 3, image 19. Reference AP window lymph node measuring 2.7 cm in short axis on image 25. Reference right hilar lymph node measuring 2.5 cm in short axis on series 3, image 29. Reference left hilar lymph node measuring 2.4 cm in short axis on image 77. Lungs/Pleura: Central airways are patent. Innumerable bilateral solid pulmonary nodules which are most prominent in the lower lungs reference right middle lobe pulmonary nodule measuring 2.5 x 1.9 cm on series 4, image 92. Reference left lower lobe pulmonary nodule measuring 2.1 x 1.6 cm on image 113. Perifissural/subpleural nodularity and interlobular septal thickening. Small right pleural effusion. Upper Abdomen: Enlarged upper abdominal lymph nodes. Reference gastrohepatic ligament lymph node measuring 1.5 cm in short axis on series 2, image 60. Musculoskeletal: No chest wall mass or suspicious bone lesions identified. IMPRESSION: 1. Innumerable bilateral solid pulmonary nodules which are most prominent in the lower lungs, findings are concerning for metastatic disease. 2. 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 3. Bulky mediastinal bilateral hilar lymph nodes, likely due to nodal metastatic  disease. 4. Enlarged lymph nodes of the upper abdomen, concerning for additional site of nodal disease. 5. Small left pleural effusion. Electronically Signed   By: Yetta Glassman M.D.   On: 12/03/2021 16:46

## 2021-12-30 NOTE — Discharge Instructions (Signed)

## 2021-12-30 NOTE — Assessment & Plan Note (Signed)
She is seen urgently due to recent nausea, vomiting, dehydration, headaches and dizziness We will proceed with treatment as scheduled I plan to order CT imaging to assess The patient has very advanced and aggressive cancer I want to make sure these symptoms are not related to disease progression

## 2021-12-30 NOTE — Progress Notes (Signed)
[facility] Spiritual Care Note  Met "Melanie Ramirez" in infusion for in-person follow-up, bringing my card, Spiritual Care brochure, and a handmade journal as a gesture of encouragement and tool for reflection. Melanie Ramirez was accompanied by her sister Melanie Ramirez, who is a Librarian, academic in [place]. They report very strong support from many people. For that reason, Melanie Ramirez prefers follow-up in infusion. We plan to visit in more detail at a future treatment.   [place], [place], [facility] Pager [phone] Voicemail [phone]

## 2021-12-30 NOTE — Assessment & Plan Note (Signed)
She had severe nausea and vomiting causing dehydration and related to poor oral intake She felt better today She will be getting antiemetics today I will assess tomorrow

## 2021-12-30 NOTE — Assessment & Plan Note (Signed)
Her blood pressure was low due to dehydration It is improved today She is able to hydrate adequately We will continue to assess

## 2021-12-30 NOTE — ED Provider Notes (Signed)
Blood pressure 121/75, pulse 72, temperature 97.6 F (36.4 C), temperature source Oral, resp. rate 20, height '5\' 5"'$  (1.651 m), weight 72.6 kg, last menstrual period 11/22/2021, SpO2 94 %, unknown if currently breastfeeding.  Assuming care from Dr. Maryan Rued.  In short, Melanie Ramirez is a 41 y.o. female with a chief complaint of Near Syncope .  Refer to the original H&P for additional details.  01:51 AM Patient feels much better after treatment.  Tolerating p.o.  Stable for discharge.     Margette Fast, MD 12/30/21 [phone]

## 2021-12-30 NOTE — Assessment & Plan Note (Signed)
She has new onset of headache and dizziness Due to her aggressive disease, I will order MRI to rule out metastatic cancer to the brain She have no new neurological deficits

## 2021-12-31 ENCOUNTER — Inpatient Hospital Stay: Payer: BC Managed Care – PPO

## 2021-12-31 ENCOUNTER — Telehealth: Payer: Self-pay | Admitting: Genetic Counselor

## 2021-12-31 VITALS — BP 106/65 | HR 77 | Temp 97.8°F | Resp 18

## 2021-12-31 DIAGNOSIS — C801 Malignant (primary) neoplasm, unspecified: Secondary | ICD-10-CM

## 2021-12-31 DIAGNOSIS — Z5112 Encounter for antineoplastic immunotherapy: Secondary | ICD-10-CM | POA: Diagnosis not present

## 2021-12-31 DIAGNOSIS — C7800 Secondary malignant neoplasm of unspecified lung: Secondary | ICD-10-CM

## 2021-12-31 DIAGNOSIS — C7802 Secondary malignant neoplasm of left lung: Secondary | ICD-10-CM

## 2021-12-31 DIAGNOSIS — C7801 Secondary malignant neoplasm of right lung: Secondary | ICD-10-CM

## 2021-12-31 MED ORDER — SODIUM CHLORIDE 0.9 % IV SOLN
100.0000 mg/m2 | Freq: Once | INTRAVENOUS | Status: AC
  Administered 2021-12-31: 180 mg via INTRAVENOUS
  Filled 2021-12-31: qty 9

## 2021-12-31 MED ORDER — SODIUM CHLORIDE 0.9 % IV SOLN: Freq: Once | INTRAVENOUS | Status: AC

## 2021-12-31 MED ORDER — HEPARIN SOD (PORK) LOCK FLUSH 100 UNIT/ML IV SOLN
250.0000 [IU] | Freq: Once | INTRAVENOUS | Status: AC | PRN
  Administered 2021-12-31: 250 [IU]

## 2021-12-31 MED ORDER — SODIUM CHLORIDE 0.9 % IV SOLN
10.0000 mg | Freq: Once | INTRAVENOUS | Status: AC
  Administered 2021-12-31: 10 mg via INTRAVENOUS
  Filled 2021-12-31: qty 10

## 2021-12-31 MED ORDER — SODIUM CHLORIDE 0.9% FLUSH
3.0000 mL | INTRAVENOUS | Status: DC | PRN
  Administered 2021-12-31: 3 mL

## 2021-12-31 MED FILL — Dexamethasone Sodium Phosphate Inj 100 MG/10ML: INTRAMUSCULAR | Qty: 1 | Status: AC

## 2021-12-31 NOTE — Telephone Encounter (Signed)
GC appt was rescheduled to 7/25.  LVM offering 7/14 appt around 1:30.  Contact info provided.

## 2022-01-01 ENCOUNTER — Inpatient Hospital Stay: Payer: BC Managed Care – PPO

## 2022-01-01 ENCOUNTER — Encounter: Payer: Self-pay | Admitting: General Practice

## 2022-01-01 VITALS — BP 106/69 | HR 76 | Temp 97.9°F | Resp 18

## 2022-01-01 DIAGNOSIS — C801 Malignant (primary) neoplasm, unspecified: Secondary | ICD-10-CM

## 2022-01-01 DIAGNOSIS — C7800 Secondary malignant neoplasm of unspecified lung: Secondary | ICD-10-CM

## 2022-01-01 DIAGNOSIS — Z5112 Encounter for antineoplastic immunotherapy: Secondary | ICD-10-CM | POA: Diagnosis not present

## 2022-01-01 DIAGNOSIS — C7801 Secondary malignant neoplasm of right lung: Secondary | ICD-10-CM

## 2022-01-01 MED ORDER — SODIUM CHLORIDE 0.9% FLUSH
10.0000 mL | INTRAVENOUS | Status: DC | PRN
  Administered 2022-01-01: 10 mL

## 2022-01-01 MED ORDER — HEPARIN SOD (PORK) LOCK FLUSH 100 UNIT/ML IV SOLN
250.0000 [IU] | Freq: Once | INTRAVENOUS | Status: AC | PRN
  Administered 2022-01-01: 250 [IU]

## 2022-01-01 MED ORDER — SODIUM CHLORIDE 0.9 % IV SOLN
100.0000 mg/m2 | Freq: Once | INTRAVENOUS | Status: AC
  Administered 2022-01-01: 180 mg via INTRAVENOUS
  Filled 2022-01-01: qty 9

## 2022-01-01 MED ORDER — SODIUM CHLORIDE 0.9 % IV SOLN
10.0000 mg | Freq: Once | INTRAVENOUS | Status: AC
  Administered 2022-01-01: 10 mg via INTRAVENOUS
  Filled 2022-01-01: qty 10

## 2022-01-01 MED ORDER — SODIUM CHLORIDE 0.9 % IV SOLN: Freq: Once | INTRAVENOUS | Status: AC

## 2022-01-01 NOTE — Progress Notes (Signed)
[facility] Spiritual Care Note  Followed up with Melanie Ramirez in infusion, meeting her dad, too. She was in good spirits, noting that she felt much better than during our previous encounter. She remains buoyed up by friends and family, and is aware of ongoing chaplain availability should needs arise or circumstances change.   [place], [place], [facility] Pager [phone] Voicemail [phone]

## 2022-01-01 NOTE — Patient Instructions (Signed)
[facility]COL[facility]  Discharge Instructions: Thank you for choosing [facility] to provide your oncology and hematology care.   If you have a lab appointment with the Cancer Center, please go directly to the Cancer Center and check in at the registration area.   Wear comfortable clothing and clothing appropriate for easy access to any Portacath or PICC line.   We strive to give you quality time with your provider. You may need to reschedule your appointment if you arrive late (15 or more minutes).  Arriving late affects you and other patients whose appointments are after yours.  Also, if you miss three or more appointments without notifying the office, you may be dismissed from the clinic at the provider's discretion.      For prescription refill requests, have your pharmacy contact our office and allow 72 hours for refills to be completed.    Today you received the following chemotherapy and/or immunotherapy agents: Etoposide      To help prevent nausea and vomiting after your treatment, we encourage you to take your nausea medication as directed.  BELOW ARE SYMPTOMS THAT SHOULD BE REPORTED IMMEDIATELY: *FEVER GREATER THAN 100.4 F (38 C) OR HIGHER *CHILLS OR SWEATING *NAUSEA AND VOMITING THAT IS NOT CONTROLLED WITH YOUR NAUSEA MEDICATION *UNUSUAL SHORTNESS OF BREATH *UNUSUAL BRUISING OR BLEEDING *URINARY PROBLEMS (pain or burning when urinating, or frequent urination) *BOWEL PROBLEMS (unusual diarrhea, constipation, pain near the anus) TENDERNESS IN MOUTH AND THROAT WITH OR WITHOUT PRESENCE OF ULCERS (sore throat, sores in mouth, or a toothache) UNUSUAL RASH, SWELLING OR PAIN  UNUSUAL VAGINAL DISCHARGE OR ITCHING   Items with * indicate a potential emergency and should be followed up as soon as possible or go to the Emergency Department if any problems should occur.  Please show the CHEMOTHERAPY ALERT CARD or IMMUNOTHERAPY ALERT CARD at check-in to  the Emergency Department and triage nurse.  Should you have questions after your visit or need to cancel or reschedule your appointment, please contact [facility]  Dept: [phone]  and follow the prompts.  Office hours are 8:00 a.m. to 4:30 p.m. Monday - Friday. Please note that voicemails left after 4:00 p.m. may not be returned until the following business day.  We are closed weekends and major holidays. You have access to a nurse at all times for urgent questions. Please call the main number to the clinic Dept: [phone] and follow the prompts.   For any non-urgent questions, you may also contact your provider using MyChart. We now offer e-Visits for anyone 18 and older to request care online for non-urgent symptoms. For details visit [email or web address].   Also download the MyChart app! Go to the app store, search "MyChart", open the app, select [facility], and log in with your MyChart username and password.  Masks are optional in the cancer centers. If you would like for your care team to wear a mask while they are taking care of you, please let them know. For doctor visits, patients may have with them one support person who is at least 40 years old. At this time, visitors are not allowed in the infusion area. 

## 2022-01-02 ENCOUNTER — Ambulatory Visit (HOSPITAL_BASED_OUTPATIENT_CLINIC_OR_DEPARTMENT_OTHER)
Admission: RE | Admit: 2022-01-02 | Discharge: 2022-01-02 | Disposition: A | Discharge status: Home / Self Care | Payer: BC Managed Care – PPO | Source: Ambulatory Visit | Attending: Hematology and Oncology | Admitting: Hematology and Oncology

## 2022-01-02 DIAGNOSIS — C7802 Secondary malignant neoplasm of left lung: Secondary | ICD-10-CM | POA: Diagnosis not present

## 2022-01-02 DIAGNOSIS — C7800 Secondary malignant neoplasm of unspecified lung: Secondary | ICD-10-CM

## 2022-01-02 DIAGNOSIS — C7801 Secondary malignant neoplasm of right lung: Secondary | ICD-10-CM | POA: Diagnosis not present

## 2022-01-02 DIAGNOSIS — C801 Malignant (primary) neoplasm, unspecified: Secondary | ICD-10-CM | POA: Insufficient documentation

## 2022-01-02 MED ORDER — GADOBUTROL 1 MMOL/ML IV SOLN
7.0000 mL | Freq: Once | INTRAVENOUS | Status: AC | PRN
  Administered 2022-01-02: 7 mL via INTRAVENOUS
  Filled 2022-01-02: qty 7.5

## 2022-01-02 MED ORDER — IOHEXOL 300 MG/ML  SOLN
100.0000 mL | Freq: Once | INTRAMUSCULAR | Status: AC | PRN
  Administered 2022-01-02: 100 mL via INTRAVENOUS

## 2022-01-03 ENCOUNTER — Other Ambulatory Visit: Payer: Self-pay | Admitting: Radiation Therapy

## 2022-01-03 ENCOUNTER — Other Ambulatory Visit (HOSPITAL_COMMUNITY): Payer: Self-pay

## 2022-01-03 ENCOUNTER — Encounter: Payer: BC Managed Care – PPO | Admitting: Genetic Counselor

## 2022-01-03 ENCOUNTER — Other Ambulatory Visit: Payer: Self-pay | Admitting: Hematology and Oncology

## 2022-01-03 ENCOUNTER — Telehealth: Payer: Self-pay | Admitting: *Deleted

## 2022-01-03 ENCOUNTER — Inpatient Hospital Stay: Payer: BC Managed Care – PPO

## 2022-01-03 ENCOUNTER — Inpatient Hospital Stay (HOSPITAL_BASED_OUTPATIENT_CLINIC_OR_DEPARTMENT_OTHER): Payer: BC Managed Care – PPO | Admitting: Hematology and Oncology

## 2022-01-03 ENCOUNTER — Encounter: Payer: Self-pay | Admitting: Hematology and Oncology

## 2022-01-03 ENCOUNTER — Other Ambulatory Visit: Payer: Self-pay

## 2022-01-03 VITALS — BP 101/63 | HR 90 | Temp 97.7°F | Resp 16

## 2022-01-03 DIAGNOSIS — E039 Hypothyroidism, unspecified: Secondary | ICD-10-CM

## 2022-01-03 DIAGNOSIS — C7931 Secondary malignant neoplasm of brain: Secondary | ICD-10-CM | POA: Diagnosis not present

## 2022-01-03 DIAGNOSIS — C7801 Secondary malignant neoplasm of right lung: Secondary | ICD-10-CM | POA: Diagnosis not present

## 2022-01-03 DIAGNOSIS — C7802 Secondary malignant neoplasm of left lung: Secondary | ICD-10-CM

## 2022-01-03 DIAGNOSIS — Z5112 Encounter for antineoplastic immunotherapy: Secondary | ICD-10-CM | POA: Diagnosis not present

## 2022-01-03 DIAGNOSIS — Z7189 Other specified counseling: Secondary | ICD-10-CM

## 2022-01-03 DIAGNOSIS — C7800 Secondary malignant neoplasm of unspecified lung: Secondary | ICD-10-CM

## 2022-01-03 LAB — CBC WITH DIFFERENTIAL (CANCER CENTER ONLY)
Abs Immature Granulocytes: 0.03 10*3/uL (ref 0.00–0.07)
Basophils Absolute: 0 10*3/uL (ref 0.0–0.1)
Basophils Relative: 0 %
Eosinophils Absolute: 0 10*3/uL (ref 0.0–0.5)
Eosinophils Relative: 1 %
HCT: 32.5 % — ABNORMAL LOW (ref 36.0–46.0)
Hemoglobin: 11.4 g/dL — ABNORMAL LOW (ref 12.0–15.0)
Immature Granulocytes: 1 %
Lymphocytes Relative: 20 %
Lymphs Abs: 1.1 10*3/uL (ref 0.7–4.0)
MCH: 30.8 pg (ref 26.0–34.0)
MCHC: 35.1 g/dL (ref 30.0–36.0)
MCV: 87.8 fL (ref 80.0–100.0)
Monocytes Absolute: 0.2 10*3/uL (ref 0.1–1.0)
Monocytes Relative: 3 %
Neutro Abs: 4 10*3/uL (ref 1.7–7.7)
Neutrophils Relative %: 75 %
Platelet Count: 206 10*3/uL (ref 150–400)
RBC: 3.7 MIL/uL — ABNORMAL LOW (ref 3.87–5.11)
RDW: 13.7 % (ref 11.5–15.5)
WBC Count: 5.4 10*3/uL (ref 4.0–10.5)
nRBC: 0 % (ref 0.0–0.2)

## 2022-01-03 LAB — CMP (CANCER CENTER ONLY)
ALT: 14 U/L (ref 0–44)
AST: 18 U/L (ref 15–41)
Albumin: 3.7 g/dL (ref 3.5–5.0)
Alkaline Phosphatase: 97 U/L (ref 38–126)
Anion gap: 7 (ref 5–15)
BUN: 25 mg/dL — ABNORMAL HIGH (ref 6–20)
CO2: 25 mmol/L (ref 22–32)
Calcium: 8.7 mg/dL — ABNORMAL LOW (ref 8.9–10.3)
Chloride: 104 mmol/L (ref 98–111)
Creatinine: 0.62 mg/dL (ref 0.44–1.00)
GFR, Estimated: >60 mL/min (ref >60)
Glucose, Bld: 98 mg/dL (ref 70–99)
Potassium: 3.8 mmol/L (ref 3.5–5.1)
Sodium: 136 mmol/L (ref 135–145)
Total Bilirubin: 0.3 mg/dL (ref 0.3–1.2)
Total Protein: 6.9 g/dL (ref 6.5–8.1)

## 2022-01-03 LAB — T4, FREE: Free T4: 1.13 ng/dL — ABNORMAL HIGH (ref 0.61–1.12)

## 2022-01-03 LAB — TSH: TSH: 1.538 u[IU]/mL (ref 0.350–4.500)

## 2022-01-03 LAB — GENETIC SCREENING ORDER

## 2022-01-03 MED ORDER — SODIUM CHLORIDE 0.9% FLUSH
10.0000 mL | Freq: Once | INTRAVENOUS | Status: AC
  Administered 2022-01-03: 10 mL

## 2022-01-03 MED ORDER — PEGFILGRASTIM-CBQV 6 MG/0.6ML ~~LOC~~ SOSY
6.0000 mg | PREFILLED_SYRINGE | Freq: Once | SUBCUTANEOUS | Status: AC
  Administered 2022-01-03: 6 mg via SUBCUTANEOUS
  Filled 2022-01-03: qty 0.6

## 2022-01-03 MED ORDER — DEXAMETHASONE 4 MG PO TABS
4.0000 mg | ORAL_TABLET | Freq: Three times a day (TID) | ORAL | 1 refills | Status: DC
Start: 2022-01-03 — End: 2022-01-24
  Filled 2022-01-03: qty 90, 30d supply, fill #0

## 2022-01-03 MED ORDER — HEPARIN SOD (PORK) LOCK FLUSH 100 UNIT/ML IV SOLN
500.0000 [IU] | Freq: Once | INTRAVENOUS | Status: AC
  Administered 2022-01-03: 500 [IU]

## 2022-01-03 NOTE — Assessment & Plan Note (Addendum)
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

## 2022-01-03 NOTE — Telephone Encounter (Signed)
Late Entry [employer] paperwork completed by this nurse 12/30/2021 to provider for review and signature.  01/01/2022 This nurse received signed [employer] form.  Returned via fax to [employer] included 12/20/2021 initial encounter note and, notes for 12/27/2021 and 12/30/2021.  Copy to bin designated for the [facility]Medical Records Department to complete remaining records requested.    01/03/2022   Process completed original copy to alphabetical file folder in appointment registration area near representative number one for pick up.  No further instructions received, actions required or performed by this nurse.

## 2022-01-03 NOTE — Assessment & Plan Note (Addendum)
I have reviewed multiple imaging studies with the patient and her husband CT imaging showed mixed response to treatment, with positive response in the lung but signs of new disease in her liver Imaging of her brain review intracranial metastases  I suspect her intracranial metastasis is not new but likely has progressed because her initial chemotherapy was not adequate to control progression of disease Her treatment is switched to small cell regimen this week I will refer her to see radiation oncologist for further management Continue supportive care

## 2022-01-03 NOTE — Assessment & Plan Note (Addendum)
We discussed overall poor prognosis with intracranial metastasis

## 2022-01-03 NOTE — Progress Notes (Signed)
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 [facility]) I have reviewed multiple imaging studies with the patient and her husband CT imaging showed mixed response to treatment, with positive response in the lung but signs of new disease in her liver Imaging of her brain review intracranial metastases  I suspect her intracranial metastasis is not new but likely has progressed because her initial chemotherapy was not adequate to control progression of disease Her treatment is switched to small cell regimen this week I will refer her to see radiation oncologist for further management Continue supportive care  Metastasis to brain [facility]) 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 brain revealed bilateral intracranial metastases This is the cause of her recent weakness, nausea and headaches I have reviewed her imaging studies with neuro oncologist Her case will be presented at neuro-oncology/brain conference on Monday morning I will proceed to start her on dexamethasone 3 times a day with future taper to be directed by neuro oncologist She was started whole brain radiation treatment next week We discussed briefly side effects to be expected with dexamethasone  Goals of care, counseling/discussion We discussed overall poor prognosis with intracranial metastasis  Orders Placed This Encounter  Procedures   Amb Referral to Neuro Oncology    Referral Priority:   Urgent    Referral Type:   Consultation    Referral Reason:   Specialty Services Required    Number of Visits Requested:   1    All questions were answered. The patient knows to call the clinic with any problems, questions or concerns. The total time spent in the appointment was 40 minutes  encounter with patients including review of chart and various tests results, discussions about plan of care and coordination of care plan   Heath Lark, MD 01/03/2022 4:03 PM  INTERVAL HISTORY: Please see below for problem oriented charting. she returns to review test results She felt slightly better since last time I saw her No recent vomiting or new neurological deficits She is here accompanied by her husband We spent majority of her time reviewing imaging studies and discussed management  REVIEW OF SYSTEMS:     All other systems were reviewed with the patient and are negative.  I have reviewed the past medical history, past surgical history, social history and family history with the patient and they are unchanged from previous note.  ALLERGIES:  is allergic to oxycodone.  MEDICATIONS:  Current Outpatient Medications  Medication Sig Dispense Refill   dexamethasone (DECADRON) 4 MG tablet Take 1 tablet (4 mg total) by mouth 3 (three) times daily. 90 tablet 1   acetaminophen (TYLENOL) 500 MG tablet Take 1,000 mg by mouth every 6 (six) hours as needed for moderate pain or headache.     albuterol (VENTOLIN HFA) 108 (90 Base) MCG/ACT inhaler Inhale 2 puffs into the lungs every 4 (four) hours as needed for wheezing or shortness of breath. 18 g 0   allopurinol (ZYLOPRIM) 300 MG tablet Take 1 tablet (300 mg total) by mouth daily. 30 tablet 1   buPROPion (WELLBUTRIN XL) 300 MG 24 hr tablet Take 300 mg by mouth daily.     citalopram (CELEXA) 40 MG tablet Take 40 mg by mouth daily.      dextromethorphan-guaiFENesin ([place] DM) 30-600  MG 12hr tablet Take 1 tablet by mouth 2 (two) times daily. 30 tablet 0   ibuprofen (ADVIL) 200 MG tablet Take 800 mg by mouth every 6 (six) hours as needed for headache, mild pain or fever.     levalbuterol (XOPENEX) 0.63 MG/3ML nebulizer solution Take 3 mLs (0.63 mg total) by nebulization every 4 (four) hours as needed for wheezing or shortness of breath. 3 mL 12    LORazepam (ATIVAN) 0.5 MG tablet Take 1 tablet (0.5 mg total) by mouth 2 (two) times daily as needed for anxiety. 15 tablet 0   Magnesium 300 MG CAPS Take 300 mg by mouth every evening.     Multiple Vitamins-Minerals (MULTIVITAMIN WITH MINERALS) tablet Take 1 tablet by mouth daily.     ondansetron (ZOFRAN) 8 MG tablet Take 1 tablet (8 mg total) by mouth every 8 (eight) hours as needed for nausea. 30 tablet 3   prochlorperazine (COMPAZINE) 10 MG tablet Take 1 tablet (10 mg total) by mouth every 6 (six) hours as needed for nausea or vomiting. 30 tablet 0   Sodium Chloride Flush (NORMAL SALINE FLUSH) 0.9 % SOLN Please flush 10 ml normal saline daily 30 mL 3   Sodium Chloride Flush (NORMAL SALINE FLUSH) 0.9 % SOLN Inject 10 mLs into the vein daily. 300 mL 3   traZODone (DESYREL) 50 MG tablet Take 1 tablet (50 mg total) by mouth at bedtime. 30 tablet 0   No current facility-administered medications for this visit.    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 Negative for acute PE or thoracic aortic dissection. 2. Encasement and narrowing of the right upper lobe pulmonary artery by tumor. 3. Stable bilateral pulmonary nodules, hilar and mediastinal adenopathy. 4. Progressive consolidation at the right lung base   12/04/2021 Imaging   1. A mixed solid and cystic mass replaces the left ovary measuring roughly 6.8 x 11.0 x 9.2 cm in greatest dimension. This may represent a primary ovarian mass or an ovarian metastasis. 2. Multiple pathologically enlarged lymph nodes within the gastrohepatic ligament, consistent with metastatic disease. 3. Innumerable pulmonary masses within the visualized lung bases bilaterally, better evaluated on previously performed dedicated CT imaging of the chest. 4. Small left pleural effusion. 5. Cholelithiasis without pericholecystic inflammatory change.     12/05/2021 Pathology Results    LUNG, RIGHT LOWER LOBE, NEEDLE CORE BIOPSY:  Non-small cell carcinoma, poorly differentiated with necrosis (see comment)   COMMENT:   Sections show multiple cores of pulmonary parenchyma multifocally infiltrated by sheets and nests of large, atypical cells with variably large round to oval nuclei and scattered small inconspicuous nucleoli.  There are foci of geographic tumor necrosis.  In areas focally the tumor shows features suggestive of a squamous differentiation. Sixteen immunohistochemical stains are performed with adequate control.  The  tumor is positive for pancytokeratin (AE1/AE3).  The tumor also shows patchy positivity for high molecular weight cytokeratin 5/6.  The tumor is negative for cytokeratin 7 and cytokeratin 20.  The tumor is negative for the squamous markers p40 and p63.  The tumor is negative for the pulmonary adeno markers Napsin A and TTF-1.  The tumor is negative for the GYN and renal marker PAX8.  The tumor is negative for the GYN and mesothelial markers WT1 the tumor shows patchy positivity for the neuroendocrine markers synaptophysin and CD56 the tumor also shows variable positivity for p53.  The tumor shows variable  strong positivity for p16.  The tumor is negative for estrogen receptor and progesterone receptor.   In summary the cores show a poorly differentiated non-small cell carcinoma with necrosis exhibiting focal neuroendocrine differentiation by immunohistochemistry.  This immunohistochemical pattern does not  suggest a primary for this tumor.  The cytokeratin 5/6 positivity, although commonly used for squamous differentiation within pulmonary tumors, may be seen in high-grade ovarian tumors particularly serous  carcinomas.  Clinically the patient is reported to have a large ovarian mass with multiple pulmonary nodules and extensive abdominal and mediastinal adenopathy.  Given this history, the p16 and p53 positivity  may be seen in ovarian tumors, but they may also  be seen in tumors from other sites.  Also the tumor is negative for the more specific gynecologic markers cytokeratin 7, PAX8 and WT1.  Additional clinical  and radiologic correlation is recommended and an ovarian mass biopsy is suggested.  With additional tissue molecular testing would be possible if needed.    12/05/2021 Procedure   Successful CT-guided core needle biopsy of right lower lung pulmonary nodule/mass.    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 Potential mixed response to interval therapy. The mediastinal adenopathy and extensive pulmonary nodularity demonstrated previously have improved in the interval. 2. There are multiple newly visible low-density hepatic lesions which are suspicious for metastatic disease. These may be visible due to interval treatment or reflect new lesions. 3. The complex solid and cystic adnexal mass has mildly enlarged from the prior study (primarily the cystic right-sided component). 4. Upper abdominal adenopathy has not significantly changed. 5. No acute findings identified.     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 [facility])   12/07/2021 - 12/07/2021 Chemotherapy   Patient is on Treatment Plan : OVARIAN Carboplatin (AUC 6) / Paclitaxel (175) q21d x 6 cycles     12/30/2021 -  Chemotherapy   Patient is on Treatment Plan : LUNG SCLC Carboplatin + Etoposide + Atezolizumab Induction q21d / Atezolizumab Maintenance q21d       PHYSICAL EXAMINATION: ECOG PERFORMANCE STATUS: 1 - Symptomatic but completely ambulatory GENERAL:alert, no distress and  comfortable NEURO: alert & oriented x 3 with fluent speech, no focal motor/sensory deficits  LABORATORY DATA:  I have reviewed the data as listed    Component Value Date/Time   NA 136 01/03/2022 1426   K 3.8 01/03/2022 1426   CL 104 01/03/2022 1426   CO2 25 01/03/2022 1426   GLUCOSE 98 01/03/2022 1426   BUN 25 (H) 01/03/2022 1426   CREATININE 0.62 01/03/2022 1426   CALCIUM 8.7 (L) 01/03/2022 1426   PROT 6.9 01/03/2022 1426   ALBUMIN 3.7 01/03/2022 1426   AST 18 01/03/2022 1426   ALT 14 01/03/2022 1426   ALKPHOS 97 01/03/2022 1426   BILITOT 0.3 01/03/2022 1426   GFRNONAA >60 01/03/2022 1426    No results found for: "SPEP", "UPEP"  Lab Results  Component Value Date   WBC 5.4 01/03/2022   NEUTROABS 4.0 01/03/2022   HGB 11.4 (L) 01/03/2022   HCT 32.5 (L) 01/03/2022   MCV 87.8  01/03/2022   PLT 206 01/03/2022      Chemistry      Component Value Date/Time   NA 136 01/03/2022 1426   K 3.8 01/03/2022 1426   CL 104 01/03/2022 1426   CO2 25 01/03/2022 1426   BUN 25 (H) 01/03/2022 1426   CREATININE 0.62 01/03/2022 1426      Component Value Date/Time   CALCIUM 8.7 (L) 01/03/2022 1426   ALKPHOS 97 01/03/2022 1426   AST 18 01/03/2022 1426   ALT 14 01/03/2022 1426   BILITOT 0.3 01/03/2022 1426       RADIOGRAPHIC STUDIES: I have reviewed multiple imaging studies with the patient and her husband I have personally reviewed the radiological images as listed and agreed with the findings in the report. CT CHEST ABDOMEN PELVIS W CONTRAST  Result Date: 01/03/2022 CLINICAL DATA:  Small cell lung cancer. Assess treatment response. Patient reports weakness, dizziness and shortness of breath. * Tracking Code: BO * EXAM: CT CHEST, ABDOMEN, AND PELVIS WITH CONTRAST TECHNIQUE: Multidetector CT imaging of the chest, abdomen and pelvis was performed following the standard protocol during bolus administration of intravenous contrast. RADIATION DOSE REDUCTION: This exam was performed  according to the departmental dose-optimization program which includes automated exposure control, adjustment of the mA and/or kV according to patient size and/or use of iterative reconstruction technique. CONTRAST:  126mL OMNIPAQUE IOHEXOL 300 MG/ML  SOLN COMPARISON:  Prior CTs 12/04/2021. FINDINGS: CT CHEST FINDINGS Cardiovascular: No acute vascular findings are seen, although there is suboptimal opacification of pulmonary arteries. A right arm PICC extends to the superior cavoatrial junction. The heart size is normal. There is no pericardial effusion. Mediastinum/Nodes: Previously demonstrated bulky mediastinal and hilar adenopathy appears mildly improved from the previous study. A right paratracheal nodal mass measures 4.1 x 2.6 cm on image 20/2 (previously 4.7 x 3.2 cm). A subcarinal node has a short axis dimension of 2.0 cm on image 31/2 (previously 3.4 cm). No axillary adenopathy. The thyroid gland, trachea and esophagus demonstrate no significant findings. Lungs/Pleura: Trace right pleural effusion. No pneumothorax. Extensive pulmonary nodularity bilaterally has moderately improved in the interval. A representative nodule in the right lower lobe currently measures 1.3 cm on image 73/5 (previously 2.7 cm). There are less confluent nodules and adjacent airspace disease in both lower lobes and the right middle lobe. No enlarging lung masses identified. Musculoskeletal/Chest wall: No chest wall mass or suspicious osseous findings. CT ABDOMEN AND PELVIS FINDINGS Hepatobiliary: There are numerous new low-density lesions throughout the liver. Representative lesions include a 1.5 x 1.2 cm lesion in the right lobe (segment 6, image 62/2), a 1.1 cm lesion centrally in the right lobe (segment 8, image 54/2) and a 1.6 cm lesion in the left lobe (segment 3, image 60/2). A larger area of ill-defined low-density peripherally in the left lobe measures 3.9 x 1.8 cm on image 57/2. No evidence of gallstones, gallbladder wall  thickening or biliary dilatation. Pancreas: Unremarkable. No pancreatic ductal dilatation or surrounding inflammatory changes. Spleen: Normal in size without focal abnormality. Adrenals/Urinary Tract: Both adrenal glands appear normal. The kidneys appear normal without evidence of urinary tract calculus, suspicious lesion or hydronephrosis. The bladder appears normal for its degree of distention. Stomach/Bowel: Some enteric contrast was administered and has passed into the distal small bowel. The stomach appears unremarkable for its degree of distension. No evidence of bowel wall thickening, distention or surrounding inflammatory change. Prominent stool throughout the colon consistent with constipation. Vascular/Lymphatic: Gastrohepatic ligament adenopathy appears similar to the  previous study, measuring up to 1.4 cm short axis on image 57/2. No progressive abdominopelvic adenopathy. No acute vascular findings. Reproductive: Again demonstrated is a large complex solid and cystic pelvic mass. This previously appeared separate from the right ovary. Currently, the right-sided low-density component abuts the right ovary and has mildly enlarged from the prior study, measuring 8.2 x 5.6 cm on image 107/2 (previously 7.2 x 5.2 cm). The more solid-appearing left-sided component is similar, measuring approximately 6.7 x 6.1 cm on image 106/2. Overall greatest dimension is approximately 8.6 cm on image 106/2 (previously 11.0 cm). Other: No ascites, free air or peritoneal nodularity. Musculoskeletal: No acute or significant osseous findings. IMPRESSION: 1. Potential mixed response to interval therapy. The mediastinal adenopathy and extensive pulmonary nodularity demonstrated previously have improved in the interval. 2. There are multiple newly visible low-density hepatic lesions which are suspicious for metastatic disease. These may be visible due to interval treatment or reflect new lesions. 3. The complex solid and cystic  adnexal mass has mildly enlarged from the prior study (primarily the cystic right-sided component). 4. Upper abdominal adenopathy has not significantly changed. 5. No acute findings identified. Electronically Signed   By: Richardean Sale M.D.   On: 01/03/2022 15:41   DG CHEST PORT 1 VIEW  Result Date: 12/10/2021 CLINICAL DATA:  Hypoxia. EXAM: PORTABLE CHEST 1 VIEW COMPARISON:  December 06, 2021. FINDINGS: Stable cardiomediastinal silhouette. Right-sided PICC line is unchanged in position. Stable nodular opacities are noted throughout both lungs consistent with metastatic disease. Small bilateral pleural effusions may be present. Bony thorax is unremarkable. IMPRESSION: Stable nodular opacities are noted throughout both lungs most consistent with metastatic disease. Small pleural effusions may be present. Electronically Signed   By: Marijo Conception M.D.   On: 12/10/2021 08:29   DG Chest Port 1 View  Result Date: 12/06/2021 CLINICAL DATA:  Provided history: Acute respiratory failure with hypoxia. Shortness of breath. EXAM: PORTABLE CHEST 1 VIEW COMPARISON:  Chest radiograph 12/05/2021 and earlier. Chest CT 12/04/2021. FINDINGS: Unchanged position of a right-sided PICC with tip projecting at the level of the superior cavoatrial junction. The cardiomediastinal silhouette is unchanged. Known mediastinal and hilar lymphadenopathy. Again demonstrated are extensive mid to lower lung zone predominant opacities compatible with pulmonary metastatic disease. Superimposed pneumonia cannot be excluded. Findings are similar to the prior chest radiograph of 12/05/2021. A portion of the left lateral costophrenic angle is excluded from the field of view. Small bilateral pleural effusions. No evidence of pneumothorax. IMPRESSION: No significant change from the prior chest radiograph 12/05/2021. Redemonstrated extensive mid-to-lower lung zone predominant opacities compatible with pulmonary metastatic disease. Superimposed pneumonia  cannot be excluded. Small bilateral pleural effusions. Electronically Signed   By: Kellie Simmering D.O.   On: 12/06/2021 09:14   CT BIOPSY  Result Date: 12/05/2021 INDICATION: Multiple lung masses EXAM: CT-guided biopsy of right lower lung mass MEDICATIONS: Per EMR ANESTHESIA/SEDATION: Local analgesia FLUOROSCOPY TIME:  N/a COMPLICATIONS: None immediate. PROCEDURE: Informed written consent was obtained from the patient after a thorough discussion of the procedural risks, benefits and alternatives. All questions were addressed. Maximal Sterile Barrier Technique was utilized including caps, mask, sterile gowns, sterile gloves, sterile drape, hand hygiene and skin antiseptic. A timeout was performed prior to the initiation of the procedure. The patient was placed in the lateral decubitus position with right side down on the CT exam table. Limited CT of the chest was performed for planning purposes. This again demonstrated innumerable bilateral pulmonary masses. Inappropriate right lower lung subpleural  nodule/mass was selected as appropriate for percutaneous biopsy. Skin entry site was marked, and the overlying skin was prepped and draped in the standard sterile fashion. Local analgesia was obtained with 1% lidocaine. Using intermittent CT fluoroscopy, a 17 gauge introducer needle was advanced towards the target subpleural nodule in the right lower lobe. Subsequently, core needle biopsy was performed using an 18 gauge core biopsy device x4 total passes. Grossly adequate appearing specimens were submitted in formalin to pathology for further handling. Needle was removed and an occlusive dressing was placed. Postprocedure imaging demonstrated small volume bleeding surrounding the biopsied mass within expected limits and found to be stable over a short monitoring interval. Patient overall tolerated the procedure well, and was transferred to recovery in stable condition. IMPRESSION: Successful CT-guided core needle biopsy  of right lower lung pulmonary nodule/mass. Electronically Signed   By: Albin Felling M.D.   On: 12/05/2021 15:02   DG Chest Port 1 View  Result Date: 12/05/2021 CLINICAL DATA:  Right lung biopsy EXAM: PORTABLE CHEST 1 VIEW COMPARISON:  11/26/2021 FINDINGS: Right-sided PICC line terminates at the level of the superior cavoatrial junction. Stable heart size. Low lung volumes. Extensive bilateral airspace opacities compatible with diffuse pulmonary metastatic disease. Prominence of the mediastinal and hilar borders compatible with underlying lymphadenopathy. Low lung volumes. No pneumothorax identified. Portion of the lung apices obscured by patient's chin. IMPRESSION: 1. No pneumothorax identified following right lung biopsy. 2. Right-sided PICC line terminates at the level of the superior cavoatrial junction. 3. Extensive bilateral airspace opacities compatible with diffuse pulmonary metastatic disease. Electronically Signed   By: Davina Poke D.O.   On: 12/05/2021 11:29

## 2022-01-06 ENCOUNTER — Telehealth: Payer: Self-pay | Admitting: Internal Medicine

## 2022-01-06 ENCOUNTER — Other Ambulatory Visit: Payer: Self-pay | Admitting: Hematology and Oncology

## 2022-01-06 ENCOUNTER — Telehealth: Payer: Self-pay | Admitting: Radiation Therapy

## 2022-01-06 ENCOUNTER — Inpatient Hospital Stay: Payer: BC Managed Care – PPO

## 2022-01-06 NOTE — Telephone Encounter (Signed)
I spoke with the patient about her upcoming consult and SIM with Dr. Isidore Moos on Wed 7/19. She plans to be here and was thankful for the call.  Mont Dutton R.T(R)(T) Radiation Special Procedures Navigator

## 2022-01-06 NOTE — Telephone Encounter (Signed)
Scheduled appt per 7/14 referral. Pt is aware of appt date and time. Pt is aware to arrive 15 mins prior to appt time and to bring and updated insurance card. Pt is aware of appt location.   

## 2022-01-07 ENCOUNTER — Other Ambulatory Visit: Payer: Self-pay

## 2022-01-07 ENCOUNTER — Inpatient Hospital Stay (HOSPITAL_BASED_OUTPATIENT_CLINIC_OR_DEPARTMENT_OTHER): Payer: BC Managed Care – PPO | Admitting: Internal Medicine

## 2022-01-07 VITALS — BP 128/85 | HR 94 | Temp 97.9°F | Resp 16 | Ht 65.0 in | Wt 149.4 lb

## 2022-01-07 DIAGNOSIS — C7931 Secondary malignant neoplasm of brain: Secondary | ICD-10-CM

## 2022-01-07 DIAGNOSIS — Z5112 Encounter for antineoplastic immunotherapy: Secondary | ICD-10-CM | POA: Diagnosis not present

## 2022-01-07 NOTE — Progress Notes (Signed)
Radiation Oncology         [phone] ________________________________  Initial Outpatient Consultation  Name: Melanie Ramirez MRN: [id number]  Date: 01/08/2022  DOB: 02/22/1981  SE:[id number], [name], [place]  Heath Lark, MD   REFERRING PHYSICIAN: Heath Lark, MD  DIAGNOSIS: No diagnosis found.  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  HISTORY OF PRESENT ILLNESS::Melanie Ramirez is a 41 y.o. female who presented to the ED on 12/03/21 with the cc of shortness of breath. Prior to presenting to the ED, the patient presented to a walk in clinic for evaluation of a nonproductive cough. CXR performed at that time showed diffuse bilateral pulmonary infiltrates, and she was started on Doxy and Levaquin. Her symptoms continued to persist which also prompted her to present to the ED. Chest CT performed in the ED on 12/03/21 showed bilateral solid pulmonary nodules more predominant in the lower lobes, with perifissural interlobular septal thickening concerning for lymphangitic carcinomatosis, and bulky mediastinal bilateral hilar adenopathy. CT of the abdomen and pelvis performed on 12/04/21 showed  a solid cystic mass replacing the left ovary measuring 6.8 x 11 x 9.2 cm, and multiple enlarged lymph nodes within the gastropathic ligament consistent with metastatic disease.    While admitted, the patient required supplemental O2.   Chest CTA also performed on 12/04/21 showed no evidence of acute PE  or thoracic aortic dissection. CTA however showed encasement and narrowing of the right upper lobe pulmonary artery by the known tumor. CT also showed stable bilateral pulmonary nodules, hilar and mediastinal adenopathy, and a progressive consolidation at the right lung base.   The patient proceeded to undergo CT biopsies of the RLL on 12/05/21 which revealed poorly differentiated non-small cell carcinoma with necrosis; p16 positive.   Given suspicions of metastatic ovarian cancer, the patient was referred to Dr. Alvy Bimler while inpatient for initiation of chemotherapy on 12/07/21 consisting of carboplatin, and paclitaxel. The patient was also evaluation by gyn-onc though no clear primary could be determined on evaluation or biopsy.   Following discharge on 12/14/21, the patient followed up with Dr. Alvy Bimler on 12/20/21. During which time, the patient was noted to have an excellent response to treatment and off oxygen therapy. Dr. Alvy Bimler also discussed the rationale of switching her treatment to manage small cell lung cancer regimen consisting of carboplatin, etoposide and durvalumab which the patient agreed to proceed with (started on 12/30/21).    The patient presented for an urgent visit to Dr. Alvy Bimler on 12/30/21 for evaluation of recent nausea, vomiting, dehydration, headaches and dizziness. (Patient was also seen in the ED for evaluation of these symptoms on 12/29/21).   MRI of the brain for evaluation of her symptoms on 01/02/22 unfortunately revealed innumerable enhancing lesions scattered throughout the brain parenchyma consistent with metastatic disease to the brain (dominant lesions in the left cerebellar hemisphere (2.8 cm), left frontal lobe (1.5 cm) and left thalamus (1.2 cm) with surrounding vasogenic edema, particularly at the left cerebellar hemisphere).   Accordingly, Dr. Alvy Bimler started the patient on  decadron '4mg'$  three times per day, which has led to significant improvement in her  functional status.   CT of the chest abdomen and pelvis with contrast on 01/02/22 showed a potential mixed response to interval therapy. This was demonstrated by improvement of mediastinal adenopathy and extensive pulmonary nodularity, but multiple newly visible low-density hepatic lesions  suspicious for metastatic disease. CT also showed mild enlargement of the complex solid and cystic adnexal mass, and no significant change in upper abdominal adenopathy.   Accordingly, the patient was referred to Dr. Mickeal Skinner yesterday (01/07/22), for further evaluation and management of brain metastases. Following discussion with Dr. Mickeal Skinner, we both recommended whole brain radiation, though potentially fractioned into 14 days at 2.5 Gy instead of 30/10. Dr. Mickeal Skinner also advised decreasing decadron to '4mg'$  BID x5 days, then '4mg'$  daily x5 days, then '2mg'$  daily thereafter if tolerated.  PREVIOUS RADIATION THERAPY: No  PAST MEDICAL HISTORY:  has a past medical history of Active labor (03/21/2011), Anxiety, Depression, Headache(784.0), Pneumonia, and Postpartum care following vaginal delivery (9/28) (03/21/2011).    PAST SURGICAL HISTORY: Past Surgical History:  Procedure Laterality Date   DENTAL SURGERY     DILATION AND EVACUATION N/A 07/29/2013   Procedure: Ultrasound guided Suction DILATATION AND EVACUATION (D&E) 2ND TRIMESTER with tissue sent for chromosome analysis;  Surgeon: Lovenia Kim, MD;  Location: [facility];  Service: Gynecology;  Laterality: N/A;   FOOT SURGERY Left 2018   calcaneus   VENTRAL HERNIA REPAIR N/A 12/08/2018   Procedure: LAPAROSCOPIC VENTRAL WALL HERNIA REPAIR WITH MESH;  Surgeon: Michael Boston, MD;  Location: [facility];  Service: General;  Laterality: N/A;    FAMILY HISTORY: family history includes Depression in her brother, daughter, father, mother, sister, and son.  SOCIAL HISTORY:  reports that she has never smoked. She has never used smokeless tobacco. She reports current alcohol use of  about 1.0 standard drink of alcohol per week. She reports that she does not use drugs.  ALLERGIES: Oxycodone  MEDICATIONS:  Current Outpatient Medications  Medication Sig Dispense Refill   acetaminophen (TYLENOL) 500 MG tablet Take 1,000 mg by mouth every 6 (six) hours as needed for moderate pain or headache.     albuterol (VENTOLIN HFA) 108 (90 Base) MCG/ACT inhaler Inhale 2 puffs into the lungs every 4 (four) hours as needed for wheezing or shortness of breath. 18 g 0   allopurinol (ZYLOPRIM) 300 MG tablet Take 1 tablet (300 mg total) by mouth daily. 30 tablet 1   buPROPion (WELLBUTRIN XL) 300 MG 24 hr tablet Take 300 mg by mouth daily.     citalopram (CELEXA) 40 MG tablet Take 40 mg by mouth daily.      dexamethasone (DECADRON) 4 MG tablet Take 1 tablet (4 mg total) by mouth 3 (three) times daily. 90 tablet 1   dextromethorphan-guaiFENesin (MUCINEX DM) 30-600 MG 12hr tablet Take 1 tablet by mouth 2 (two) times daily. 30 tablet 0   ibuprofen (ADVIL) 200 MG tablet Take 800 mg by mouth every 6 (six) hours as needed for headache, mild pain or fever.     levalbuterol (XOPENEX) 0.63 MG/3ML nebulizer solution Take 3 mLs (0.63 mg total) by nebulization every 4 (four) hours as needed for wheezing or shortness of breath. 3 mL 12   LORazepam (ATIVAN) 0.5 MG tablet Take 1 tablet (0.5 mg total) by mouth 2 (two) times daily as needed for anxiety. 15 tablet 0   Magnesium 300 MG CAPS Take 300 mg by mouth every evening.  Multiple Vitamins-Minerals (MULTIVITAMIN WITH MINERALS) tablet Take 1 tablet by mouth daily.     ondansetron (ZOFRAN) 8 MG tablet Take 1 tablet (8 mg total) by mouth every 8 (eight) hours as needed for nausea. 30 tablet 3   prochlorperazine (COMPAZINE) 10 MG tablet Take 1 tablet (10 mg total) by mouth every 6 (six) hours as needed for nausea or vomiting. 30 tablet 0   Sodium Chloride Flush (NORMAL SALINE FLUSH) 0.9 % SOLN Please flush 10 ml normal saline daily 30 mL 3   Sodium Chloride  Flush (NORMAL SALINE FLUSH) 0.9 % SOLN Inject 10 mLs into the vein daily. 300 mL 3   traZODone (DESYREL) 50 MG tablet Take 1 tablet (50 mg total) by mouth at bedtime. 30 tablet 0   No current facility-administered medications for this encounter.    REVIEW OF SYSTEMS:  Notable for that above.   PHYSICAL EXAM:  vitals were not taken for this visit.   General: Alert and oriented, in no acute distress *** HEENT: Head is normocephalic. Extraocular movements are intact. Oropharynx is clear. Neck: Neck is supple, no palpable cervical or supraclavicular lymphadenopathy. Heart: Regular in rate and rhythm with no murmurs, rubs, or gallops. Chest: Clear to auscultation bilaterally, with no rhonchi, wheezes, or rales. Abdomen: Soft, nontender, nondistended, with no rigidity or guarding. Extremities: No cyanosis or edema. Lymphatics: see Neck Exam Skin: No concerning lesions. Musculoskeletal: symmetric strength and muscle tone throughout. Neurologic: Cranial nerves II through XII are grossly intact. No obvious focalities. Speech is fluent. Coordination is intact. Psychiatric: Judgment and insight are intact. Affect is appropriate.   ECOG = ***  0 - Asymptomatic (Fully active, able to carry on all predisease activities without restriction)  1 - Symptomatic but completely ambulatory (Restricted in physically strenuous activity but ambulatory and able to carry out work of a light or sedentary nature. For example, light housework, office work)  2 - Symptomatic, <50% in bed during the day (Ambulatory and capable of all self care but unable to carry out any work activities. Up and about more than 50% of waking hours)  3 - Symptomatic, >50% in bed, but not bedbound (Capable of only limited self-care, confined to bed or chair 50% or more of waking hours)  4 - Bedbound (Completely disabled. Cannot carry on any self-care. Totally confined to bed or chair)  5 - Death   Eustace Pen MM, Creech RH, Tormey DC, et  al. [phone]). "Toxicity and response criteria of the [facility]". Ollie Oncol. 5 (6): 649-55   LABORATORY DATA:  Lab Results  Component Value Date   WBC 5.4 01/03/2022   HGB 11.4 (L) 01/03/2022   HCT 32.5 (L) 01/03/2022   MCV 87.8 01/03/2022   PLT 206 01/03/2022   CMP     Component Value Date/Time   NA 136 01/03/2022 1426   K 3.8 01/03/2022 1426   CL 104 01/03/2022 1426   CO2 25 01/03/2022 1426   GLUCOSE 98 01/03/2022 1426   BUN 25 (H) 01/03/2022 1426   CREATININE 0.62 01/03/2022 1426   CALCIUM 8.7 (L) 01/03/2022 1426   PROT 6.9 01/03/2022 1426   ALBUMIN 3.7 01/03/2022 1426   AST 18 01/03/2022 1426   ALT 14 01/03/2022 1426   ALKPHOS 97 01/03/2022 1426   BILITOT 0.3 01/03/2022 1426   GFRNONAA >60 01/03/2022 1426         RADIOGRAPHY: MR BRAIN W WO CONTRAST  Result Date: 01/03/2022 CLINICAL DATA:  Small cell carcinoma  metastatic to both lungs (HCC) C78.01, C78.02 (ICD-10-CM). 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 EXAM: MRI HEAD WITHOUT AND WITH CONTRAST TECHNIQUE: Multiplanar, multiecho pulse sequences of the brain and surrounding structures were obtained without and with intravenous contrast. CONTRAST:  80m GADAVIST GADOBUTROL 1 MMOL/ML IV SOLN COMPARISON:  None Available. FINDINGS: Brain: Innumerable enhancing lesions are seen scattered throughout the brain parenchyma including supratentorial and infratentorial compartments, consistent with metastatic disease to the brain. Dominant lesions in the left cerebellar hemisphere (2.8 cm), left frontal lobe (1.5 cm) and left thalamus (1.2 cm) with surrounding vasogenic edema, particularly at the left cerebellar hemisphere with mass effect on the  fourth ventricle without evidence of hydrocephalus or entrapment. No significant midline shift. No acute infarction, hemorrhage or extra-axial collection. Vascular: Normal flow voids. Skull and upper cervical spine: Normal marrow signal. Sinuses/Orbits: Negative. Other: None. IMPRESSION: Innumerable enhancing lesions scattered throughout the brain parenchyma consistent with metastatic disease to the brain. Electronically Signed   By: KPedro EarlsM.D.   On: 01/03/2022 17:13   CT CHEST ABDOMEN PELVIS W CONTRAST  Result Date: 01/03/2022 CLINICAL DATA:  Small cell lung cancer. Assess treatment response. Patient reports weakness, dizziness and shortness of breath. * Tracking Code: BO * EXAM: CT CHEST, ABDOMEN, AND PELVIS WITH CONTRAST TECHNIQUE: Multidetector CT imaging of the chest, abdomen and pelvis was performed following the standard protocol during bolus administration of intravenous contrast. RADIATION DOSE REDUCTION: This exam was performed according to the departmental dose-optimization program which includes automated exposure control, adjustment of the mA and/or kV according to patient size and/or use of iterative reconstruction technique. CONTRAST:  1070mOMNIPAQUE IOHEXOL 300 MG/ML  SOLN COMPARISON:  Prior CTs 12/04/2021. FINDINGS: CT CHEST FINDINGS Cardiovascular: No acute vascular findings are seen, although there is suboptimal opacification of pulmonary arteries. A right arm PICC extends to the superior cavoatrial junction. The heart size is normal. There is no pericardial effusion. Mediastinum/Nodes: Previously demonstrated bulky mediastinal and hilar adenopathy appears mildly improved from the previous study. A right paratracheal nodal mass measures 4.1 x 2.6 cm on image 20/2 (previously 4.7 x 3.2 cm). A subcarinal node has a short axis dimension of 2.0 cm on image 31/2 (previously 3.4 cm). No axillary adenopathy. The thyroid gland, trachea and esophagus demonstrate no significant  findings. Lungs/Pleura: Trace right pleural effusion. No pneumothorax. Extensive pulmonary nodularity bilaterally has moderately improved in the interval. A representative nodule in the right lower lobe currently measures 1.3 cm on image 73/5 (previously 2.7 cm). There are less confluent nodules and adjacent airspace disease in both lower lobes and the right middle lobe. No enlarging lung masses identified. Musculoskeletal/Chest wall: No chest wall mass or suspicious osseous findings. CT ABDOMEN AND PELVIS FINDINGS Hepatobiliary: There are numerous new low-density lesions throughout the liver. Representative lesions include a 1.5 x 1.2 cm lesion in the right lobe (segment 6, image 62/2), a 1.1 cm lesion centrally in the right lobe (segment 8, image 54/2) and a 1.6 cm lesion in the left lobe (segment 3, image 60/2). A larger area of ill-defined low-density peripherally in the left lobe measures 3.9 x 1.8 cm on image 57/2. No evidence of gallstones, gallbladder wall thickening or biliary dilatation. Pancreas: Unremarkable. No pancreatic ductal dilatation or surrounding inflammatory changes. Spleen: Normal in size without focal abnormality.  Adrenals/Urinary Tract: Both adrenal glands appear normal. The kidneys appear normal without evidence of urinary tract calculus, suspicious lesion or hydronephrosis. The bladder appears normal for its degree of distention. Stomach/Bowel: Some enteric contrast was administered and has passed into the distal small bowel. The stomach appears unremarkable for its degree of distension. No evidence of bowel wall thickening, distention or surrounding inflammatory change. Prominent stool throughout the colon consistent with constipation. Vascular/Lymphatic: Gastrohepatic ligament adenopathy appears similar to the previous study, measuring up to 1.4 cm short axis on image 57/2. No progressive abdominopelvic adenopathy. No acute vascular findings. Reproductive: Again demonstrated is a large  complex solid and cystic pelvic mass. This previously appeared separate from the right ovary. Currently, the right-sided low-density component abuts the right ovary and has mildly enlarged from the prior study, measuring 8.2 x 5.6 cm on image 107/2 (previously 7.2 x 5.2 cm). The more solid-appearing left-sided component is similar, measuring approximately 6.7 x 6.1 cm on image 106/2. Overall greatest dimension is approximately 8.6 cm on image 106/2 (previously 11.0 cm). Other: No ascites, free air or peritoneal nodularity. Musculoskeletal: No acute or significant osseous findings. IMPRESSION: 1. Potential mixed response to interval therapy. The mediastinal adenopathy and extensive pulmonary nodularity demonstrated previously have improved in the interval. 2. There are multiple newly visible low-density hepatic lesions which are suspicious for metastatic disease. These may be visible due to interval treatment or reflect new lesions. 3. The complex solid and cystic adnexal mass has mildly enlarged from the prior study (primarily the cystic right-sided component). 4. Upper abdominal adenopathy has not significantly changed. 5. No acute findings identified. Electronically Signed   By: Richardean Sale M.D.   On: 01/03/2022 15:41   DG CHEST PORT 1 VIEW  Result Date: 12/10/2021 CLINICAL DATA:  Hypoxia. EXAM: PORTABLE CHEST 1 VIEW COMPARISON:  December 06, 2021. FINDINGS: Stable cardiomediastinal silhouette. Right-sided PICC line is unchanged in position. Stable nodular opacities are noted throughout both lungs consistent with metastatic disease. Small bilateral pleural effusions may be present. Bony thorax is unremarkable. IMPRESSION: Stable nodular opacities are noted throughout both lungs most consistent with metastatic disease. Small pleural effusions may be present. Electronically Signed   By: Marijo Conception M.D.   On: 12/10/2021 08:29      IMPRESSION/PLAN:***    On date of service, in total, I spent *** minutes  on this encounter. Patient was seen in person.   __________________________________________   Eppie Gibson, MD  This document serves as a record of services personally performed by Eppie Gibson, MD. It was created on her behalf by Roney Mans, a trained medical scribe. The creation of this record is based on the scribe's personal observations and the provider's statements to them. This document has been checked and approved by the attending provider.

## 2022-01-07 NOTE — Progress Notes (Signed)
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  Past/Anticipated chemotherapy by medical oncology, if any:  01/07/2022 --Dr. Cecil Cobbs  We discussed recommended treatment plan with whole brain radiation, though potentially fractioned into 14 days at 2.5 Gy instead of 30/10 per discussion with Dr. Isidore Moos. Decadron should be decreased to '4mg'$  BID x5 days, then '4mg'$  daily x5 days, then '2mg'$  daily thereafter if tolerated. No anti-epileptic warranted at this time.  01/03/2022 --Dr. Heath Lark I have reviewed multiple imaging studies with the patient and her husband CT imaging showed mixed response to treatment, with positive response in the lung but signs of new disease in her liver Imaging of her brain review intracranial metastases  I suspect her intracranial metastasis is not new but likely has progressed because her initial chemotherapy was not adequate to control progression of disease This is the cause of her recent weakness, nausea and headaches Her treatment is switched to small cell regimen this week Next cycle (carboplatin/etoposide + atezolizumab) scheduled for 01/17/22 I will refer her to see radiation oncologist for further management Continue supportive care  Patient's main complaints related to symptomatic tumor(s) are: She initially complained of dizziness, swimmy/drunk feeling, which had persisted for several days before presenting to the ED  Dose of Decadron, if applicable: (Was taking '4mg'$  TID starting 01/03/22); Per Dr. Mickeal Skinner: decreased to '4mg'$  BID x5 days  then '4mg'$  daily x5 days then '2mg'$  daily thereafter if tolerated  Recent neurologic symptoms, if any:  Seizures: Denies and has no history of epilepsy Headaches: Reports constant  discomfort behind her eyes (occasionally more intense on the right side) Nausea: Reports this has resolved since starting the steroids Dizziness/ataxia: Reports it will happen 1-2 times a day when she stands, but resolves after she stays stationary for some time Difficulty with hand coordination: Patient denies (does have shakiness from steroids) Focal numbness/weakness: Patient denies Visual deficits/changes: Patient denies Confusion/Memory deficits: Patient denies  Ambulatory status? Walker? Wheelchair?: Patient is able to ambulate unassisted  SAFETY ISSUES: Prior radiation? No Pacemaker/ICD? No Possible current pregnancy? No--LMP: 11/22/2021 Is the patient on methotrexate? No  Additional Complaints / other details:  Nothing else of note

## 2022-01-07 NOTE — Progress Notes (Signed)
[facility] [place], [place] [address] [phone]   New Patient Evaluation  Date of Service: 01/07/22 Patient Name: Melanie Ramirez Patient MRN: [id number] Patient DOB: 07/19/1980 Provider: Ventura Sellers, MD  Identifying Statement:  Melanie Ramirez is a 41 y.o. 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 Negative for acute PE or thoracic aortic dissection. 2. Encasement and narrowing of the right upper lobe pulmonary artery by tumor. 3. Stable bilateral pulmonary nodules, hilar and mediastinal adenopathy. 4. Progressive consolidation at the right lung base   12/04/2021 Imaging   1. A mixed solid and cystic mass replaces the left ovary measuring roughly 6.8 x 11.0 x 9.2 cm in greatest dimension. This may represent a primary ovarian mass or an ovarian metastasis. 2. Multiple pathologically enlarged lymph nodes within the gastrohepatic ligament, consistent with metastatic disease. 3. Innumerable pulmonary masses within the visualized lung bases bilaterally, better evaluated on previously performed dedicated CT imaging of the chest. 4. Small left pleural effusion. 5. Cholelithiasis without pericholecystic inflammatory change.     12/05/2021 Pathology Results   LUNG, RIGHT LOWER LOBE, NEEDLE CORE BIOPSY:  Non-small cell carcinoma, poorly differentiated with necrosis (see comment)   COMMENT:   Sections show multiple cores of pulmonary parenchyma multifocally infiltrated by sheets and nests of large, atypical cells with variably large round to oval nuclei and scattered small inconspicuous nucleoli.  There are foci of geographic tumor necrosis.  In areas focally the tumor shows features suggestive of a squamous differentiation. Sixteen immunohistochemical stains are performed with adequate control.  The   tumor is positive for pancytokeratin (AE1/AE3).  The tumor also shows patchy positivity for high molecular weight cytokeratin 5/6.  The tumor is negative for cytokeratin 7 and cytokeratin 20.  The tumor is negative for the squamous markers p40 and p63.  The tumor is negative for the pulmonary adeno markers Napsin A and TTF-1.  The tumor is negative for the GYN and renal marker PAX8.  The tumor is negative for the GYN and mesothelial markers WT1 the tumor shows patchy positivity for the neuroendocrine markers synaptophysin and CD56 the tumor also shows variable positivity for p53.  The tumor shows variable strong positivity for p16.  The tumor is negative for estrogen receptor and progesterone receptor.   In summary the cores show a poorly differentiated non-small cell carcinoma with necrosis exhibiting focal neuroendocrine differentiation by immunohistochemistry.  This immunohistochemical pattern does not  suggest a primary for this tumor.  The cytokeratin 5/6 positivity, although commonly used for squamous differentiation within pulmonary tumors, may be seen in high-grade ovarian tumors particularly serous  carcinomas.  Clinically the patient is reported to have a large ovarian mass with multiple pulmonary nodules and extensive abdominal and mediastinal adenopathy.  Given this history, the p16 and p53 positivity  may be seen in ovarian tumors, but they may also be seen in tumors from other sites.  Also the tumor is negative for the more specific gynecologic markers cytokeratin 7, PAX8 and WT1.  Additional clinical  and radiologic correlation is recommended and an ovarian mass biopsy is suggested.  With additional tissue molecular testing would be possible if needed.    12/05/2021 Procedure   Successful CT-guided core needle biopsy of right lower lung pulmonary nodule/mass.    Initial Diagnosis   Small cell carcinoma metastatic  to both lungs ([place])   12/07/2021 -  Chemotherapy   She received 1 dose of  carboplatin and paclitaxel   12/30/2021 -  Chemotherapy   Patient is on Treatment Plan : LUNG SCLC Carboplatin + Etoposide + Atezolizumab Induction q21d / Atezolizumab Maintenance q21d     01/03/2022 Imaging   1. Potential mixed response to interval therapy. The mediastinal adenopathy and extensive pulmonary nodularity demonstrated previously have improved in the interval. 2. There are multiple newly visible low-density hepatic lesions which are suspicious for metastatic disease. These may be visible due to interval treatment or reflect new lesions. 3. The complex solid and cystic adnexal mass has mildly enlarged from the prior study (primarily the cystic right-sided component). 4. Upper abdominal adenopathy has not significantly changed. 5. No acute findings identified.     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  Melanie Ramirez presents today to review recent finding of brain metastases from recently diagnosed lung cancer.  She initially complained of dizziness, swimmy/drunk feeling, which had persisted for several days.  Dr. Alvy Bimler obtained an MRI brain which demonstrated diffuse metastatic burden, with particular involvement of posterior fossa with surrounding edema.  She was started on  decadron 68m three times per day, which has led to significant improvement in functional status.  She now feels at prior baseline.  Recently began chemotherapy with Dr. GAlvy Bimler and plans to meet shortly with Dr. SIsidore Moosto plan radiation treatments.  Medications: Current Outpatient Medications on File Prior to Visit  Medication Sig Dispense Refill   acetaminophen (TYLENOL) 500 MG tablet Take 1,000 mg by mouth every 6 (six) hours as needed for moderate pain or headache.     albuterol (VENTOLIN HFA) 108 (90 Base) MCG/ACT inhaler Inhale 2 puffs into the lungs every 4 (four) hours as needed for wheezing or shortness of breath. 18 g 0   allopurinol (ZYLOPRIM) 300 MG tablet Take 1 tablet (300 mg total) by mouth daily. 30 tablet 1   buPROPion (WELLBUTRIN XL) 300 MG 24 hr tablet Take 300 mg by mouth daily.     citalopram (CELEXA) 40 MG tablet Take 40 mg by mouth daily.      dexamethasone (DECADRON) 4 MG tablet Take 1 tablet (4 mg total) by mouth 3 (three) times daily. 90 tablet 1   dextromethorphan-guaiFENesin (MUCINEX DM) 30-600 MG 12hr tablet Take 1 tablet by mouth 2 (two) times daily. 30 tablet 0   ibuprofen (ADVIL) 200 MG tablet Take 800 mg by mouth every 6 (six) hours as needed for headache, mild pain or fever.     levalbuterol (XOPENEX) 0.63 MG/3ML nebulizer solution Take 3 mLs (0.63 mg total) by nebulization every 4 (four) hours as needed for wheezing or shortness of breath. 3 mL 12   LORazepam (ATIVAN) 0.5 MG tablet Take 1 tablet (  0.5 mg total) by mouth 2 (two) times daily as needed for anxiety. 15 tablet 0   Magnesium 300 MG CAPS Take 300 mg by mouth every evening.     Multiple Vitamins-Minerals (MULTIVITAMIN WITH MINERALS) tablet Take 1 tablet by mouth daily.     ondansetron (ZOFRAN) 8 MG tablet Take 1 tablet (8 mg total) by mouth every 8 (eight) hours as needed for nausea. 30 tablet 3   prochlorperazine (COMPAZINE) 10 MG tablet Take 1 tablet (10 mg total) by mouth every 6 (six) hours as needed  for nausea or vomiting. 30 tablet 0   Sodium Chloride Flush (NORMAL SALINE FLUSH) 0.9 % SOLN Please flush 10 ml normal saline daily 30 mL 3   Sodium Chloride Flush (NORMAL SALINE FLUSH) 0.9 % SOLN Inject 10 mLs into the vein daily. 300 mL 3   traZODone (DESYREL) 50 MG tablet Take 1 tablet (50 mg total) by mouth at bedtime. 30 tablet 0   No current facility-administered medications on file prior to visit.    Allergies:  Allergies  Allergen Reactions   Oxycodone     Had poor tolerance:unable to sleep &  mild hallucination   Past Medical History:  Past Medical History:  Diagnosis Date   Active labor 03/21/2011   Anxiety    Depression    Headache(784.0)    occ. migraines with periods   Pneumonia    as teenager   Postpartum care following vaginal delivery (9/28) 03/21/2011   Past Surgical History:  Past Surgical History:  Procedure Laterality Date   DENTAL SURGERY     DILATION AND EVACUATION N/A 07/29/2013   Procedure: Ultrasound guided Suction DILATATION AND EVACUATION (D&E) 2ND TRIMESTER with tissue sent for chromosome analysis;  Surgeon: Lovenia Kim, MD;  Location: [facility];  Service: Gynecology;  Laterality: N/A;   FOOT SURGERY Left 2018   calcaneus   VENTRAL HERNIA REPAIR N/A 12/08/2018   Procedure: LAPAROSCOPIC VENTRAL WALL HERNIA REPAIR WITH MESH;  Surgeon: Michael Boston, MD;  Location: [facility];  Service: General;  Laterality: N/A;   Social History:  Social History   Socioeconomic History   Marital status: Married    Spouse name: Not on file   Number of children: Not on file   Years of education: Not on file   Highest education level: Not on file  Occupational History   Not on file  Tobacco Use   Smoking status: Never   Smokeless tobacco: Never  Vaping Use   Vaping Use: Never used  Substance and Sexual Activity   Alcohol use: Yes    Alcohol/week: 1.0 standard drink of alcohol    Types: 1 Glasses of wine per week    Comment: weekly   Drug use: No   Sexual  activity: Yes  Other Topics Concern   Not on file  Social History Narrative   Not on file   Social Determinants of Health   Financial Resource Strain: Not on file  Food Insecurity: Not on file  Transportation Needs: Not on file  Physical Activity: Not on file  Stress: Not on file  Social Connections: Not on file  Intimate Partner Violence: Not on file   Family History:  Family History  Problem Relation Age of Onset   Depression Mother    Depression Father    Depression Sister    Depression Brother    Depression Daughter    Depression Son     Review of Systems: Constitutional: Doesn't report fevers, chills or abnormal  weight loss Eyes: Doesn't report blurriness of vision Ears, nose, mouth, throat, and face: Doesn't report sore throat Respiratory: Doesn't report cough, dyspnea or wheezes Cardiovascular: Doesn't report palpitation, chest discomfort  Gastrointestinal:  Doesn't report nausea, constipation, diarrhea GU: Doesn't report incontinence Skin: Doesn't report skin rashes Neurological: Per HPI Musculoskeletal: Doesn't report joint pain Behavioral/Psych: Doesn't report anxiety  Physical Exam: Vitals:   01/07/22 0905  BP: 128/85  Pulse: 94  Resp: 16  Temp: 97.9 F (36.6 C)  SpO2: 97%   KPS: 90. General: Alert, cooperative, pleasant, in no acute distress Head: Normal EENT: No conjunctival injection or scleral icterus.  Lungs: Resp effort normal Cardiac: Regular rate Abdomen: Non-distended abdomen Skin: No rashes cyanosis or petechiae. Extremities: No clubbing or edema  Neurologic Exam: Mental Status: Awake, alert, attentive to examiner. Oriented to self and environment. Language is fluent with intact comprehension.  Cranial Nerves: Visual acuity is grossly normal. Visual fields are full. Extra-ocular movements intact. No ptosis. Face is symmetric Motor: Tone and bulk are normal. Power is full in both arms and legs. Reflexes are symmetric, no pathologic  reflexes present.  Sensory: Intact to light touch Gait: Normal.   Labs: I have reviewed the data as listed    Component Value Date/Time   NA 136 01/03/2022 1426   K 3.8 01/03/2022 1426   CL 104 01/03/2022 1426   CO2 25 01/03/2022 1426   GLUCOSE 98 01/03/2022 1426   BUN 25 (H) 01/03/2022 1426   CREATININE 0.62 01/03/2022 1426   CALCIUM 8.7 (L) 01/03/2022 1426   PROT 6.9 01/03/2022 1426   ALBUMIN 3.7 01/03/2022 1426   AST 18 01/03/2022 1426   ALT 14 01/03/2022 1426   ALKPHOS 97 01/03/2022 1426   BILITOT 0.3 01/03/2022 1426   GFRNONAA >60 01/03/2022 1426   Lab Results  Component Value Date   WBC 5.4 01/03/2022   NEUTROABS 4.0 01/03/2022   HGB 11.4 (L) 01/03/2022   HCT 32.5 (L) 01/03/2022   MCV 87.8 01/03/2022   PLT 206 01/03/2022    Imaging:  MR BRAIN W WO CONTRAST  Result Date: 01/03/2022 CLINICAL DATA:  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 EXAM: MRI HEAD WITHOUT AND WITH CONTRAST TECHNIQUE: Multiplanar, multiecho pulse sequences of the brain and surrounding structures were obtained without and with intravenous contrast. CONTRAST:  63m GADAVIST GADOBUTROL 1 MMOL/ML IV SOLN COMPARISON:  None Available. FINDINGS: Brain: Innumerable enhancing lesions are seen scattered throughout the brain parenchyma including supratentorial and infratentorial compartments, consistent with metastatic disease to the brain. Dominant lesions in the left cerebellar hemisphere (2.8 cm), left frontal lobe (1.5 cm) and left thalamus (1.2 cm) with surrounding vasogenic edema, particularly at the left cerebellar hemisphere with mass effect on the fourth ventricle  without evidence of hydrocephalus or entrapment. No significant midline shift. No acute infarction, hemorrhage or extra-axial collection. Vascular: Normal flow voids. Skull and upper cervical spine: Normal marrow signal. Sinuses/Orbits: Negative. Other: None. IMPRESSION: Innumerable enhancing lesions scattered throughout the brain parenchyma consistent with metastatic disease to the brain. Electronically Signed   By: KPedro EarlsM.D.   On: 01/03/2022 17:13   CT CHEST ABDOMEN PELVIS W CONTRAST  Result Date: 01/03/2022 CLINICAL DATA:  Small cell lung cancer. Assess  treatment response. Patient reports weakness, dizziness and shortness of breath. * Tracking Code: BO * EXAM: CT CHEST, ABDOMEN, AND PELVIS WITH CONTRAST TECHNIQUE: Multidetector CT imaging of the chest, abdomen and pelvis was performed following the standard protocol during bolus administration of intravenous contrast. RADIATION DOSE REDUCTION: This exam was performed according to the departmental dose-optimization program which includes automated exposure control, adjustment of the mA and/or kV according to patient size and/or use of iterative reconstruction technique. CONTRAST:  118m OMNIPAQUE IOHEXOL 300 MG/ML  SOLN COMPARISON:  Prior CTs 12/04/2021. FINDINGS: CT CHEST FINDINGS Cardiovascular: No acute vascular findings are seen, although there is suboptimal opacification of pulmonary arteries. A right arm PICC extends to the superior cavoatrial junction. The heart size is normal. There is no pericardial effusion. Mediastinum/Nodes: Previously demonstrated bulky mediastinal and hilar adenopathy appears mildly improved from the previous study. A right paratracheal nodal mass measures 4.1 x 2.6 cm on image 20/2 (previously 4.7 x 3.2 cm). A subcarinal node has a short axis dimension of 2.0 cm on image 31/2 (previously 3.4 cm). No axillary adenopathy. The thyroid gland, trachea and esophagus demonstrate no significant findings.  Lungs/Pleura: Trace right pleural effusion. No pneumothorax. Extensive pulmonary nodularity bilaterally has moderately improved in the interval. A representative nodule in the right lower lobe currently measures 1.3 cm on image 73/5 (previously 2.7 cm). There are less confluent nodules and adjacent airspace disease in both lower lobes and the right middle lobe. No enlarging lung masses identified. Musculoskeletal/Chest wall: No chest wall mass or suspicious osseous findings. CT ABDOMEN AND PELVIS FINDINGS Hepatobiliary: There are numerous new low-density lesions throughout the liver. Representative lesions include a 1.5 x 1.2 cm lesion in the right lobe (segment 6, image 62/2), a 1.1 cm lesion centrally in the right lobe (segment 8, image 54/2) and a 1.6 cm lesion in the left lobe (segment 3, image 60/2). A larger area of ill-defined low-density peripherally in the left lobe measures 3.9 x 1.8 cm on image 57/2. No evidence of gallstones, gallbladder wall thickening or biliary dilatation. Pancreas: Unremarkable. No pancreatic ductal dilatation or surrounding inflammatory changes. Spleen: Normal in size without focal abnormality. Adrenals/Urinary Tract: Both adrenal glands appear normal. The kidneys appear normal without evidence of urinary tract calculus, suspicious lesion or hydronephrosis. The bladder appears normal for its degree of distention. Stomach/Bowel: Some enteric contrast was administered and has passed into the distal small bowel. The stomach appears unremarkable for its degree of distension. No evidence of bowel wall thickening, distention or surrounding inflammatory change. Prominent stool throughout the colon consistent with constipation. Vascular/Lymphatic: Gastrohepatic ligament adenopathy appears similar to the previous study, measuring up to 1.4 cm short axis on image 57/2. No progressive abdominopelvic adenopathy. No acute vascular findings. Reproductive: Again demonstrated is a large complex  solid and cystic pelvic mass. This previously appeared separate from the right ovary. Currently, the right-sided low-density component abuts the right ovary and has mildly enlarged from the prior study, measuring 8.2 x 5.6 cm on image 107/2 (previously 7.2 x 5.2 cm). The more solid-appearing left-sided component is similar, measuring approximately 6.7 x 6.1 cm on image 106/2. Overall greatest dimension is approximately 8.6 cm on image 106/2 (previously 11.0 cm). Other: No ascites, free air or peritoneal nodularity. Musculoskeletal: No acute or significant osseous findings. IMPRESSION: 1. Potential mixed response to interval therapy. The mediastinal adenopathy and extensive pulmonary nodularity demonstrated previously have improved in the interval. 2. There are multiple newly visible low-density hepatic lesions which are suspicious for metastatic disease.  These may be visible due to interval treatment or reflect new lesions. 3. The complex solid and cystic adnexal mass has mildly enlarged from the prior study (primarily the cystic right-sided component). 4. Upper abdominal adenopathy has not significantly changed. 5. No acute findings identified. Electronically Signed   By: Richardean Sale M.D.   On: 01/03/2022 15:41   DG CHEST PORT 1 VIEW  Result Date: 12/10/2021 CLINICAL DATA:  Hypoxia. EXAM: PORTABLE CHEST 1 VIEW COMPARISON:  December 06, 2021. FINDINGS: Stable cardiomediastinal silhouette. Right-sided PICC line is unchanged in position. Stable nodular opacities are noted throughout both lungs consistent with metastatic disease. Small bilateral pleural effusions may be present. Bony thorax is unremarkable. IMPRESSION: Stable nodular opacities are noted throughout both lungs most consistent with metastatic disease. Small pleural effusions may be present. Electronically Signed   By: Marijo Conception M.D.   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    We discussed recommended treatment plan with whole brain radiation, though potentially fractioned into 14 days at 2.5 Gy instead of 30/10 per discussion with Dr. Isidore Moos.  Decadron should be decreased to 43m BID x5 days, then 464mdaily x5 days, then 21m41maily thereafter if tolerated.  No anti-epileptic warranted at this time.  We spent twenty additional minutes teaching regarding the natural history, biology, and historical experience in the treatment of neurologic complications of cancer.   We appreciate the opportunity to participate in the care of Melanie Ramirez can continue to follow up with us [place] needed throughout her course of CNS treatment and surveillance.  All questions were answered. The patient knows to call the clinic with any problems, questions or concerns. No barriers to learning were detected.  The total time spent in the encounter was 40 minutes and more than 50% was on counseling and review of test results   ZacVentura SellersD Medical Director of Neuro-Oncology [facility]/18/23 8:59 AM

## 2022-01-08 ENCOUNTER — Other Ambulatory Visit: Payer: Self-pay

## 2022-01-08 ENCOUNTER — Ambulatory Visit
Admission: RE | Admit: 2022-01-08 | Discharge: 2022-01-08 | Disposition: A | Discharge status: Home / Self Care | Payer: BC Managed Care – PPO | Source: Ambulatory Visit | Attending: Radiation Oncology | Admitting: Radiation Oncology

## 2022-01-08 ENCOUNTER — Other Ambulatory Visit: Payer: Self-pay | Admitting: Radiation Therapy

## 2022-01-08 ENCOUNTER — Encounter: Payer: Self-pay | Admitting: Radiation Oncology

## 2022-01-08 VITALS — BP 108/76 | HR 105 | Temp 97.6°F | Resp 18 | Ht 65.0 in | Wt 148.1 lb

## 2022-01-08 DIAGNOSIS — C7802 Secondary malignant neoplasm of left lung: Secondary | ICD-10-CM | POA: Diagnosis not present

## 2022-01-08 DIAGNOSIS — J90 Pleural effusion, not elsewhere classified: Secondary | ICD-10-CM | POA: Diagnosis not present

## 2022-01-08 DIAGNOSIS — C801 Malignant (primary) neoplasm, unspecified: Secondary | ICD-10-CM | POA: Diagnosis not present

## 2022-01-08 DIAGNOSIS — C787 Secondary malignant neoplasm of liver and intrahepatic bile duct: Secondary | ICD-10-CM | POA: Insufficient documentation

## 2022-01-08 DIAGNOSIS — Z5112 Encounter for antineoplastic immunotherapy: Secondary | ICD-10-CM | POA: Diagnosis not present

## 2022-01-08 DIAGNOSIS — Z79899 Other long term (current) drug therapy: Secondary | ICD-10-CM | POA: Insufficient documentation

## 2022-01-08 DIAGNOSIS — C7931 Secondary malignant neoplasm of brain: Secondary | ICD-10-CM | POA: Diagnosis present

## 2022-01-08 DIAGNOSIS — C7801 Secondary malignant neoplasm of right lung: Secondary | ICD-10-CM | POA: Diagnosis not present

## 2022-01-08 DIAGNOSIS — C7963 Secondary malignant neoplasm of bilateral ovaries: Secondary | ICD-10-CM | POA: Insufficient documentation

## 2022-01-08 LAB — PREGNANCY, URINE: Preg Test, Ur: NEGATIVE

## 2022-01-09 ENCOUNTER — Ambulatory Visit
Admission: RE | Admit: 2022-01-09 | Discharge: 2022-01-09 | Disposition: A | Discharge status: Home / Self Care | Payer: BC Managed Care – PPO | Source: Ambulatory Visit | Attending: Radiation Oncology | Admitting: Radiation Oncology

## 2022-01-09 ENCOUNTER — Other Ambulatory Visit: Payer: Self-pay

## 2022-01-09 DIAGNOSIS — Z5112 Encounter for antineoplastic immunotherapy: Secondary | ICD-10-CM | POA: Diagnosis not present

## 2022-01-09 LAB — RAD ONC ARIA SESSION SUMMARY
Course Elapsed Days: 0
Plan Fractions Treated to Date: 1
Plan Prescribed Dose Per Fraction: 2.5 Gy
Plan Total Fractions Prescribed: 14
Plan Total Prescribed Dose: 35 Gy
Reference Point Dosage Given to Date: 2.5 Gy
Reference Point Session Dosage Given: 2.5 Gy
Session Number: 1

## 2022-01-10 ENCOUNTER — Ambulatory Visit
Admission: RE | Admit: 2022-01-10 | Discharge: 2022-01-10 | Disposition: A | Discharge status: Home / Self Care | Payer: BC Managed Care – PPO | Source: Ambulatory Visit | Attending: Radiation Oncology | Admitting: Radiation Oncology

## 2022-01-10 ENCOUNTER — Inpatient Hospital Stay: Payer: BC Managed Care – PPO

## 2022-01-10 ENCOUNTER — Other Ambulatory Visit: Payer: Self-pay

## 2022-01-10 DIAGNOSIS — Z5112 Encounter for antineoplastic immunotherapy: Secondary | ICD-10-CM | POA: Diagnosis not present

## 2022-01-10 DIAGNOSIS — C7801 Secondary malignant neoplasm of right lung: Secondary | ICD-10-CM

## 2022-01-10 LAB — RAD ONC ARIA SESSION SUMMARY
Course Elapsed Days: 1
Plan Fractions Treated to Date: 2
Plan Prescribed Dose Per Fraction: 2.5 Gy
Plan Total Fractions Prescribed: 14
Plan Total Prescribed Dose: 35 Gy
Reference Point Dosage Given to Date: 5 Gy
Reference Point Session Dosage Given: 2.5 Gy
Session Number: 2

## 2022-01-10 MED ORDER — SODIUM CHLORIDE 0.9% FLUSH
10.0000 mL | Freq: Once | INTRAVENOUS | Status: AC
  Administered 2022-01-10: 10 mL

## 2022-01-10 MED ORDER — HEPARIN SOD (PORK) LOCK FLUSH 100 UNIT/ML IV SOLN
500.0000 [IU] | Freq: Once | INTRAVENOUS | Status: AC
  Administered 2022-01-10: 500 [IU]

## 2022-01-13 ENCOUNTER — Ambulatory Visit: Payer: BC Managed Care – PPO

## 2022-01-13 ENCOUNTER — Ambulatory Visit (HOSPITAL_COMMUNITY)
Admission: RE | Admit: 2022-01-13 | Discharge: 2022-01-13 | Disposition: A | Discharge status: Home / Self Care | Payer: BC Managed Care – PPO | Source: Ambulatory Visit | Attending: Hematology and Oncology | Admitting: Hematology and Oncology

## 2022-01-13 ENCOUNTER — Ambulatory Visit
Admission: RE | Admit: 2022-01-13 | Discharge: 2022-01-13 | Disposition: A | Discharge status: Home / Self Care | Payer: BC Managed Care – PPO | Source: Ambulatory Visit | Attending: Radiation Oncology | Admitting: Radiation Oncology

## 2022-01-13 ENCOUNTER — Other Ambulatory Visit: Payer: Self-pay

## 2022-01-13 ENCOUNTER — Telehealth: Payer: Self-pay

## 2022-01-13 ENCOUNTER — Encounter: Payer: Self-pay | Admitting: General Practice

## 2022-01-13 ENCOUNTER — Inpatient Hospital Stay: Payer: BC Managed Care – PPO

## 2022-01-13 ENCOUNTER — Other Ambulatory Visit: Payer: Self-pay | Admitting: Hematology and Oncology

## 2022-01-13 DIAGNOSIS — C7802 Secondary malignant neoplasm of left lung: Secondary | ICD-10-CM | POA: Insufficient documentation

## 2022-01-13 DIAGNOSIS — C7801 Secondary malignant neoplasm of right lung: Secondary | ICD-10-CM

## 2022-01-13 DIAGNOSIS — Z5112 Encounter for antineoplastic immunotherapy: Secondary | ICD-10-CM | POA: Diagnosis not present

## 2022-01-13 DIAGNOSIS — C7931 Secondary malignant neoplasm of brain: Secondary | ICD-10-CM

## 2022-01-13 LAB — RAD ONC ARIA SESSION SUMMARY
Course Elapsed Days: 4
Plan Fractions Treated to Date: 3
Plan Prescribed Dose Per Fraction: 2.5 Gy
Plan Total Fractions Prescribed: 14
Plan Total Prescribed Dose: 35 Gy
Reference Point Dosage Given to Date: 7.5 Gy
Reference Point Session Dosage Given: 2.5 Gy
Session Number: 3

## 2022-01-13 LAB — ABO/RH: ABO/RH(D): O POS

## 2022-01-13 LAB — SAMPLE TO BLOOD BANK

## 2022-01-13 MED ORDER — SONAFINE EX EMUL
1.0000 | Freq: Two times a day (BID) | CUTANEOUS | Status: DC
  Administered 2022-01-13: 1 via TOPICAL

## 2022-01-13 NOTE — Progress Notes (Signed)
Pt here for patient teaching.    Pt given Radiation and You booklet, skin care instructions, and Sonafine.    Reviewed areas of pertinence such as fatigue, hair loss in treatment field, nausea and vomiting, skin changes, headache, and blurry vision .   Pt able to give teach back of to pat skin, use unscented/gentle soap, and drink plenty of water,apply Sonafine bid and avoid applying anything to skin within 4 hours of treatment.   Pt demonstrated understanding and verbalizes understanding of information given and will contact nursing with any questions or concerns.    [email or web address]

## 2022-01-13 NOTE — Progress Notes (Signed)
[place] Spiritual Care Note  Followed up with Katie by phone for a quick pastoral check-in, which was helpful for her processing, meaning-making, and energy level. Being remembered and having an opportunity to name stressors and glimmers of joy/meaning/connection are very constructive for her right now, and we plan similar periodic "touchstones" in the future.  [name] is currently working on saying yes and saying no (setting expectations with others based on her generally low energy level). She enjoyed visits from her sisters this weekend and is looking forward to visits from close college friends for a movie-watching weekend for encouragement as she approaches the end of her radiation sequence.  Will follow up by phone, and [name] knows to reach out whenever needed/desired.   [place], [place], [facility] Pager [phone] Voicemail [phone]

## 2022-01-13 NOTE — Telephone Encounter (Signed)
She called and left a message. Returned her call. She is complaining that her lungs feel worse. O2 sats are okay.  More shortness of breath but it is worse at night. She sounds short of breath talking of the phone. Given above message to Dr. Alvy Bimler.  Scheduled lab appt at 3:30 pm today and then she needs to go to radiology for chest xray at [facility]. Scheduled appt to see Dr. Alvy Bimler at [facility] tomorrow. She verbralzied understanding to appts and appreciated the call.

## 2022-01-14 ENCOUNTER — Inpatient Hospital Stay (HOSPITAL_BASED_OUTPATIENT_CLINIC_OR_DEPARTMENT_OTHER): Payer: BC Managed Care – PPO | Admitting: Genetic Counselor

## 2022-01-14 ENCOUNTER — Ambulatory Visit
Admission: RE | Admit: 2022-01-14 | Discharge: 2022-01-14 | Disposition: A | Discharge status: Home / Self Care | Payer: BC Managed Care – PPO | Source: Ambulatory Visit | Attending: Radiation Oncology | Admitting: Radiation Oncology

## 2022-01-14 ENCOUNTER — Telehealth: Payer: Self-pay

## 2022-01-14 ENCOUNTER — Encounter: Payer: Self-pay | Admitting: Hematology and Oncology

## 2022-01-14 ENCOUNTER — Encounter: Payer: Self-pay | Admitting: Genetic Counselor

## 2022-01-14 ENCOUNTER — Other Ambulatory Visit: Payer: Self-pay

## 2022-01-14 ENCOUNTER — Ambulatory Visit (HOSPITAL_COMMUNITY)
Admission: RE | Admit: 2022-01-14 | Discharge: 2022-01-14 | Disposition: A | Discharge status: Home / Self Care | Payer: BC Managed Care – PPO | Source: Ambulatory Visit | Attending: Hematology and Oncology | Admitting: Hematology and Oncology

## 2022-01-14 ENCOUNTER — Inpatient Hospital Stay (HOSPITAL_BASED_OUTPATIENT_CLINIC_OR_DEPARTMENT_OTHER): Payer: BC Managed Care – PPO | Admitting: Hematology and Oncology

## 2022-01-14 ENCOUNTER — Inpatient Hospital Stay: Payer: BC Managed Care – PPO

## 2022-01-14 VITALS — BP 102/68 | HR 108 | Temp 98.9°F | Resp 18 | Ht 65.0 in | Wt 148.8 lb

## 2022-01-14 DIAGNOSIS — R071 Chest pain on breathing: Secondary | ICD-10-CM | POA: Insufficient documentation

## 2022-01-14 DIAGNOSIS — C801 Malignant (primary) neoplasm, unspecified: Secondary | ICD-10-CM | POA: Diagnosis not present

## 2022-01-14 DIAGNOSIS — C7801 Secondary malignant neoplasm of right lung: Secondary | ICD-10-CM | POA: Diagnosis not present

## 2022-01-14 DIAGNOSIS — Z803 Family history of malignant neoplasm of breast: Secondary | ICD-10-CM

## 2022-01-14 DIAGNOSIS — R0781 Pleurodynia: Secondary | ICD-10-CM | POA: Insufficient documentation

## 2022-01-14 DIAGNOSIS — C7800 Secondary malignant neoplasm of unspecified lung: Secondary | ICD-10-CM | POA: Diagnosis not present

## 2022-01-14 DIAGNOSIS — C7802 Secondary malignant neoplasm of left lung: Secondary | ICD-10-CM | POA: Diagnosis not present

## 2022-01-14 DIAGNOSIS — C771 Secondary and unspecified malignant neoplasm of intrathoracic lymph nodes: Secondary | ICD-10-CM | POA: Diagnosis not present

## 2022-01-14 DIAGNOSIS — Z800 Family history of malignant neoplasm of digestive organs: Secondary | ICD-10-CM

## 2022-01-14 DIAGNOSIS — Z7952 Long term (current) use of systemic steroids: Secondary | ICD-10-CM

## 2022-01-14 DIAGNOSIS — Z5112 Encounter for antineoplastic immunotherapy: Secondary | ICD-10-CM | POA: Diagnosis not present

## 2022-01-14 LAB — CBC WITH DIFFERENTIAL (CANCER CENTER ONLY)
Abs Immature Granulocytes: 3.65 10*3/uL — ABNORMAL HIGH (ref 0.00–0.07)
Basophils Absolute: 0.2 10*3/uL — ABNORMAL HIGH (ref 0.0–0.1)
Basophils Relative: 1 %
Eosinophils Absolute: 0 10*3/uL (ref 0.0–0.5)
Eosinophils Relative: 0 %
HCT: 34.8 % — ABNORMAL LOW (ref 36.0–46.0)
Hemoglobin: 12.2 g/dL (ref 12.0–15.0)
Immature Granulocytes: 19 %
Lymphocytes Relative: 7 %
Lymphs Abs: 1.3 10*3/uL (ref 0.7–4.0)
MCH: 30.8 pg (ref 26.0–34.0)
MCHC: 35.1 g/dL (ref 30.0–36.0)
MCV: 87.9 fL (ref 80.0–100.0)
Monocytes Absolute: 1.1 10*3/uL — ABNORMAL HIGH (ref 0.1–1.0)
Monocytes Relative: 6 %
Neutro Abs: 12.9 10*3/uL — ABNORMAL HIGH (ref 1.7–7.7)
Neutrophils Relative %: 67 %
Platelet Count: 202 10*3/uL (ref 150–400)
RBC: 3.96 MIL/uL (ref 3.87–5.11)
RDW: 14.5 % (ref 11.5–15.5)
Smear Review: NORMAL
WBC Count: 19.1 10*3/uL — ABNORMAL HIGH (ref 4.0–10.5)
nRBC: 0 % (ref 0.0–0.2)

## 2022-01-14 LAB — CMP (CANCER CENTER ONLY)
ALT: 23 U/L (ref 0–44)
AST: 46 U/L — ABNORMAL HIGH (ref 15–41)
Albumin: 3.9 g/dL (ref 3.5–5.0)
Alkaline Phosphatase: 170 U/L — ABNORMAL HIGH (ref 38–126)
Anion gap: 11 (ref 5–15)
BUN: 28 mg/dL — ABNORMAL HIGH (ref 6–20)
CO2: 20 mmol/L — ABNORMAL LOW (ref 22–32)
Calcium: 9.3 mg/dL (ref 8.9–10.3)
Chloride: 101 mmol/L (ref 98–111)
Creatinine: 0.73 mg/dL (ref 0.44–1.00)
GFR, Estimated: >60 mL/min (ref >60)
Glucose, Bld: 172 mg/dL — ABNORMAL HIGH (ref 70–99)
Potassium: 3.6 mmol/L (ref 3.5–5.1)
Sodium: 132 mmol/L — ABNORMAL LOW (ref 135–145)
Total Bilirubin: 0.3 mg/dL (ref 0.3–1.2)
Total Protein: 7 g/dL (ref 6.5–8.1)

## 2022-01-14 LAB — RAD ONC ARIA SESSION SUMMARY
Course Elapsed Days: 5
Plan Fractions Treated to Date: 4
Plan Prescribed Dose Per Fraction: 2.5 Gy
Plan Total Fractions Prescribed: 14
Plan Total Prescribed Dose: 35 Gy
Reference Point Dosage Given to Date: 10 Gy
Reference Point Session Dosage Given: 2.5 Gy
Session Number: 4

## 2022-01-14 MED ORDER — IOHEXOL 350 MG/ML SOLN
75.0000 mL | Freq: Once | INTRAVENOUS | Status: AC | PRN
  Administered 2022-01-14: 75 mL via INTRAVENOUS

## 2022-01-14 MED ORDER — HEPARIN SOD (PORK) LOCK FLUSH 100 UNIT/ML IV SOLN
INTRAVENOUS | Status: AC
  Filled 2022-01-14: qty 5

## 2022-01-14 MED ORDER — HEPARIN SOD (PORK) LOCK FLUSH 100 UNIT/ML IV SOLN
500.0000 [IU] | Freq: Once | INTRAVENOUS | Status: AC
  Administered 2022-01-14: 500 [IU] via INTRAVENOUS

## 2022-01-14 NOTE — Assessment & Plan Note (Signed)
I have reviewed her blood work and chest x-ray I cannot find convincing evidence of the cause of her chest pain and shortness of breath based on available test right now The last thing to consider would be to rule out pulmonary embolus Given her diagnosis of cancer and recent reduced mobility, it is possible that she might have new PE that could cause her symptoms I will order urgent CT angiogram evaluation today We will get urgent insurance prior authorization and will schedule CT imaging as soon as possible If the test is positive, the patient is aware she will need to go to the emergency department to be admitted for admission and IV heparin It is possible to treat symptomatic PE in the outpatient setting and she is aware If CT imaging came back negative for PE, I will review results with her on Friday on her scheduled visit appointment She expressed understanding

## 2022-01-14 NOTE — Telephone Encounter (Signed)
Called husband back and given below message with appt details for for CT this afternoon. He verbalized understanding.

## 2022-01-14 NOTE — Telephone Encounter (Signed)
Called and left a message for her to call the office back. CT scheduled at 4 pm today at [facility], arrive at 3:30 to main entrance. Liquids only for 4 hours prior to CT

## 2022-01-14 NOTE — Progress Notes (Signed)
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 [facility]) I have reviewed her blood work and chest x-ray I cannot find convincing evidence of the cause of her chest pain and shortness of breath based on available test right now The last thing to consider would be to rule out pulmonary embolus Given her diagnosis of cancer and recent reduced mobility, it is possible that she might have new PE that could cause her symptoms I will order urgent CT angiogram evaluation today We will get urgent insurance prior authorization and will schedule CT imaging as soon as possible If the test is positive, the patient is aware she will need to go to the emergency department to be admitted for admission and IV heparin It is possible to treat symptomatic PE in the outpatient setting and she is aware If CT imaging came back negative for PE, I will review results with her on Friday on her scheduled visit appointment She expressed understanding  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 [facility]) She is known to have lymphangitic spread of her tumor in her chest She is currently on steroid treatment and is scheduled to get next cycle of treatment next week She will continue steroid treatment  Orders Placed This Encounter  Procedures   CT Angio Chest Pulmonary Embolism (PE) W or WO Contrast    Standing Status:   Future    Standing Expiration Date:   01/15/2023    Order Specific Question:   If indicated for the ordered procedure, I authorize the administration of contrast media per Radiology protocol    Answer:   Yes    Order Specific Question:   Is patient pregnant?     Answer:   No    Order Specific Question:   Preferred imaging location?    Answer:   [facility]    All questions were answered. The patient knows to call the clinic with any problems, questions or concerns. The total time spent in the appointment was 40 minutes encounter with patients including review of chart and various tests results, discussions about plan of care and coordination of care plan   Heath Lark, MD 01/14/2022 9:57 AM  INTERVAL HISTORY: Please see below for problem oriented charting. 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 She is tolerating dexamethasone taper well She has no new weakness She felt difficult taking in a deep breath She also have persistent cough but denies hemoptysis  REVIEW OF SYSTEMS:   Constitutional: Denies fevers, chills or abnormal weight loss Eyes: Denies blurriness of vision Cardiovascular: Denies palpitation, chest discomfort or lower extremity swelling Gastrointestinal:  Denies nausea, heartburn or change in bowel habits Skin: Denies abnormal skin rashes Lymphatics: Denies new lymphadenopathy or easy bruising Neurological:Denies numbness, tingling or new weaknesses Behavioral/Psych: Mood is stable, no new changes  All other systems were reviewed with the patient and are negative.  I have reviewed the past medical history, past surgical history, social history and family history with the patient and they are unchanged from previous  note.  ALLERGIES:  is allergic to oxycodone.  MEDICATIONS:  Current Outpatient Medications  Medication Sig Dispense Refill   acetaminophen (TYLENOL) 500 MG tablet Take 1,000 mg by mouth every 6 (six) hours as needed for moderate pain or headache.     albuterol (VENTOLIN HFA) 108 (90 Base) MCG/ACT inhaler  Inhale 2 puffs into the lungs every 4 (four) hours as needed for wheezing or shortness of breath. 18 g 0   allopurinol (ZYLOPRIM) 300 MG tablet Take 1 tablet (300 mg total) by mouth daily. 30 tablet 1   buPROPion (WELLBUTRIN XL) 300 MG 24 hr tablet Take 300 mg by mouth daily.     citalopram (CELEXA) 40 MG tablet Take 40 mg by mouth daily.      dexamethasone (DECADRON) 4 MG tablet Take 1 tablet (4 mg total) by mouth 3 (three) times daily. (Patient taking differently: Take 4 mg by mouth 3 (three) times daily. Starting 01/07/22: 1 tablet BID for 5 days; then 1 tablet daily for 5 days; then 1/2 tablet until instructed otherwise) 90 tablet 1   dextromethorphan-guaiFENesin (MUCINEX DM) 30-600 MG 12hr tablet Take 1 tablet by mouth 2 (two) times daily. 30 tablet 0   ibuprofen (ADVIL) 200 MG tablet Take 800 mg by mouth every 6 (six) hours as needed for headache, mild pain or fever.     levalbuterol (XOPENEX) 0.63 MG/3ML nebulizer solution Take 3 mLs (0.63 mg total) by nebulization every 4 (four) hours as needed for wheezing or shortness of breath. 3 mL 12   LORazepam (ATIVAN) 0.5 MG tablet Take 1 tablet (0.5 mg total) by mouth 2 (two) times daily as needed for anxiety. 15 tablet 0   Magnesium 300 MG CAPS Take 300 mg by mouth every evening.     Multiple Vitamins-Minerals (MULTIVITAMIN WITH MINERALS) tablet Take 1 tablet by mouth daily.     ondansetron (ZOFRAN) 8 MG tablet Take 1 tablet (8 mg total) by mouth every 8 (eight) hours as needed for nausea. 30 tablet 3   prochlorperazine (COMPAZINE) 10 MG tablet Take 1 tablet (10 mg total) by mouth every 6 (six) hours as needed for nausea or vomiting. 30 tablet 0   Sodium Chloride Flush (NORMAL SALINE FLUSH) 0.9 % SOLN Please flush 10 ml normal saline daily 30 mL 3   Sodium Chloride Flush (NORMAL SALINE FLUSH) 0.9 % SOLN Inject 10 mLs into the vein daily. 300 mL 3   traZODone (DESYREL) 50 MG tablet Take 1 tablet (50 mg total) by mouth at bedtime. 30 tablet 0   No  current facility-administered medications for this visit.    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 Negative for acute PE or thoracic aortic dissection. 2. Encasement and narrowing of the right upper lobe pulmonary artery by tumor. 3. Stable bilateral pulmonary nodules, hilar and mediastinal adenopathy. 4. Progressive consolidation at the right lung base   12/04/2021 Imaging   1. A mixed solid and cystic mass replaces the left ovary measuring roughly 6.8 x 11.0 x 9.2 cm in greatest dimension. This may represent a primary ovarian mass or an ovarian metastasis. 2. Multiple pathologically enlarged lymph nodes within the gastrohepatic ligament, consistent with metastatic disease. 3. Innumerable pulmonary masses within the visualized lung bases bilaterally, better evaluated on previously performed dedicated CT imaging of the chest. 4. Small left pleural effusion. 5. Cholelithiasis without pericholecystic inflammatory change.     12/05/2021 Pathology Results  LUNG, RIGHT LOWER LOBE, NEEDLE CORE BIOPSY:  Non-small cell carcinoma, poorly differentiated with necrosis (see comment)   COMMENT:   Sections show multiple cores of pulmonary parenchyma multifocally infiltrated by sheets and nests of large, atypical cells with variably large round to oval nuclei and scattered small inconspicuous nucleoli.  There are foci of geographic tumor necrosis.  In areas focally the tumor shows features suggestive of a squamous differentiation. Sixteen immunohistochemical stains are performed with adequate control.  The  tumor is positive for pancytokeratin (AE1/AE3).  The tumor also shows patchy positivity for high molecular weight cytokeratin 5/6.  The tumor is negative for cytokeratin 7 and cytokeratin 20.  The tumor is negative for the squamous markers p40 and p63.  The tumor is negative for the pulmonary adeno markers Napsin A  and TTF-1.  The tumor is negative for the GYN and renal marker PAX8.  The tumor is negative for the GYN and mesothelial markers WT1 the tumor shows patchy positivity for the neuroendocrine markers synaptophysin and CD56 the tumor also shows variable positivity for p53.  The tumor shows variable strong positivity for p16.  The tumor is negative for estrogen receptor and progesterone receptor.   In summary the cores show a poorly differentiated non-small cell carcinoma with necrosis exhibiting focal neuroendocrine differentiation by immunohistochemistry.  This immunohistochemical pattern does not  suggest a primary for this tumor.  The cytokeratin 5/6 positivity, although commonly used for squamous differentiation within pulmonary tumors, may be seen in high-grade ovarian tumors particularly serous  carcinomas.  Clinically the patient is reported to have a large ovarian mass with multiple pulmonary nodules and extensive abdominal and mediastinal adenopathy.  Given this history, the p16 and p53 positivity  may be seen in ovarian tumors, but they may also be seen in tumors from other sites.  Also the tumor is negative for the more specific gynecologic markers cytokeratin 7, PAX8 and WT1.  Additional clinical  and radiologic correlation is recommended and an ovarian mass biopsy is suggested.  With additional tissue molecular testing would be possible if needed.    12/05/2021 Procedure   Successful CT-guided core needle biopsy of right lower lung pulmonary nodule/mass.    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 Potential mixed response to interval therapy. The mediastinal adenopathy and extensive pulmonary  nodularity demonstrated previously have improved in the interval. 2. There are multiple newly visible low-density hepatic lesions which are suspicious for metastatic disease. These may be visible due to interval treatment or reflect new lesions. 3. The complex solid and cystic adnexal mass has mildly enlarged from the prior study (primarily the cystic right-sided component). 4. Upper abdominal adenopathy has not significantly changed. 5. No acute findings identified.     01/03/2022 Imaging   Innumerable enhancing lesions scattered throughout the brain parenchyma consistent with metastatic disease to the brain   01/14/2022 Imaging   Stable findings of pulmonary metastatic disease and mediastinal lymphadenopathy. 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 [facility])   12/07/2021 - 12/07/2021 Chemotherapy   Patient is on Treatment  Plan : OVARIAN Carboplatin (AUC 6) / Paclitaxel (175) q21d x 6 cycles     12/30/2021 -  Chemotherapy   Patient is on Treatment Plan : LUNG SCLC Carboplatin + Etoposide + Atezolizumab Induction q21d / Atezolizumab Maintenance q21d       PHYSICAL EXAMINATION: ECOG PERFORMANCE STATUS: 2 - Symptomatic, <50% confined to bed  Vitals:   01/14/22 0826  BP: 102/68  Pulse: (!) 108  Resp: 18  Temp: 98.9 F (37.2 C)  SpO2: 93%   Filed Weights   01/14/22 0826  Weight: 148 lb 12.8 oz (67.5 kg)    GENERAL:alert, no distress and comfortable SKIN: skin color, texture, turgor are normal, no rashes or significant lesions EYES: normal, Conjunctiva are pink and non-injected, sclera clear OROPHARYNX:no exudate, no erythema and lips, buccal mucosa, and tongue normal  NECK: supple, thyroid normal size, non-tender, without nodularity LYMPH:  no palpable lymphadenopathy in the cervical, axillary or inguinal LUNGS: clear to auscultation and percussion with  normal breathing effort HEART: regular rate & rhythm and no murmurs and no lower extremity edema ABDOMEN:abdomen soft, non-tender and normal bowel sounds Musculoskeletal:no cyanosis of digits and no clubbing  NEURO: alert & oriented x 3 with fluent speech, no focal motor/sensory deficits  LABORATORY DATA:  I have reviewed the data as listed    Component Value Date/Time   NA 132 (L) 01/14/2022 0814   K 3.6 01/14/2022 0814   CL 101 01/14/2022 0814   CO2 20 (L) 01/14/2022 0814   GLUCOSE 172 (H) 01/14/2022 0814   BUN 28 (H) 01/14/2022 0814   CREATININE 0.73 01/14/2022 0814   CALCIUM 9.3 01/14/2022 0814   PROT 7.0 01/14/2022 0814   ALBUMIN 3.9 01/14/2022 0814   AST 46 (H) 01/14/2022 0814   ALT 23 01/14/2022 0814   ALKPHOS 170 (H) 01/14/2022 0814   BILITOT 0.3 01/14/2022 0814   GFRNONAA >60 01/14/2022 0814    No results found for: "SPEP", "UPEP"  Lab Results  Component Value Date   WBC 19.1 (H) 01/14/2022   NEUTROABS 12.9 (H) 01/14/2022   HGB 12.2 01/14/2022   HCT 34.8 (L) 01/14/2022   MCV 87.9 01/14/2022   PLT 202 01/14/2022      Chemistry      Component Value Date/Time   NA 132 (L) 01/14/2022 0814   K 3.6 01/14/2022 0814   CL 101 01/14/2022 0814   CO2 20 (L) 01/14/2022 0814   BUN 28 (H) 01/14/2022 0814   CREATININE 0.73 01/14/2022 0814      Component Value Date/Time   CALCIUM 9.3 01/14/2022 0814   ALKPHOS 170 (H) 01/14/2022 0814   AST 46 (H) 01/14/2022 0814   ALT 23 01/14/2022 0814   BILITOT 0.3 01/14/2022 0814       RADIOGRAPHIC STUDIES: I have personally reviewed the radiological images as listed and agreed with the findings in the report. DG Chest 2 View  Result Date: 01/14/2022 CLINICAL DATA:  metastatic cancer to lungs worsening SOB EXAM: CHEST - 2 VIEW COMPARISON:  Chest CT 01/02/2022, radiograph 12/10/2021 FINDINGS: Right upper extremity catheter tip overlies the distal superior vena cava. Unchanged cardiomediastinal silhouette with known  lymphadenopathy. There are unchanged nodular and reticular opacities bilaterally consistent with known metastatic disease. No pleural effusion. No pneumothorax. Bones are unchanged. IMPRESSION: Stable findings of pulmonary metastatic disease and mediastinal lymphadenopathy. No new acute findings. Electronically Signed   By: Maurine Simmering M.D.   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 EXAM: MRI HEAD WITHOUT AND WITH CONTRAST TECHNIQUE: Multiplanar, multiecho pulse sequences of the brain and surrounding structures were obtained without and with intravenous contrast. CONTRAST:  40m GADAVIST GADOBUTROL 1 MMOL/ML IV SOLN COMPARISON:  None Available. FINDINGS: Brain: Innumerable enhancing lesions are seen scattered throughout the brain parenchyma including supratentorial and infratentorial compartments, consistent with metastatic disease to the brain. Dominant lesions in the left cerebellar hemisphere (2.8 cm), left frontal lobe (1.5 cm) and left thalamus (1.2 cm) with surrounding vasogenic edema, particularly at the left cerebellar hemisphere with mass effect on the fourth ventricle without evidence of hydrocephalus or entrapment. No significant midline shift. No acute infarction, hemorrhage or extra-axial collection. Vascular: Normal flow voids. Skull and upper cervical spine: Normal marrow signal. Sinuses/Orbits: Negative. Other: None. IMPRESSION: Innumerable enhancing lesions scattered throughout the brain parenchyma consistent with metastatic disease to the brain. Electronically Signed   By:  KPedro EarlsM.D.   On: 01/03/2022 17:13   CT CHEST ABDOMEN PELVIS W CONTRAST  Result Date: 01/03/2022 CLINICAL DATA:  Small cell lung cancer. Assess treatment response. Patient reports weakness, dizziness and shortness of breath. * Tracking Code: BO * EXAM: CT CHEST, ABDOMEN, AND PELVIS WITH CONTRAST TECHNIQUE: Multidetector CT imaging of the chest, abdomen and pelvis was performed following the standard protocol during bolus administration of intravenous contrast. RADIATION DOSE REDUCTION: This exam was performed according to the departmental dose-optimization program which includes automated exposure control, adjustment of the mA and/or kV according to patient size and/or use of iterative reconstruction technique. CONTRAST:  1045mOMNIPAQUE IOHEXOL 300 MG/ML  SOLN COMPARISON:  Prior CTs 12/04/2021. FINDINGS: CT CHEST FINDINGS Cardiovascular: No acute vascular findings are seen, although there is suboptimal opacification of pulmonary arteries. A right arm PICC extends to the superior cavoatrial junction. The heart size is normal. There is no pericardial effusion. Mediastinum/Nodes: Previously demonstrated bulky mediastinal and hilar adenopathy appears mildly improved from the previous study. A right paratracheal nodal mass measures 4.1 x 2.6 cm on image 20/2 (previously 4.7 x 3.2 cm). A subcarinal node has a short axis dimension of 2.0 cm on image 31/2 (previously 3.4 cm). No axillary adenopathy. The thyroid gland, trachea and esophagus demonstrate no significant findings. Lungs/Pleura: Trace right pleural effusion. No pneumothorax. Extensive pulmonary nodularity bilaterally has moderately improved in the interval. A representative nodule in the right lower lobe currently measures 1.3 cm on image 73/5 (previously 2.7 cm). There are less confluent nodules and adjacent airspace disease in both lower lobes and the right middle lobe. No enlarging lung masses identified. Musculoskeletal/Chest wall:  No chest wall mass or suspicious osseous findings. CT ABDOMEN AND PELVIS FINDINGS Hepatobiliary: There are numerous new low-density lesions throughout the liver. Representative lesions include a 1.5 x 1.2 cm lesion in the right lobe (segment 6, image 62/2), a 1.1 cm lesion centrally in the right lobe (segment 8, image 54/2) and a 1.6 cm lesion in the left lobe (segment 3, image 60/2). A larger area of ill-defined low-density peripherally in the left lobe measures 3.9 x 1.8 cm on image 57/2. No evidence of gallstones, gallbladder wall thickening or biliary dilatation. Pancreas: Unremarkable. No pancreatic ductal dilatation or surrounding inflammatory changes.  Spleen: Normal in size without focal abnormality. Adrenals/Urinary Tract: Both adrenal glands appear normal. The kidneys appear normal without evidence of urinary tract calculus, suspicious lesion or hydronephrosis. The bladder appears normal for its degree of distention. Stomach/Bowel: Some enteric contrast was administered and has passed into the distal small bowel. The stomach appears unremarkable for its degree of distension. No evidence of bowel wall thickening, distention or surrounding inflammatory change. Prominent stool throughout the colon consistent with constipation. Vascular/Lymphatic: Gastrohepatic ligament adenopathy appears similar to the previous study, measuring up to 1.4 cm short axis on image 57/2. No progressive abdominopelvic adenopathy. No acute vascular findings. Reproductive: Again demonstrated is a large complex solid and cystic pelvic mass. This previously appeared separate from the right ovary. Currently, the right-sided low-density component abuts the right ovary and has mildly enlarged from the prior study, measuring 8.2 x 5.6 cm on image 107/2 (previously 7.2 x 5.2 cm). The more solid-appearing left-sided component is similar, measuring approximately 6.7 x 6.1 cm on image 106/2. Overall greatest dimension is approximately 8.6 cm on  image 106/2 (previously 11.0 cm). Other: No ascites, free air or peritoneal nodularity. Musculoskeletal: No acute or significant osseous findings. IMPRESSION: 1. Potential mixed response to interval therapy. The mediastinal adenopathy and extensive pulmonary nodularity demonstrated previously have improved in the interval. 2. There are multiple newly visible low-density hepatic lesions which are suspicious for metastatic disease. These may be visible due to interval treatment or reflect new lesions. 3. The complex solid and cystic adnexal mass has mildly enlarged from the prior study (primarily the cystic right-sided component). 4. Upper abdominal adenopathy has not significantly changed. 5. No acute findings identified. Electronically Signed   By: Richardean Sale M.D.   On: 01/03/2022 15:41

## 2022-01-14 NOTE — Assessment & Plan Note (Signed)
She is known to have lymphangitic spread of her tumor in her chest She is currently on steroid treatment and is scheduled to get next cycle of treatment next week She will continue steroid treatment

## 2022-01-14 NOTE — Telephone Encounter (Signed)
Called and left a message for husband to call the office back.

## 2022-01-14 NOTE — Progress Notes (Signed)
REFERRING PROVIDER: Heath Lark, MD [place],   [id number]  PRIMARY PROVIDER:  Sabra Heck, [place], [place]  PRIMARY REASON FOR VISIT:  1. Family history of colon cancer   2. Family history of breast cancer   3. 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 female, was seen for a [facility] cancer genetics consultation at the request of Dr. Alvy Bimler due to a personal and family history of cancer.  Melanie Ramirez presents to clinic today to discuss the possibility of a hereditary predisposition to cancer, genetic testing, and to further clarify her future cancer risks, as well as potential cancer risks for family members.   In June 2023, at the age of 68, Melanie Ramirez was diagnosed with metastatic cancer to the lungs, thought to be a GYN primary cancer. The treatment plan is chemotherapy and radiation.     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 Negative for acute PE or thoracic aortic dissection. 2. Encasement and narrowing of the right upper lobe pulmonary artery by tumor. 3. Stable bilateral pulmonary nodules, hilar and mediastinal adenopathy. 4. Progressive consolidation at the right lung base   12/04/2021 Imaging   1. A mixed solid and cystic mass replaces the left ovary measuring roughly 6.8 x 11.0 x 9.2 cm in greatest dimension. This may represent a primary ovarian mass or an ovarian metastasis. 2. Multiple pathologically enlarged lymph nodes within the gastrohepatic ligament, consistent with metastatic disease. 3. Innumerable pulmonary masses within the visualized lung bases bilaterally, better evaluated on previously performed dedicated CT imaging of the chest. 4. Small left pleural effusion. 5. Cholelithiasis without pericholecystic inflammatory change.     12/05/2021 Pathology Results    LUNG, RIGHT LOWER LOBE, NEEDLE CORE BIOPSY:  Non-small cell carcinoma, poorly differentiated with necrosis (see comment)   COMMENT:   Sections show multiple cores of pulmonary parenchyma multifocally infiltrated by sheets and nests of large, atypical cells with variably large round to oval nuclei and scattered small inconspicuous nucleoli.  There are foci of geographic tumor necrosis.  In areas focally the tumor shows features suggestive of a squamous differentiation. Sixteen immunohistochemical stains are performed with adequate control.  The  tumor is positive for pancytokeratin (AE1/AE3).  The tumor also shows patchy positivity for high molecular weight cytokeratin 5/6.  The tumor is negative for cytokeratin 7 and cytokeratin 20.  The tumor is negative for the squamous markers p40 and p63.  The tumor is negative for the pulmonary adeno markers Napsin A and TTF-1.  The tumor is negative for the GYN and renal marker PAX8.  The tumor is negative for the GYN and mesothelial markers WT1 the tumor shows patchy positivity for the neuroendocrine markers synaptophysin and CD56 the tumor also shows variable positivity for p53.  The tumor shows variable strong positivity for p16.  The tumor is negative for estrogen receptor and progesterone receptor.   In summary the cores show a poorly differentiated non-small cell carcinoma with necrosis exhibiting focal neuroendocrine differentiation by immunohistochemistry.  This immunohistochemical pattern does not  suggest a primary for this tumor.  The cytokeratin 5/6 positivity, although commonly used for squamous differentiation within pulmonary tumors, may be seen in high-grade ovarian tumors particularly serous  carcinomas.  Clinically the patient is reported to have a large ovarian mass with multiple pulmonary nodules  and extensive abdominal and mediastinal adenopathy.  Given this history, the p16 and p53 positivity  may be seen in ovarian tumors, but they may also  be seen in tumors from other sites.  Also the tumor is negative for the more specific gynecologic markers cytokeratin 7, PAX8 and WT1.  Additional clinical  and radiologic correlation is recommended and an ovarian mass biopsy is suggested.  With additional tissue molecular testing would be possible if needed.    12/05/2021 Procedure   Successful CT-guided core needle biopsy of right lower lung pulmonary nodule/mass.    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 Potential mixed response to interval therapy. The mediastinal adenopathy and extensive pulmonary nodularity demonstrated previously have improved in the interval. 2. There are multiple newly visible low-density hepatic lesions which are suspicious for metastatic disease. These may be visible due to interval treatment or reflect new lesions. 3. The complex solid and cystic adnexal mass has mildly enlarged from the prior study (primarily the cystic right-sided component). 4. Upper abdominal adenopathy has not significantly changed. 5. No acute findings identified.     01/03/2022 Imaging   Innumerable enhancing lesions scattered throughout the brain parenchyma consistent with metastatic disease to the brain   01/14/2022 Imaging   Stable findings of pulmonary metastatic disease and mediastinal lymphadenopathy. 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  First live birth at age 86.  OCP use for approximately 10 years.  Ovaries intact: yes.  Hysterectomy: no.  Menopausal status: premenopausal.  HRT use: 0 years. Colonoscopy: no; not examined. Mammogram within the last year: no. Number of breast biopsies: 0. Up to date with pelvic exams: yes. Any excessive radiation exposure in the past: no  Past Medical History:  Diagnosis Date   Active labor 03/21/2011   Anxiety    Depression    Family history of breast cancer    Family history of colon cancer    Headache(784.0)    occ. migraines with periods   Pneumonia    as teenager   Postpartum care following vaginal delivery (9/28) 03/21/2011    Past Surgical History:  Procedure Laterality Date   DENTAL SURGERY     DILATION AND EVACUATION N/A 07/29/2013   Procedure: Ultrasound guided Suction DILATATION AND EVACUATION (D&E) 2ND TRIMESTER with tissue sent for chromosome analysis;  Surgeon: Lovenia Kim, MD;  Location: [facility];  Service: Gynecology;  Laterality: N/A;   FOOT SURGERY Left 2018   calcaneus   VENTRAL HERNIA REPAIR N/A 12/08/2018   Procedure: LAPAROSCOPIC VENTRAL WALL HERNIA REPAIR WITH MESH;  Surgeon: Michael Boston, MD;  Location: [facility];  Service: General;  Laterality: N/A;  Social History   Socioeconomic History   Marital status: Married    Spouse name: Not on file   Number of children: Not on file   Years of education: Not on file   Highest education level: Not on file  Occupational History   Not on file  Tobacco Use   Smoking status: Never   Smokeless tobacco: Never  Vaping Use   Vaping Use: Never used  Substance and Sexual Activity   Alcohol use: Yes    Alcohol/week: 1.0 standard drink of alcohol    Types: 1 Glasses of wine per week    Comment: weekly   Drug use: No    Sexual activity: Yes  Other Topics Concern   Not on file  Social History Narrative   Not on file   Social Determinants of Health   Financial Resource Strain: Not on file  Food Insecurity: Not on file  Transportation Needs: Not on file  Physical Activity: Not on file  Stress: Not on file  Social Connections: Not on file     FAMILY HISTORY:  We obtained a detailed, 4-generation family history.  Significant diagnoses are listed below: Family History  Problem Relation Age of Onset   Depression Mother    Skin cancer Mother    Skin cancer Father    Depression Father    Depression Sister    Depression Brother    Heart failure Maternal Grandfather    Breast cancer Paternal Grandmother        dx > 40   Colon cancer Paternal Grandmother        dx > 1   Heart disease Paternal Grandfather    Depression Daughter    Breast cancer Other        MGM's sister dx > 62     The patient has two daughters who are cancer free.  Her oldest daughter had a fibrocystic tumor removed from her breast at age 36.  She has three sisters who are cancer free. Both parents are living.  The patient's mother is 12.  She has a brother and sister who are cancer free.  The maternal grandparents are deceased from no-cancer related issues. The grandmother's sister had breast cancer over 22's.  The patient's father has three sisters who are cancer free.  His mother had both breast and colon cancer over age 47.  His father died of heart disease.  Melanie Ramirez is unaware of previous family history of genetic testing for hereditary cancer risks. Patient's maternal ancestors are of [place], [place] and [place] descent, and paternal ancestors are of [place] and [place] descent. There is no reported Ashkenazi Jewish ancestry. There is no known consanguinity.  GENETIC COUNSELING ASSESSMENT: Melanie Ramirez is a 41 y.o. female with a personal and family history of cancer which is somewhat suggestive of a hereditary cancer syndrome  and predisposition to cancer given the combination of cancer and young ages of onset. We, therefore, discussed and recommended the following at today's visit.   DISCUSSION: We discussed that, in general, most cancer is not inherited in families, but instead is sporadic or familial. Sporadic cancers occur by chance and typically happen at older ages (>50 years) as this type of cancer is caused by genetic changes acquired during an individual's lifetime. Some families have more cancers than would be expected by chance; however, the ages or types of cancer are not consistent with a known genetic mutation or known genetic mutations have been ruled out. This type of familial  cancer is thought to be due to a combination of multiple genetic, environmental, hormonal, and lifestyle factors. While this combination of factors likely increases the risk of cancer, the exact source of this risk is not currently identifiable or testable.  We discussed that 5 - 10% of cancer is hereditary.  Thinking about GYN cancers, and ovarian cancer specifically, most cases that are hereditary are associated with BRCA mutations.  There are other genes that can be associated with hereditary GYN cancer syndromes.  These include PALB2, BRIP1, RAD51C/D and the Lynch syndrome genes.  We discussed that testing is beneficial for several reasons including knowing how to follow individuals after completing their treatment, identifying whether potential treatment options such as PARP inhibitors would be beneficial, and understand if other family members could be at risk for cancer and allow them to undergo genetic testing.   We reviewed the characteristics, features and inheritance patterns of hereditary cancer syndromes. We also discussed genetic testing, including the appropriate family members to test, the process of testing, insurance coverage and turn-around-time for results. We discussed the implications of a negative, positive and/or variant  of uncertain significant result. In order to get genetic test results in a timely manner so that Melanie Ramirez can use these genetic test results for surgical decisions, we recommended Melanie Ramirez pursue genetic testing for the [facility].  We discussed that since her testing was ordered last week, we will attempt to change the order to STAT BRCAPlus. IF we are able, then results could be back in 7-10 days. Once complete, we recommend Melanie Ramirez pursue reflex genetic testing to the CancerNext-Expanded+RNAinsight gene panel.   The CancerNext-Expanded gene panel offered by [facility] and includes sequencing and rearrangement analysis for the following 77 genes: AIP, ALK, APC*, ATM*, AXIN2, BAP1, BARD1, BLM, BMPR1A, BRCA1*, BRCA2*, BRIP1*, CDC73, CDH1*, CDK4, CDKN1B, CDKN2A, CHEK2*, CTNNA1, DICER1, FANCC, FH, FLCN, GALNT12, KIF1B, LZTR1, MAX, MEN1, MET, MLH1*, MSH2*, MSH3, MSH6*, MUTYH*, NBN, NF1*, NF2, NTHL1, PALB2*, PHOX2B, PMS2*, POT1, PRKAR1A, PTCH1, PTEN*, RAD51C*, RAD51D*, RB1, RECQL, RET, SDHA, SDHAF2, SDHB, SDHC, SDHD, SMAD4, SMARCA4, SMARCB1, SMARCE1, STK11, SUFU, TMEM127, TP53*, TSC1, TSC2, VHL and XRCC2 (sequencing and deletion/duplication); EGFR, EGLN1, HOXB13, KIT, MITF, PDGFRA, POLD1, and POLE (sequencing only); EPCAM and GREM1 (deletion/duplication only). DNA and RNA analyses performed for * genes.   Based on Ms. Schlack personal and family history of cancer, she meets medical criteria for genetic testing. Despite that she meets criteria, she may still have an out of pocket cost.   PLAN: After considering the risks, benefits, and limitations, Melanie Ramirez provided informed consent to pursue genetic testing and the blood sample was sent to [facility] for analysis of the BRCAPlus and CancerNext-Expanded+RNAinsight. Results should be available within approximately 2-3 weeks' time, at which point they will be disclosed by telephone to Melanie Ramirez, as will any additional recommendations warranted  by these results. Melanie Ramirez will receive a summary of her genetic counseling visit and a copy of her results once available. This information will also be available in Epic.   Lastly, we encouraged Melanie Ramirez to remain in contact with cancer genetics annually so that we can continuously update the family history and inform her of any changes in cancer genetics and testing that may be of benefit for this family.   Melanie Ramirez questions were answered to her satisfaction today. Our contact information was provided should additional questions or concerns arise. Thank you for the referral and allowing [place] to share in the care  of your patient.   Latash Nouri P. Florene Glen, [place], [facility] Licensed, Insurance risk surveyor [email or web address] phone: [phone]  The patient was seen for a total of 35 minutes in face-to-face genetic counseling.  The patient brought her husband. This patient was discussed with Drs. Magrinat, Lindi Adie and/or Burr Medico who agrees with the above.    _______________________________________________________________________ For Office Staff:  Number of people involved in session: 2 Was an Intern/ student involved with case: no

## 2022-01-15 ENCOUNTER — Other Ambulatory Visit: Payer: Self-pay

## 2022-01-15 ENCOUNTER — Telehealth: Payer: Self-pay | Admitting: Hematology and Oncology

## 2022-01-15 ENCOUNTER — Ambulatory Visit
Admission: RE | Admit: 2022-01-15 | Discharge: 2022-01-15 | Disposition: A | Discharge status: Home / Self Care | Payer: BC Managed Care – PPO | Source: Ambulatory Visit | Attending: Radiation Oncology | Admitting: Radiation Oncology

## 2022-01-15 ENCOUNTER — Telehealth: Payer: Self-pay

## 2022-01-15 ENCOUNTER — Encounter: Payer: Self-pay | Admitting: Radiation Oncology

## 2022-01-15 ENCOUNTER — Ambulatory Visit: Payer: BC Managed Care – PPO | Admitting: Radiation Oncology

## 2022-01-15 DIAGNOSIS — C7802 Secondary malignant neoplasm of left lung: Secondary | ICD-10-CM

## 2022-01-15 DIAGNOSIS — Z5112 Encounter for antineoplastic immunotherapy: Secondary | ICD-10-CM | POA: Diagnosis not present

## 2022-01-15 LAB — RAD ONC ARIA SESSION SUMMARY
Course Elapsed Days: 6
Plan Fractions Treated to Date: 5
Plan Prescribed Dose Per Fraction: 2.5 Gy
Plan Total Fractions Prescribed: 14
Plan Total Prescribed Dose: 35 Gy
Reference Point Dosage Given to Date: 12.5 Gy
Reference Point Session Dosage Given: 2.5 Gy
Session Number: 5

## 2022-01-15 NOTE — Progress Notes (Addendum)
SATURATION QUALIFICATIONS: (This note is used to comply with regulatory documentation for home oxygen)   Patient Saturations on Room Air at Rest = 92% (HR: 85)   Patient Saturations on Room Air while Ambulating = 87 (HR: 125 and patient began coughing uncontrollably with increased effort of breathing)   Pt saturations ambulating with oxygen @ 2 liters = 96% (HR: 112; patient's coughing eased and pulse began to lower once patient was seated)   Please briefly explain why patient needs home oxygen: Patient requires [place] of oxygen while ambulating to maintain saturations at an acceptable level, and decrease work-effort of breathing with activity

## 2022-01-15 NOTE — Telephone Encounter (Signed)
I have reviewed CT findings with patient I recommend addition of XRT to her chest, will ask if radiation oncologist can assess today I recommend the patient to remain on dexamethasone 4 mg BID

## 2022-01-15 NOTE — Progress Notes (Signed)
I received a message this morning from Dr. Alvy Bimler informing me that the patient underwent CT angiogram for shortness of breath to rule out PE.  She was not found to have a pulmonary embolus but she did have progressive disease in the chest, including bulky adenopathy in the mediastinum that was pressing on her superior vena cava.  We brought the patient in early today to discuss this and consent her for palliative radiation to her chest.  The patient and her mother are enthusiastic about proceeding.  I recommend that she receive 30 Gray in 10 fractions to the bulky adenopathy in her mediastinum and right hilum.  We we will conduct treatment planning today and start the treatment tomorrow.  She understands the most common side effects from this treatment include but may not necessarily be limited to fatigue, skin irritation, and esophagitis.  There is also risk of rare serious injury to organs of the torso.  Consent has been signed and placed in her chart. We will continue whole brain radiation concurrently.  She also reports worsening cough in addition to her shortness of breath.  Symptoms are helped by supplemental oxygen.  Nursing is going to try to help her get set up with home oxygen.  -----------------------------------  Melanie Gibson, MD

## 2022-01-15 NOTE — Telephone Encounter (Signed)
CSW phoned patient to assess needs.  Spoke with her husband, Melanie Ramirez, briefly who was waiting for O2 to be delivered.  He stated he would contact [facility] directly.

## 2022-01-16 ENCOUNTER — Ambulatory Visit
Admission: RE | Admit: 2022-01-16 | Discharge: 2022-01-16 | Disposition: A | Discharge status: Home / Self Care | Payer: BC Managed Care – PPO | Source: Ambulatory Visit | Attending: Radiation Oncology | Admitting: Radiation Oncology

## 2022-01-16 ENCOUNTER — Other Ambulatory Visit: Payer: Self-pay

## 2022-01-16 DIAGNOSIS — Z5112 Encounter for antineoplastic immunotherapy: Secondary | ICD-10-CM | POA: Diagnosis not present

## 2022-01-16 LAB — RAD ONC ARIA SESSION SUMMARY
Course Elapsed Days: 7
Plan Fractions Treated to Date: 1
Plan Fractions Treated to Date: 6
Plan Prescribed Dose Per Fraction: 2.5 Gy
Plan Prescribed Dose Per Fraction: 3 Gy
Plan Total Fractions Prescribed: 10
Plan Total Fractions Prescribed: 14
Plan Total Prescribed Dose: 30 Gy
Plan Total Prescribed Dose: 35 Gy
Reference Point Dosage Given to Date: 15 Gy
Reference Point Dosage Given to Date: 3 Gy
Reference Point Session Dosage Given: 2.5 Gy
Reference Point Session Dosage Given: 3 Gy
Session Number: 6

## 2022-01-17 ENCOUNTER — Inpatient Hospital Stay (HOSPITAL_BASED_OUTPATIENT_CLINIC_OR_DEPARTMENT_OTHER): Payer: BC Managed Care – PPO | Admitting: Hematology and Oncology

## 2022-01-17 ENCOUNTER — Other Ambulatory Visit: Payer: Self-pay

## 2022-01-17 ENCOUNTER — Inpatient Hospital Stay: Payer: BC Managed Care – PPO

## 2022-01-17 ENCOUNTER — Ambulatory Visit
Admission: RE | Admit: 2022-01-17 | Discharge: 2022-01-17 | Disposition: A | Discharge status: Home / Self Care | Payer: BC Managed Care – PPO | Source: Ambulatory Visit | Attending: Radiation Oncology | Admitting: Radiation Oncology

## 2022-01-17 ENCOUNTER — Telehealth: Payer: Self-pay

## 2022-01-17 ENCOUNTER — Other Ambulatory Visit (HOSPITAL_COMMUNITY): Payer: Self-pay

## 2022-01-17 ENCOUNTER — Inpatient Hospital Stay: Payer: BC Managed Care – PPO | Admitting: Hematology and Oncology

## 2022-01-17 DIAGNOSIS — Z5112 Encounter for antineoplastic immunotherapy: Secondary | ICD-10-CM | POA: Diagnosis not present

## 2022-01-17 DIAGNOSIS — R112 Nausea with vomiting, unspecified: Secondary | ICD-10-CM

## 2022-01-17 DIAGNOSIS — C801 Malignant (primary) neoplasm, unspecified: Secondary | ICD-10-CM

## 2022-01-17 DIAGNOSIS — C7800 Secondary malignant neoplasm of unspecified lung: Secondary | ICD-10-CM

## 2022-01-17 DIAGNOSIS — C7802 Secondary malignant neoplasm of left lung: Secondary | ICD-10-CM

## 2022-01-17 DIAGNOSIS — C7801 Secondary malignant neoplasm of right lung: Secondary | ICD-10-CM

## 2022-01-17 LAB — RAD ONC ARIA SESSION SUMMARY
Course Elapsed Days: 8
Plan Fractions Treated to Date: 2
Plan Fractions Treated to Date: 7
Plan Prescribed Dose Per Fraction: 2.5 Gy
Plan Prescribed Dose Per Fraction: 3 Gy
Plan Total Fractions Prescribed: 10
Plan Total Fractions Prescribed: 14
Plan Total Prescribed Dose: 30 Gy
Plan Total Prescribed Dose: 35 Gy
Reference Point Dosage Given to Date: 17.5 Gy
Reference Point Dosage Given to Date: 6 Gy
Reference Point Session Dosage Given: 2.5 Gy
Reference Point Session Dosage Given: 3 Gy
Session Number: 7

## 2022-01-17 MED ORDER — SODIUM CHLORIDE 0.9% FLUSH
10.0000 mL | Freq: Once | INTRAVENOUS | Status: AC
  Administered 2022-01-17: 10 mL

## 2022-01-17 MED ORDER — MORPHINE SULFATE 15 MG PO TABS
15.0000 mg | ORAL_TABLET | ORAL | 0 refills | Status: AC | PRN
  Filled 2022-01-17: qty 60, 10d supply, fill #0

## 2022-01-17 MED ORDER — HEPARIN SOD (PORK) LOCK FLUSH 100 UNIT/ML IV SOLN
250.0000 [IU] | Freq: Once | INTRAVENOUS | Status: AC
  Administered 2022-01-17: 250 [IU]

## 2022-01-17 MED ORDER — NORMAL SALINE FLUSH 0.9 % IV SOLN
INTRAVENOUS | 3 refills | Status: AC
  Filled 2022-01-17: qty 300, 30d supply, fill #0

## 2022-01-17 MED ORDER — LORAZEPAM 1 MG PO TABS
1.0000 mg | ORAL_TABLET | Freq: Four times a day (QID) | ORAL | 0 refills | Status: AC | PRN
  Filled 2022-01-17: qty 60, 15d supply, fill #0

## 2022-01-17 MED FILL — Dexamethasone Sodium Phosphate Inj 100 MG/10ML: INTRAMUSCULAR | Qty: 1 | Status: AC

## 2022-01-17 MED FILL — Fosaprepitant Dimeglumine For IV Infusion 150 MG (Base Eq): INTRAVENOUS | Qty: 5 | Status: AC

## 2022-01-17 NOTE — Telephone Encounter (Signed)
Called and left husband a message to call the office back regarding missed appts today.

## 2022-01-17 NOTE — Telephone Encounter (Signed)
Called and left a message asking her to call the office back regarding today's appts.

## 2022-01-19 ENCOUNTER — Encounter: Payer: Self-pay | Admitting: Hematology and Oncology

## 2022-01-19 NOTE — Assessment & Plan Note (Signed)
She will continue on oxygen therapy I recommend low dose morphine and lorazepam to help with her breathing

## 2022-01-19 NOTE — Progress Notes (Signed)
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 [facility]) I have reviewed multiple imaging studies with her and her husband She will continue radiation therapy as scheduled She will proceed with chemo as scheduled  Nausea and vomiting This has improved with twice daily dexamethasone She will continue the same until after completion of radiation treatment  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   All questions were answered. The patient knows to call the clinic with any problems, questions or concerns. The total time spent in the appointment was 30 minutes encounter with patients including review of chart and various tests results, discussions about plan of care and coordination of care plan   Heath Lark, MD 01/19/2022 9:41 AM  INTERVAL HISTORY: Please see below for problem oriented charting. she returns for treatment follow-up with her husband She has been using oxygen recently due to orthopnea and dyspnea on exertion Nausea and headaches are improved  REVIEW OF SYSTEMS:   Constitutional: Denies fevers, chills or abnormal weight loss Eyes: Denies blurriness of vision Ears, nose, mouth, throat, and face: Denies mucositis or sore throat Cardiovascular: Denies palpitation, chest discomfort or lower extremity swelling Gastrointestinal:  Denies nausea, heartburn or change in bowel habits Skin: Denies abnormal skin rashes Lymphatics: Denies new lymphadenopathy or easy  bruising Behavioral/Psych: Mood is stable, no new changes  All other systems were reviewed with the patient and are negative.  I have reviewed the past medical history, past surgical history, social history and family history with the patient and they are unchanged from previous note.  ALLERGIES:  is allergic to oxycodone.  MEDICATIONS:  Current Outpatient Medications  Medication Sig Dispense Refill   morphine (MSIR) 15 MG tablet Take 1 tablet (15 mg total) by mouth every 4 (four) hours as needed for severe pain. 60 tablet 0   acetaminophen (TYLENOL) 500 MG tablet Take 1,000 mg by mouth every 6 (six) hours as needed for moderate pain or headache.     albuterol (VENTOLIN HFA) 108 (90 Base) MCG/ACT inhaler Inhale 2 puffs into the lungs every 4 (four) hours as needed for wheezing or shortness of breath. 18 g 0   allopurinol (ZYLOPRIM) 300 MG tablet Take 1 tablet (300 mg total) by mouth daily. 30 tablet 1   buPROPion (WELLBUTRIN XL) 300 MG 24 hr tablet Take 300 mg by mouth daily.     citalopram (CELEXA) 40 MG tablet Take 40 mg by mouth daily.      dexamethasone (DECADRON) 4 MG tablet Take 1 tablet (4 mg total) by mouth 3 (three) times daily. (Patient taking differently: Take 4 mg by mouth 3 (three) times daily. Starting 01/07/22: 1 tablet BID for 5 days; then 1 tablet daily for 5 days; then 1/2 tablet until instructed otherwise) 90 tablet 1   dextromethorphan-guaiFENesin (MUCINEX DM) 30-600 MG 12hr tablet Take 1 tablet by mouth 2 (two) times daily. 30 tablet 0   ibuprofen (ADVIL) 200 MG tablet Take 800 mg by mouth every 6 (six) hours as needed for headache, mild pain or  fever.     levalbuterol (XOPENEX) 0.63 MG/3ML nebulizer solution Take 3 mLs (0.63 mg total) by nebulization every 4 (four) hours as needed for wheezing or shortness of breath. 3 mL 12   LORazepam (ATIVAN) 1 MG tablet Take 1 tablet (1 mg total) by mouth every 6 (six) hours as needed for anxiety. 60 tablet 0   Magnesium 300 MG CAPS  Take 300 mg by mouth every evening.     Multiple Vitamins-Minerals (MULTIVITAMIN WITH MINERALS) tablet Take 1 tablet by mouth daily.     ondansetron (ZOFRAN) 8 MG tablet Take 1 tablet (8 mg total) by mouth every 8 (eight) hours as needed for nausea. 30 tablet 3   prochlorperazine (COMPAZINE) 10 MG tablet Take 1 tablet (10 mg total) by mouth every 6 (six) hours as needed for nausea or vomiting. 30 tablet 0   Sodium Chloride Flush (NORMAL SALINE FLUSH) 0.9 % SOLN Inject 10 mLs into the vein daily. 300 mL 3   Sodium Chloride Flush (NORMAL SALINE FLUSH) 0.9 % SOLN Please flush 10 ml normal saline daily 300 mL 3   traZODone (DESYREL) 50 MG tablet Take 1 tablet (50 mg total) by mouth at bedtime. 30 tablet 0   No current facility-administered medications for this visit.    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 Negative for acute PE or thoracic aortic dissection. 2. Encasement and narrowing of the right upper lobe pulmonary artery by tumor. 3. Stable bilateral pulmonary nodules, hilar and mediastinal adenopathy. 4. Progressive consolidation at the right lung base   12/04/2021 Imaging   1. A mixed solid and cystic mass replaces the left ovary measuring roughly 6.8 x 11.0 x 9.2 cm in greatest dimension. This may represent a primary ovarian mass or an ovarian metastasis. 2. Multiple pathologically enlarged lymph nodes within the gastrohepatic ligament, consistent with metastatic disease. 3. Innumerable pulmonary masses within the visualized lung bases bilaterally, better evaluated on previously performed dedicated CT imaging of the chest. 4. Small left pleural effusion. 5. Cholelithiasis without pericholecystic inflammatory change.     12/05/2021 Pathology Results   LUNG, RIGHT LOWER LOBE, NEEDLE CORE BIOPSY:  Non-small cell carcinoma, poorly differentiated with necrosis (see comment)   COMMENT:   Sections show  multiple cores of pulmonary parenchyma multifocally infiltrated by sheets and nests of large, atypical cells with variably large round to oval nuclei and scattered small inconspicuous nucleoli.  There are foci of geographic tumor necrosis.  In areas focally the tumor shows features suggestive of a squamous differentiation. Sixteen immunohistochemical stains are performed with adequate control.  The  tumor is positive for pancytokeratin (AE1/AE3).  The tumor also shows patchy positivity for high molecular weight cytokeratin 5/6.  The tumor is negative for cytokeratin 7 and cytokeratin 20.  The tumor is negative for the squamous markers p40 and p63.  The tumor is negative for the pulmonary adeno markers Napsin A and TTF-1.  The tumor is negative for the GYN and renal marker PAX8.  The tumor is negative for the GYN and mesothelial markers WT1 the tumor shows patchy positivity for the neuroendocrine markers synaptophysin and CD56 the tumor also shows variable positivity for p53.  The tumor shows variable strong positivity for p16.  The tumor is negative for estrogen receptor and progesterone receptor.   In summary the cores show a poorly differentiated non-small cell carcinoma with necrosis exhibiting focal neuroendocrine differentiation by immunohistochemistry.  This  immunohistochemical pattern does not  suggest a primary for this tumor.  The cytokeratin 5/6 positivity, although commonly used for squamous differentiation within pulmonary tumors, may be seen in high-grade ovarian tumors particularly serous  carcinomas.  Clinically the patient is reported to have a large ovarian mass with multiple pulmonary nodules and extensive abdominal and mediastinal adenopathy.  Given this history, the p16 and p53 positivity  may be seen in ovarian tumors, but they may also be seen in tumors from other sites.  Also the tumor is negative for the more specific gynecologic markers cytokeratin 7, PAX8 and WT1.  Additional  clinical  and radiologic correlation is recommended and an ovarian mass biopsy is suggested.  With additional tissue molecular testing would be possible if needed.    12/05/2021 Procedure   Successful CT-guided core needle biopsy of right lower lung pulmonary nodule/mass.    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 Potential mixed response to interval therapy. The mediastinal adenopathy and extensive pulmonary nodularity demonstrated previously have improved in the interval. 2. There are multiple newly visible low-density hepatic lesions which are suspicious for metastatic disease. These may be visible due to interval treatment or reflect new lesions. 3. The complex solid and cystic adnexal mass has mildly enlarged from the prior study (primarily the cystic right-sided component). 4. Upper abdominal adenopathy has not significantly changed. 5. No acute findings identified.     01/03/2022 Imaging   Innumerable enhancing lesions scattered throughout the brain parenchyma consistent with metastatic disease to the brain   01/14/2022 Imaging   Stable findings of pulmonary metastatic disease and mediastinal lymphadenopathy. No new acute findings   01/14/2022 Imaging   1. No evidence of significant pulmonary embolus. 2. Extensive malignancy demonstrated throughout the chest. Prominent lymphadenopathy throughout the mediastinum, hilar, and peribronchovascular regions. Right hilar/suprahilar mass lesion. 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 3. Lymphadenopathy causes new extrinsic compression of the SVC resulting in SVC  obstruction. 4. Metastatic lymphadenopathy in the celiac axis of the upper abdomen and probably in the liver. 5. Extrinsic compression of right middle lobe and right upper lobe bronchi with postobstructive changes.   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 [facility])   12/07/2021 - 12/07/2021 Chemotherapy   Patient is on Treatment Plan : OVARIAN Carboplatin (AUC 6) / Paclitaxel (175) q21d x 6 cycles     12/30/2021 -  Chemotherapy   Patient is on Treatment Plan : LUNG SCLC Carboplatin + Etoposide + Atezolizumab Induction q21d / Atezolizumab Maintenance q21d       PHYSICAL EXAMINATION: ECOG PERFORMANCE STATUS: 2 - Symptomatic, <50% confined to bed  Vitals:   01/17/22 1156  BP: 118/71  Pulse: (!) 105  Resp: 18  Temp: 98.5 F (36.9 C)  SpO2: 93%   Filed Weights   01/17/22 1156  Weight: 146 lb 9.6 oz (66.5 kg)    GENERAL:alert, no distress and comfortable. She has oxygen delivered via nasal cannula SKIN: skin color, texture, turgor are normal, no rashes or significant lesions EYES: normal, Conjunctiva are pink and non-injected, sclera clear  OROPHARYNX:no exudate, no erythema and lips, buccal mucosa, and tongue normal  NECK: supple, thyroid normal size, non-tender, without nodularity LYMPH:  no palpable lymphadenopathy in the cervical, axillary or inguinal LUNGS: noted expiratory wheeze and mild increased work of breathing HEART: regular rate & rhythm and no murmurs and no lower extremity edema ABDOMEN:abdomen soft, non-tender and normal bowel sounds Musculoskeletal:no cyanosis of digits and no clubbing  NEURO: alert & oriented x 3 with fluent speech, no focal motor/sensory deficits  LABORATORY DATA:  I have reviewed the data as listed    Component Value Date/Time   NA 132 (L) 01/14/2022 0814   K 3.6 01/14/2022 0814   CL 101 01/14/2022 0814   CO2 20 (L) 01/14/2022 0814    GLUCOSE 172 (H) 01/14/2022 0814   BUN 28 (H) 01/14/2022 0814   CREATININE 0.73 01/14/2022 0814   CALCIUM 9.3 01/14/2022 0814   PROT 7.0 01/14/2022 0814   ALBUMIN 3.9 01/14/2022 0814   AST 46 (H) 01/14/2022 0814   ALT 23 01/14/2022 0814   ALKPHOS 170 (H) 01/14/2022 0814   BILITOT 0.3 01/14/2022 0814   GFRNONAA >60 01/14/2022 0814    No results found for: "SPEP", "UPEP"  Lab Results  Component Value Date   WBC 19.1 (H) 01/14/2022   NEUTROABS 12.9 (H) 01/14/2022   HGB 12.2 01/14/2022   HCT 34.8 (L) 01/14/2022   MCV 87.9 01/14/2022   PLT 202 01/14/2022      Chemistry      Component Value Date/Time   NA 132 (L) 01/14/2022 0814   K 3.6 01/14/2022 0814   CL 101 01/14/2022 0814   CO2 20 (L) 01/14/2022 0814   BUN 28 (H) 01/14/2022 0814   CREATININE 0.73 01/14/2022 0814      Component Value Date/Time   CALCIUM 9.3 01/14/2022 0814   ALKPHOS 170 (H) 01/14/2022 0814   AST 46 (H) 01/14/2022 0814   ALT 23 01/14/2022 0814   BILITOT 0.3 01/14/2022 0814       RADIOGRAPHIC STUDIES: I have personally reviewed the radiological images as listed and agreed with the findings in the report. CT Angio Chest Pulmonary Embolism (PE) W or WO Contrast  Result Date: 01/14/2022 CLINICAL DATA:  Pulmonary embolus suspected with high probability. EXAM: CT ANGIOGRAPHY CHEST WITH CONTRAST TECHNIQUE: Multidetector CT imaging of the chest was performed using the standard protocol during bolus administration of intravenous contrast. Multiplanar CT image reconstructions and MIPs were obtained to evaluate the vascular anatomy. RADIATION DOSE REDUCTION: This exam was performed according to the departmental dose-optimization program which includes automated exposure control, adjustment of the mA and/or kV according to patient size and/or use of iterative reconstruction technique. CONTRAST:  64m OMNIPAQUE IOHEXOL 350 MG/ML SOLN COMPARISON:  01/02/2022 FINDINGS: Cardiovascular: Good opacification of the central  and segmental pulmonary arteries. No focal filling defects. No evidence of significant pulmonary embolus. Normal heart size. Small pericardial effusion. Normal caliber thoracic aorta. No aortic dissection. Great vessel origins are patent. A right central venous catheter is present with tip at the lower SVC. The superior vena cava is extrinsically compressed and is diminutive in caliber. This represents a change since previous study. Mediastinum/Nodes: Extensive lymphadenopathy throughout the mediastinum and hilar regions. Right paratracheal lymph nodes measure up to about 4.7 cm short axis dimension. Prominent lymph nodes extending along the pulmonary arteries and bronchi. Right hilar/suprahilar mass lesion measuring up to about 4.6 cm diameter. This is causing extrinsic compression of right middle lobe and right upper lobe bronchi. Esophagus is  decompressed. Thyroid gland is unremarkable. Lungs/Pleura: Multiple pulmonary nodules and confluent masses demonstrated throughout. Nodules are mostly in a perihilar distribution. Interstitial changes in the lung bases may represent interstitial carcinomatosis. No pleural effusions. No pneumothorax. Upper Abdomen: Retrocrural and celiac axis lymphadenopathy. Heterogeneous parenchymal pattern to the liver. This is incompletely evaluated due to early phase of contrast but likely metastatic. Musculoskeletal: No bone metastasis are demonstrated. Review of the MIP images confirms the above findings. IMPRESSION: 1. No evidence of significant pulmonary embolus. 2. Extensive malignancy demonstrated throughout the chest. Prominent lymphadenopathy throughout the mediastinum, hilar, and peribronchovascular regions. Right hilar/suprahilar mass lesion. 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 3. Lymphadenopathy causes new extrinsic compression of the SVC resulting in SVC obstruction. 4. Metastatic lymphadenopathy  in the celiac axis of the upper abdomen and probably in the liver. 5. Extrinsic compression of right middle lobe and right upper lobe bronchi with postobstructive changes. Electronically Signed   By: Lucienne Capers M.D.   On: 01/14/2022 17:15   DG Chest 2 View  Result Date: 01/14/2022 CLINICAL DATA:  metastatic cancer to lungs worsening SOB EXAM: CHEST - 2 VIEW COMPARISON:  Chest CT 01/02/2022, radiograph 12/10/2021 FINDINGS: Right upper extremity catheter tip overlies the distal superior vena cava. Unchanged cardiomediastinal silhouette with known lymphadenopathy. There are unchanged nodular and reticular opacities bilaterally consistent with known metastatic disease. No pleural effusion. No pneumothorax. Bones are unchanged. IMPRESSION: Stable findings of pulmonary metastatic disease and mediastinal lymphadenopathy. No new acute findings. Electronically Signed   By: Maurine Simmering M.D.   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 Headache, new or worsening, cancer suspected (Age 40-49y); Small cell lung cancer (SCLC), assess treatment response; headaches, nausea and vomiting, recent diagnosis of small cell lung cancer; lymphangitic lung metastasis with unknown primary site, unspecified laterality (HCC) C78.00, C80.1 (ICD-10-CM). EXAM: MRI HEAD WITHOUT AND WITH CONTRAST TECHNIQUE: Multiplanar, multiecho pulse sequences of the brain and surrounding structures were obtained without and with intravenous contrast. CONTRAST:  45m GADAVIST GADOBUTROL 1 MMOL/ML IV SOLN COMPARISON:  None Available. FINDINGS: Brain: Innumerable enhancing lesions are seen scattered throughout the brain parenchyma including supratentorial and infratentorial compartments, consistent with metastatic disease to the brain. Dominant lesions in the left cerebellar hemisphere (2.8 cm), left frontal lobe (1.5 cm) and left  thalamus (1.2 cm) with surrounding vasogenic edema, particularly at the left cerebellar hemisphere with mass effect on the fourth ventricle without evidence of hydrocephalus or entrapment. No significant midline shift. No acute infarction, hemorrhage or extra-axial collection. Vascular: Normal flow voids. Skull and upper cervical spine: Normal marrow signal. Sinuses/Orbits: Negative. Other: None. IMPRESSION: Innumerable enhancing lesions scattered throughout the brain parenchyma consistent with metastatic disease to the brain. Electronically Signed   By: KPedro EarlsM.D.   On: 01/03/2022 17:13   CT CHEST ABDOMEN PELVIS W CONTRAST  Result Date: 01/03/2022 CLINICAL DATA:  Small cell lung cancer. Assess treatment response. Patient reports weakness, dizziness and shortness of breath. * Tracking Code: BO * EXAM: CT CHEST, ABDOMEN, AND PELVIS WITH CONTRAST TECHNIQUE: Multidetector CT imaging of the chest, abdomen and pelvis was performed following the standard protocol during bolus administration of intravenous contrast. RADIATION DOSE REDUCTION: This exam was performed according to the departmental dose-optimization program which includes automated exposure control, adjustment of the mA and/or kV according to patient size and/or use of iterative  reconstruction technique. CONTRAST:  158m OMNIPAQUE IOHEXOL 300 MG/ML  SOLN COMPARISON:  Prior CTs 12/04/2021. FINDINGS: CT CHEST FINDINGS Cardiovascular: No acute vascular findings are seen, although there is suboptimal opacification of pulmonary arteries. A right arm PICC extends to the superior cavoatrial junction. The heart size is normal. There is no pericardial effusion. Mediastinum/Nodes: Previously demonstrated bulky mediastinal and hilar adenopathy appears mildly improved from the previous study. A right paratracheal nodal mass measures 4.1 x 2.6 cm on image 20/2 (previously 4.7 x 3.2 cm). A subcarinal node has a short axis dimension of 2.0 cm on  image 31/2 (previously 3.4 cm). No axillary adenopathy. The thyroid gland, trachea and esophagus demonstrate no significant findings. Lungs/Pleura: Trace right pleural effusion. No pneumothorax. Extensive pulmonary nodularity bilaterally has moderately improved in the interval. A representative nodule in the right lower lobe currently measures 1.3 cm on image 73/5 (previously 2.7 cm). There are less confluent nodules and adjacent airspace disease in both lower lobes and the right middle lobe. No enlarging lung masses identified. Musculoskeletal/Chest wall: No chest wall mass or suspicious osseous findings. CT ABDOMEN AND PELVIS FINDINGS Hepatobiliary: There are numerous new low-density lesions throughout the liver. Representative lesions include a 1.5 x 1.2 cm lesion in the right lobe (segment 6, image 62/2), a 1.1 cm lesion centrally in the right lobe (segment 8, image 54/2) and a 1.6 cm lesion in the left lobe (segment 3, image 60/2). A larger area of ill-defined low-density peripherally in the left lobe measures 3.9 x 1.8 cm on image 57/2. No evidence of gallstones, gallbladder wall thickening or biliary dilatation. Pancreas: Unremarkable. No pancreatic ductal dilatation or surrounding inflammatory changes. Spleen: Normal in size without focal abnormality. Adrenals/Urinary Tract: Both adrenal glands appear normal. The kidneys appear normal without evidence of urinary tract calculus, suspicious lesion or hydronephrosis. The bladder appears normal for its degree of distention. Stomach/Bowel: Some enteric contrast was administered and has passed into the distal small bowel. The stomach appears unremarkable for its degree of distension. No evidence of bowel wall thickening, distention or surrounding inflammatory change. Prominent stool throughout the colon consistent with constipation. Vascular/Lymphatic: Gastrohepatic ligament adenopathy appears similar to the previous study, measuring up to 1.4 cm short axis on  image 57/2. No progressive abdominopelvic adenopathy. No acute vascular findings. Reproductive: Again demonstrated is a large complex solid and cystic pelvic mass. This previously appeared separate from the right ovary. Currently, the right-sided low-density component abuts the right ovary and has mildly enlarged from the prior study, measuring 8.2 x 5.6 cm on image 107/2 (previously 7.2 x 5.2 cm). The more solid-appearing left-sided component is similar, measuring approximately 6.7 x 6.1 cm on image 106/2. Overall greatest dimension is approximately 8.6 cm on image 106/2 (previously 11.0 cm). Other: No ascites, free air or peritoneal nodularity. Musculoskeletal: No acute or significant osseous findings. IMPRESSION: 1. Potential mixed response to interval therapy. The mediastinal adenopathy and extensive pulmonary nodularity demonstrated previously have improved in the interval. 2. There are multiple newly visible low-density hepatic lesions which are suspicious for metastatic disease. These may be visible due to interval treatment or reflect new lesions. 3. The complex solid and cystic adnexal mass has mildly enlarged from the prior study (primarily the cystic right-sided component). 4. Upper abdominal adenopathy has not significantly changed. 5. No acute findings identified. Electronically Signed   By: WRichardean SaleM.D.   On: 01/03/2022 15:41

## 2022-01-19 NOTE — Assessment & Plan Note (Signed)
This has improved with twice daily dexamethasone She will continue the same until after completion of radiation treatment

## 2022-01-19 NOTE — Assessment & Plan Note (Signed)
I have reviewed multiple imaging studies with her and her husband She will continue radiation therapy as scheduled She will proceed with chemo as scheduled

## 2022-01-20 ENCOUNTER — Ambulatory Visit
Admission: RE | Admit: 2022-01-20 | Discharge: 2022-01-20 | Disposition: A | Discharge status: Home / Self Care | Payer: BC Managed Care – PPO | Source: Ambulatory Visit | Attending: Radiation Oncology | Admitting: Radiation Oncology

## 2022-01-20 ENCOUNTER — Other Ambulatory Visit: Payer: Self-pay

## 2022-01-20 ENCOUNTER — Inpatient Hospital Stay: Payer: BC Managed Care – PPO

## 2022-01-20 VITALS — BP 118/62 | HR 123 | Temp 98.3°F | Resp 20 | Wt 143.8 lb

## 2022-01-20 DIAGNOSIS — Z5112 Encounter for antineoplastic immunotherapy: Secondary | ICD-10-CM | POA: Diagnosis not present

## 2022-01-20 DIAGNOSIS — C801 Malignant (primary) neoplasm, unspecified: Secondary | ICD-10-CM

## 2022-01-20 DIAGNOSIS — C7801 Secondary malignant neoplasm of right lung: Secondary | ICD-10-CM

## 2022-01-20 DIAGNOSIS — C7800 Secondary malignant neoplasm of unspecified lung: Secondary | ICD-10-CM

## 2022-01-20 LAB — RAD ONC ARIA SESSION SUMMARY
Course Elapsed Days: 11
Plan Fractions Treated to Date: 3
Plan Fractions Treated to Date: 8
Plan Prescribed Dose Per Fraction: 2.5 Gy
Plan Prescribed Dose Per Fraction: 3 Gy
Plan Total Fractions Prescribed: 10
Plan Total Fractions Prescribed: 14
Plan Total Prescribed Dose: 30 Gy
Plan Total Prescribed Dose: 35 Gy
Reference Point Dosage Given to Date: 20 Gy
Reference Point Dosage Given to Date: 9 Gy
Reference Point Session Dosage Given: 2.5 Gy
Reference Point Session Dosage Given: 3 Gy
Session Number: 8

## 2022-01-20 MED ORDER — HEPARIN SOD (PORK) LOCK FLUSH 100 UNIT/ML IV SOLN
250.0000 [IU] | Freq: Once | INTRAVENOUS | Status: AC | PRN
  Administered 2022-01-20: 250 [IU]

## 2022-01-20 MED ORDER — SODIUM CHLORIDE 0.9 % IV SOLN
630.0000 mg | Freq: Once | INTRAVENOUS | Status: AC
  Administered 2022-01-20: 630 mg via INTRAVENOUS
  Filled 2022-01-20: qty 63

## 2022-01-20 MED ORDER — SODIUM CHLORIDE 0.9 % IV SOLN
10.0000 mg | Freq: Once | INTRAVENOUS | Status: AC
  Administered 2022-01-20: 10 mg via INTRAVENOUS
  Filled 2022-01-20: qty 10

## 2022-01-20 MED ORDER — SODIUM CHLORIDE 0.9 % IV SOLN
150.0000 mg | Freq: Once | INTRAVENOUS | Status: AC
  Administered 2022-01-20: 150 mg via INTRAVENOUS
  Filled 2022-01-20: qty 150

## 2022-01-20 MED ORDER — PALONOSETRON HCL INJECTION 0.25 MG/5ML
0.2500 mg | Freq: Once | INTRAVENOUS | Status: AC
  Administered 2022-01-20: 0.25 mg via INTRAVENOUS
  Filled 2022-01-20: qty 5

## 2022-01-20 MED ORDER — SODIUM CHLORIDE 0.9% FLUSH
10.0000 mL | INTRAVENOUS | Status: DC | PRN
  Administered 2022-01-20: 10 mL

## 2022-01-20 MED ORDER — SODIUM CHLORIDE 0.9 % IV SOLN: Freq: Once | INTRAVENOUS | Status: AC

## 2022-01-20 MED ORDER — SODIUM CHLORIDE 0.9 % IV SOLN
100.0000 mg/m2 | Freq: Once | INTRAVENOUS | Status: AC
  Administered 2022-01-20: 180 mg via INTRAVENOUS
  Filled 2022-01-20: qty 9

## 2022-01-20 MED ORDER — SODIUM CHLORIDE 0.9 % IV SOLN
1200.0000 mg | Freq: Once | INTRAVENOUS | Status: AC
  Administered 2022-01-20: 1200 mg via INTRAVENOUS
  Filled 2022-01-20: qty 20

## 2022-01-20 MED FILL — Dexamethasone Sodium Phosphate Inj 100 MG/10ML: INTRAMUSCULAR | Qty: 1 | Status: AC

## 2022-01-20 NOTE — Progress Notes (Signed)
Ok to treat today with heart rate of 115 per Dr. Alvy Bimler.

## 2022-01-20 NOTE — Progress Notes (Signed)
Nutrition Assessment:  Referral from inpatient team.  41 year old female with lymphangitic lung metastasis with unknown primary.  Patient receiving carboplatin, etoposide, atezolizumab.    Met with patient during infusion.  Patient reports poor po intake.  Cough contributing to ability to take in oral intake.  Able to eat 1/2 peanut butter sandwich this am.  Dinner last night was meatloaf, mashed potatoes and fruit.  Breakfast yesterday was power waffle.  Lunch yesterday was toast.  "Milky shakes" increase phlegm and unable to tolerate.       Medications: MVI, zofran, compazine  Labs: Na 132, glucose 172, BUN 28, creatinine 0/73  Anthropometrics:   Height: 65 inches Weight: 143 lb 12 oz 156 lb 15.5 oz on 12/06/21 BMI: 23  8% weight loss in the last month and half, significant   Estimated Energy Needs  Kcals: 5615-3794 Protein: 98-114 g Fluid: 3276-1470  NUTRITION DIAGNOSIS: Inadequate oral intake related to cancer as evidenced by 8% weight loss in the last month and half, poor po intake   INTERVENTION:  Recommend "juice" shakes to try (ensure clear, boost breeze, boost soothe).   Discussed ways to add calories and protein to diet with low volume of food being eaten.  High Calorie, High Protein handout given to patient.  Patient may benefit from trial of appetite stimulant    MONITORING, EVALUATION, GOAL: weight trends, intake   NEXT VISIT: to be determined with treatment  Ariez Neilan B. Zenia Resides, [place], [place] Registered Dietitian [phone]

## 2022-01-20 NOTE — Patient Instructions (Signed)
[facility]  Discharge Instructions: Thank you for choosing [facility] to provide your oncology and hematology care.   If you have a lab appointment with the [facility], please go directly to the [facility] and check in at the registration area.   Wear comfortable clothing and clothing appropriate for easy access to any Portacath or PICC line.   We strive to give you quality time with your provider. You may need to reschedule your appointment if you arrive late (15 or more minutes).  Arriving late affects you and other patients whose appointments are after yours.  Also, if you miss three or more appointments without notifying the office, you may be dismissed from the clinic at the provider's discretion.      For prescription refill requests, have your pharmacy contact our office and allow 72 hours for refills to be completed.    Today you received the following chemotherapy and/or immunotherapy agents: Tecentriq, Carboplatin, Etoposide      To help prevent nausea and vomiting after your treatment, we encourage you to take your nausea medication as directed.  BELOW ARE SYMPTOMS THAT SHOULD BE REPORTED IMMEDIATELY: *FEVER GREATER THAN 100.4 F (38 C) OR HIGHER *CHILLS OR SWEATING *NAUSEA AND VOMITING THAT IS NOT CONTROLLED WITH YOUR NAUSEA MEDICATION *UNUSUAL SHORTNESS OF BREATH *UNUSUAL BRUISING OR BLEEDING *URINARY PROBLEMS (pain or burning when urinating, or frequent urination) *BOWEL PROBLEMS (unusual diarrhea, constipation, pain near the anus) TENDERNESS IN MOUTH AND THROAT WITH OR WITHOUT PRESENCE OF ULCERS (sore throat, sores in mouth, or a toothache) UNUSUAL RASH, SWELLING OR PAIN  UNUSUAL VAGINAL DISCHARGE OR ITCHING   Items with * indicate a potential emergency and should be followed up as soon as possible or go to the Emergency Department if any problems should occur.  Please show the CHEMOTHERAPY ALERT CARD or IMMUNOTHERAPY  ALERT CARD at check-in to the Emergency Department and triage nurse.  Should you have questions after your visit or need to cancel or reschedule your appointment, please contact [facility]  Dept: [phone]  and follow the prompts.  Office hours are 8:00 a.m. to 4:30 p.m. Monday - Friday. Please note that voicemails left after 4:00 p.m. may not be returned until the following business day.  We are closed weekends and major holidays. You have access to a nurse at all times for urgent questions. Please call the main number to the clinic Dept: [phone] and follow the prompts.   For any non-urgent questions, you may also contact your provider using MyChart. We now offer e-Visits for anyone 4 and older to request care online for non-urgent symptoms. For details visit [email or web address].   Also download the MyChart app! Go to the app store, search "MyChart", open the app, select [facility], and log in with your MyChart username and password.  Masks are optional in the cancer centers. If you would like for your care team to wear a mask while they are taking care of you, please let them know. For doctor visits, patients may have with them one support person who is at least 40 years old. At this time, visitors are not allowed in the infusion area. Atezolizumab injection What is this medication? ATEZOLIZUMAB (a te zoe LIZ ue mab) is a monoclonal antibody. It is used to treat bladder cancer (urothelial cancer), liver cancer, lung cancer, and melanoma. This medicine may be used for other purposes; ask your health care provider or pharmacist if you  have questions. COMMON BRAND NAME(S): Tecentriq What should I tell my care team before I take this medication? They need to know if you have any of these conditions: autoimmune diseases like Crohn's disease, ulcerative colitis, or lupus have had or planning to have an allogeneic stem cell transplant (uses someone else's  stem cells) history of organ transplant history of radiation to the chest nervous system problems like myasthenia gravis or Guillain-Barre syndrome an unusual or allergic reaction to atezolizumab, other medicines, foods, dyes, or preservatives pregnant or trying to get pregnant breast-feeding How should I use this medication? This medicine is for infusion into a vein. It is given by a health care professional in a hospital or clinic setting. A special MedGuide will be given to you before each treatment. Be sure to read this information carefully each time. Talk to your pediatrician regarding the use of this medicine in children. Special care may be needed. Overdosage: If you think you have taken too much of this medicine contact a poison control center or emergency room at once. NOTE: This medicine is only for you. Do not share this medicine with others. What if I miss a dose? It is important not to miss your dose. Call your doctor or health care professional if you are unable to keep an appointment. What may interact with this medication? Interactions have not been studied. This list may not describe all possible interactions. Give your health care provider a list of all the medicines, herbs, non-prescription drugs, or dietary supplements you use. Also tell them if you smoke, drink alcohol, or use illegal drugs. Some items may interact with your medicine. What should I watch for while using this medication? Your condition will be monitored carefully while you are receiving this medicine. You may need blood work done while you are taking this medicine. Do not become pregnant while taking this medicine or for at least 5 months after stopping it. Women should inform their doctor if they wish to become pregnant or think they might be pregnant. There is a potential for serious side effects to an unborn child. Talk to your health care professional or pharmacist for more information. Do not  breast-feed an infant while taking this medicine or for at least 5 months after the last dose. What side effects may I notice from receiving this medication? Side effects that you should report to your doctor or health care professional as soon as possible: allergic reactions like skin rash, itching or hives, swelling of the face, lips, or tongue black, tarry stools bloody or watery diarrhea breathing problems changes in vision chest pain or chest tightness chills facial flushing fever headache signs and symptoms of high blood sugar such as dizziness; dry mouth; dry skin; fruity breath; nausea; stomach pain; increased hunger or thirst; increased urination signs and symptoms of liver injury like dark yellow or brown urine; general ill feeling or flu-like symptoms; light-colored stools; loss of appetite; nausea; right upper belly pain; unusually weak or tired; yellowing of the eyes or skin stomach pain trouble passing urine or change in the amount of urine Side effects that usually do not require medical attention (report to your doctor or health care professional if they continue or are bothersome): bone pain cough diarrhea joint pain muscle pain muscle weakness swelling of arms or legs tiredness weight loss This list may not describe all possible side effects. Call your doctor for medical advice about side effects. You may report side effects to FDA at 1-800-FDA-1088. Where should  I keep my medication? This drug is given in a hospital or clinic and will not be stored at home. NOTE: This sheet is a summary. It may not cover all possible information. If you have questions about this medicine, talk to your doctor, pharmacist, or health care provider.  2023 Elsevier/Gold Standard (2021-05-10 00:00:00) Carboplatin injection What is this medication? CARBOPLATIN (KAR boe pla tin) is a chemotherapy drug. It targets fast dividing cells, like cancer cells, and causes these cells to die. This  medicine is used to treat ovarian cancer and many other cancers. This medicine may be used for other purposes; ask your health care provider or pharmacist if you have questions. COMMON BRAND NAME(S): Paraplatin What should I tell my care team before I take this medication? They need to know if you have any of these conditions: blood disorders hearing problems kidney disease recent or ongoing radiation therapy an unusual or allergic reaction to carboplatin, cisplatin, other chemotherapy, other medicines, foods, dyes, or preservatives pregnant or trying to get pregnant breast-feeding How should I use this medication? This drug is usually given as an infusion into a vein. It is administered in a hospital or clinic by a specially trained health care professional. Talk to your pediatrician regarding the use of this medicine in children. Special care may be needed. Overdosage: If you think you have taken too much of this medicine contact a poison control center or emergency room at once. NOTE: This medicine is only for you. Do not share this medicine with others. What if I miss a dose? It is important not to miss a dose. Call your doctor or health care professional if you are unable to keep an appointment. What may interact with this medication? medicines for seizures medicines to increase blood counts like filgrastim, pegfilgrastim, sargramostim some antibiotics like amikacin, gentamicin, neomycin, streptomycin, tobramycin vaccines Talk to your doctor or health care professional before taking any of these medicines: acetaminophen aspirin ibuprofen ketoprofen naproxen This list may not describe all possible interactions. Give your health care provider a list of all the medicines, herbs, non-prescription drugs, or dietary supplements you use. Also tell them if you smoke, drink alcohol, or use illegal drugs. Some items may interact with your medicine. What should I watch for while using this  medication? Your condition will be monitored carefully while you are receiving this medicine. You will need important blood work done while you are taking this medicine. This drug may make you feel generally unwell. This is not uncommon, as chemotherapy can affect healthy cells as well as cancer cells. Report any side effects. Continue your course of treatment even though you feel ill unless your doctor tells you to stop. In some cases, you may be given additional medicines to help with side effects. Follow all directions for their use. Call your doctor or health care professional for advice if you get a fever, chills or sore throat, or other symptoms of a cold or flu. Do not treat yourself. This drug decreases your body's ability to fight infections. Try to avoid being around people who are sick. This medicine may increase your risk to bruise or bleed. Call your doctor or health care professional if you notice any unusual bleeding. Be careful brushing and flossing your teeth or using a toothpick because you may get an infection or bleed more easily. If you have any dental work done, tell your dentist you are receiving this medicine. Avoid taking products that contain aspirin, acetaminophen, ibuprofen, naproxen, or ketoprofen  unless instructed by your doctor. These medicines may hide a fever. Do not become pregnant while taking this medicine. Women should inform their doctor if they wish to become pregnant or think they might be pregnant. There is a potential for serious side effects to an unborn child. Talk to your health care professional or pharmacist for more information. Do not breast-feed an infant while taking this medicine. What side effects may I notice from receiving this medication? Side effects that you should report to your doctor or health care professional as soon as possible: allergic reactions like skin rash, itching or hives, swelling of the face, lips, or tongue signs of infection -  fever or chills, cough, sore throat, pain or difficulty passing urine signs of decreased platelets or bleeding - bruising, pinpoint red spots on the skin, black, tarry stools, nosebleeds signs of decreased red blood cells - unusually weak or tired, fainting spells, lightheadedness breathing problems changes in hearing changes in vision chest pain high blood pressure low blood counts - This drug may decrease the number of white blood cells, red blood cells and platelets. You may be at increased risk for infections and bleeding. nausea and vomiting pain, swelling, redness or irritation at the injection site pain, tingling, numbness in the hands or feet problems with balance, talking, walking trouble passing urine or change in the amount of urine Side effects that usually do not require medical attention (report to your doctor or health care professional if they continue or are bothersome): hair loss loss of appetite metallic taste in the mouth or changes in taste This list may not describe all possible side effects. Call your doctor for medical advice about side effects. You may report side effects to FDA at 1-800-FDA-1088. Where should I keep my medication? This drug is given in a hospital or clinic and will not be stored at home. NOTE: This sheet is a summary. It may not cover all possible information. If you have questions about this medicine, talk to your doctor, pharmacist, or health care provider.  2023 Elsevier/Gold Standard (2007-11-17 00:00:00) Etoposide, VP-16 injection What is this medication? ETOPOSIDE, VP-16 (e toe POE side) is a chemotherapy drug. It is used to treat testicular cancer, lung cancer, and other cancers. This medicine may be used for other purposes; ask your health care provider or pharmacist if you have questions. COMMON BRAND NAME(S): Etopophos, Toposar, VePesid What should I tell my care team before I take this medication? They need to know if you have any of  these conditions: infection kidney disease liver disease low blood counts, like low white cell, platelet, or red cell counts an unusual or allergic reaction to etoposide, other medicines, foods, dyes, or preservatives pregnant or trying to get pregnant breast-feeding How should I use this medication? This medicine is for infusion into a vein. It is administered in a hospital or clinic by a specially trained health care professional. Talk to your pediatrician regarding the use of this medicine in children. Special care may be needed. Overdosage: If you think you have taken too much of this medicine contact a poison control center or emergency room at once. NOTE: This medicine is only for you. Do not share this medicine with others. What if I miss a dose? It is important not to miss your dose. Call your doctor or health care professional if you are unable to keep an appointment. What may interact with this medication? This medicine may interact with the following medications: warfarin This list may  not describe all possible interactions. Give your health care provider a list of all the medicines, herbs, non-prescription drugs, or dietary supplements you use. Also tell them if you smoke, drink alcohol, or use illegal drugs. Some items may interact with your medicine. What should I watch for while using this medication? Visit your doctor for checks on your progress. This drug may make you feel generally unwell. This is not uncommon, as chemotherapy can affect healthy cells as well as cancer cells. Report any side effects. Continue your course of treatment even though you feel ill unless your doctor tells you to stop. In some cases, you may be given additional medicines to help with side effects. Follow all directions for their use. Call your doctor or health care professional for advice if you get a fever, chills or sore throat, or other symptoms of a cold or flu. Do not treat yourself. This drug  decreases your body's ability to fight infections. Try to avoid being around people who are sick. This medicine may increase your risk to bruise or bleed. Call your doctor or health care professional if you notice any unusual bleeding. Talk to your doctor about your risk of cancer. You may be more at risk for certain types of cancers if you take this medicine. Do not become pregnant while taking this medicine or for at least 6 months after stopping it. Women should inform their doctor if they wish to become pregnant or think they might be pregnant. Women of child-bearing potential will need to have a negative pregnancy test before starting this medicine. There is a potential for serious side effects to an unborn child. Talk to your health care professional or pharmacist for more information. Do not breast-feed an infant while taking this medicine. Men must use a latex condom during sexual contact with a woman while taking this medicine and for at least 4 months after stopping it. A latex condom is needed even if you have had a vasectomy. Contact your doctor right away if your partner becomes pregnant. Do not donate sperm while taking this medicine and for at least 4 months after you stop taking this medicine. Men should inform their doctors if they wish to father a child. This medicine may lower sperm counts. What side effects may I notice from receiving this medication? Side effects that you should report to your doctor or health care professional as soon as possible: allergic reactions like skin rash, itching or hives, swelling of the face, lips, or tongue low blood counts - this medicine may decrease the number of white blood cells, red blood cells, and platelets. You may be at increased risk for infections and bleeding nausea, vomiting redness, blistering, peeling or loosening of the skin, including inside the mouth signs and symptoms of infection like fever; chills; cough; sore throat; pain or  trouble passing urine signs and symptoms of low red blood cells or anemia such as unusually weak or tired; feeling faint or lightheaded; falls; breathing problems unusual bruising or bleeding Side effects that usually do not require medical attention (report to your doctor or health care professional if they continue or are bothersome): changes in taste diarrhea hair loss loss of appetite mouth sores This list may not describe all possible side effects. Call your doctor for medical advice about side effects. You may report side effects to FDA at 1-800-FDA-1088. Where should I keep my medication? This drug is given in a hospital or clinic and will not be stored at home.  NOTE: This sheet is a summary. It may not cover all possible information. If you have questions about this medicine, talk to your doctor, pharmacist, or health care provider.  2023 Elsevier/Gold Standard (2021-05-10 00:00:00)

## 2022-01-21 ENCOUNTER — Institutional Professional Consult (permissible substitution): Payer: BC Managed Care – PPO | Admitting: Pulmonary Disease

## 2022-01-21 ENCOUNTER — Ambulatory Visit
Admission: RE | Admit: 2022-01-21 | Discharge: 2022-01-21 | Disposition: A | Discharge status: Home / Self Care | Payer: BC Managed Care – PPO | Source: Ambulatory Visit | Attending: Radiation Oncology | Admitting: Radiation Oncology

## 2022-01-21 ENCOUNTER — Other Ambulatory Visit: Payer: Self-pay

## 2022-01-21 ENCOUNTER — Inpatient Hospital Stay: Payer: BC Managed Care – PPO

## 2022-01-21 VITALS — BP 118/72 | HR 116 | Temp 97.9°F | Resp 20

## 2022-01-21 DIAGNOSIS — E860 Dehydration: Secondary | ICD-10-CM | POA: Insufficient documentation

## 2022-01-21 DIAGNOSIS — Z5189 Encounter for other specified aftercare: Secondary | ICD-10-CM | POA: Insufficient documentation

## 2022-01-21 DIAGNOSIS — C7801 Secondary malignant neoplasm of right lung: Secondary | ICD-10-CM | POA: Insufficient documentation

## 2022-01-21 DIAGNOSIS — R5383 Other fatigue: Secondary | ICD-10-CM | POA: Insufficient documentation

## 2022-01-21 DIAGNOSIS — R634 Abnormal weight loss: Secondary | ICD-10-CM | POA: Insufficient documentation

## 2022-01-21 DIAGNOSIS — C801 Malignant (primary) neoplasm, unspecified: Secondary | ICD-10-CM | POA: Insufficient documentation

## 2022-01-21 DIAGNOSIS — C7802 Secondary malignant neoplasm of left lung: Secondary | ICD-10-CM | POA: Insufficient documentation

## 2022-01-21 DIAGNOSIS — Z5111 Encounter for antineoplastic chemotherapy: Secondary | ICD-10-CM | POA: Insufficient documentation

## 2022-01-21 DIAGNOSIS — C7931 Secondary malignant neoplasm of brain: Secondary | ICD-10-CM | POA: Diagnosis present

## 2022-01-21 DIAGNOSIS — Z79899 Other long term (current) drug therapy: Secondary | ICD-10-CM | POA: Insufficient documentation

## 2022-01-21 DIAGNOSIS — Z923 Personal history of irradiation: Secondary | ICD-10-CM | POA: Insufficient documentation

## 2022-01-21 DIAGNOSIS — Z510 Encounter for antineoplastic radiation therapy: Secondary | ICD-10-CM | POA: Diagnosis present

## 2022-01-21 DIAGNOSIS — K219 Gastro-esophageal reflux disease without esophagitis: Secondary | ICD-10-CM | POA: Insufficient documentation

## 2022-01-21 DIAGNOSIS — R531 Weakness: Secondary | ICD-10-CM | POA: Insufficient documentation

## 2022-01-21 DIAGNOSIS — R031 Nonspecific low blood-pressure reading: Secondary | ICD-10-CM | POA: Diagnosis not present

## 2022-01-21 DIAGNOSIS — Z5112 Encounter for antineoplastic immunotherapy: Secondary | ICD-10-CM | POA: Diagnosis present

## 2022-01-21 DIAGNOSIS — Z7952 Long term (current) use of systemic steroids: Secondary | ICD-10-CM | POA: Diagnosis not present

## 2022-01-21 LAB — RAD ONC ARIA SESSION SUMMARY
Course Elapsed Days: 12
Plan Fractions Treated to Date: 4
Plan Fractions Treated to Date: 9
Plan Prescribed Dose Per Fraction: 2.5 Gy
Plan Prescribed Dose Per Fraction: 3 Gy
Plan Total Fractions Prescribed: 10
Plan Total Fractions Prescribed: 14
Plan Total Prescribed Dose: 30 Gy
Plan Total Prescribed Dose: 35 Gy
Reference Point Dosage Given to Date: 12 Gy
Reference Point Dosage Given to Date: 22.5 Gy
Reference Point Session Dosage Given: 2.5 Gy
Reference Point Session Dosage Given: 3 Gy
Session Number: 9

## 2022-01-21 MED ORDER — SODIUM CHLORIDE 0.9 % IV SOLN
100.0000 mg/m2 | Freq: Once | INTRAVENOUS | Status: AC
  Administered 2022-01-21: 180 mg via INTRAVENOUS
  Filled 2022-01-21: qty 9

## 2022-01-21 MED ORDER — SODIUM CHLORIDE 0.9% FLUSH
10.0000 mL | INTRAVENOUS | Status: DC | PRN
  Administered 2022-01-21: 10 mL

## 2022-01-21 MED ORDER — SODIUM CHLORIDE 0.9 % IV SOLN
10.0000 mg | Freq: Once | INTRAVENOUS | Status: AC
  Administered 2022-01-21: 10 mg via INTRAVENOUS
  Filled 2022-01-21: qty 10

## 2022-01-21 MED ORDER — HEPARIN SOD (PORK) LOCK FLUSH 100 UNIT/ML IV SOLN
250.0000 [IU] | Freq: Once | INTRAVENOUS | Status: AC | PRN
  Administered 2022-01-21: 250 [IU]

## 2022-01-21 MED ORDER — HEPARIN SOD (PORK) LOCK FLUSH 100 UNIT/ML IV SOLN: 500.0000 [IU] | Freq: Once | INTRAVENOUS | Status: AC | PRN

## 2022-01-21 MED ORDER — SODIUM CHLORIDE 0.9 % IV SOLN: Freq: Once | INTRAVENOUS | Status: AC

## 2022-01-21 MED FILL — Dexamethasone Sodium Phosphate Inj 100 MG/10ML: INTRAMUSCULAR | Qty: 1 | Status: AC

## 2022-01-21 NOTE — Progress Notes (Signed)
Per Dr. Alvy Bimler, [place] to proceed with elevated HR today. Pt states she is not in pain or dehydrated. Pt informed RN "I feel fine today, better than yesterday"

## 2022-01-21 NOTE — Patient Instructions (Signed)
[facility]  Discharge Instructions: Thank you for choosing [facility] to provide your oncology and hematology care.   If you have a lab appointment with the Cancer Center, please go directly to the Cancer Center and check in at the registration area.   Wear comfortable clothing and clothing appropriate for easy access to any Portacath or PICC line.   We strive to give you quality time with your provider. You may need to reschedule your appointment if you arrive late (15 or more minutes).  Arriving late affects you and other patients whose appointments are after yours.  Also, if you miss three or more appointments without notifying the office, you may be dismissed from the clinic at the provider's discretion.      For prescription refill requests, have your pharmacy contact our office and allow 72 hours for refills to be completed.    Today you received the following chemotherapy and/or immunotherapy agents: Etoposide      To help prevent nausea and vomiting after your treatment, we encourage you to take your nausea medication as directed.  BELOW ARE SYMPTOMS THAT SHOULD BE REPORTED IMMEDIATELY: *FEVER GREATER THAN 100.4 F (38 C) OR HIGHER *CHILLS OR SWEATING *NAUSEA AND VOMITING THAT IS NOT CONTROLLED WITH YOUR NAUSEA MEDICATION *UNUSUAL SHORTNESS OF BREATH *UNUSUAL BRUISING OR BLEEDING *URINARY PROBLEMS (pain or burning when urinating, or frequent urination) *BOWEL PROBLEMS (unusual diarrhea, constipation, pain near the anus) TENDERNESS IN MOUTH AND THROAT WITH OR WITHOUT PRESENCE OF ULCERS (sore throat, sores in mouth, or a toothache) UNUSUAL RASH, SWELLING OR PAIN  UNUSUAL VAGINAL DISCHARGE OR ITCHING   Items with * indicate a potential emergency and should be followed up as soon as possible or go to the Emergency Department if any problems should occur.  Please show the CHEMOTHERAPY ALERT CARD or IMMUNOTHERAPY ALERT CARD at check-in to  the Emergency Department and triage nurse.  Should you have questions after your visit or need to cancel or reschedule your appointment, please contact [facility]  Dept: [phone]  and follow the prompts.  Office hours are 8:00 a.m. to 4:30 p.m. Monday - Friday. Please note that voicemails left after 4:00 p.m. may not be returned until the following business day.  We are closed weekends and major holidays. You have access to a nurse at all times for urgent questions. Please call the main number to the clinic Dept: [phone] and follow the prompts.   For any non-urgent questions, you may also contact your provider using MyChart. We now offer e-Visits for anyone 18 and older to request care online for non-urgent symptoms. For details visit [email or web address].   Also download the MyChart app! Go to the app store, search "MyChart", open the app, select [facility], and log in with your MyChart username and password.  Masks are optional in the cancer centers. If you would like for your care team to wear a mask while they are taking care of you, please let them know. You may have one support person who is at least 40 years old accompany you for your appointments. 

## 2022-01-22 ENCOUNTER — Inpatient Hospital Stay: Payer: BC Managed Care – PPO

## 2022-01-22 ENCOUNTER — Other Ambulatory Visit: Payer: Self-pay

## 2022-01-22 ENCOUNTER — Other Ambulatory Visit: Payer: Self-pay | Admitting: Hematology and Oncology

## 2022-01-22 ENCOUNTER — Ambulatory Visit
Admission: RE | Admit: 2022-01-22 | Discharge: 2022-01-22 | Disposition: A | Discharge status: Home / Self Care | Payer: BC Managed Care – PPO | Source: Ambulatory Visit | Attending: Radiation Oncology | Admitting: Radiation Oncology

## 2022-01-22 VITALS — BP 108/85 | HR 124 | Temp 98.3°F | Resp 20

## 2022-01-22 DIAGNOSIS — Z5112 Encounter for antineoplastic immunotherapy: Secondary | ICD-10-CM | POA: Diagnosis not present

## 2022-01-22 DIAGNOSIS — C7801 Secondary malignant neoplasm of right lung: Secondary | ICD-10-CM

## 2022-01-22 DIAGNOSIS — C7800 Secondary malignant neoplasm of unspecified lung: Secondary | ICD-10-CM

## 2022-01-22 LAB — RAD ONC ARIA SESSION SUMMARY
Course Elapsed Days: 13
Plan Fractions Treated to Date: 10
Plan Fractions Treated to Date: 5
Plan Prescribed Dose Per Fraction: 2.5 Gy
Plan Prescribed Dose Per Fraction: 3 Gy
Plan Total Fractions Prescribed: 10
Plan Total Fractions Prescribed: 14
Plan Total Prescribed Dose: 30 Gy
Plan Total Prescribed Dose: 35 Gy
Reference Point Dosage Given to Date: 15 Gy
Reference Point Dosage Given to Date: 25 Gy
Reference Point Session Dosage Given: 2.5 Gy
Reference Point Session Dosage Given: 3 Gy
Session Number: 10

## 2022-01-22 MED ORDER — SODIUM CHLORIDE 0.9% FLUSH: 3.0000 mL | INTRAVENOUS | Status: DC | PRN

## 2022-01-22 MED ORDER — SODIUM CHLORIDE 0.9% FLUSH: 10.0000 mL | INTRAVENOUS | Status: DC | PRN

## 2022-01-22 MED ORDER — HEPARIN SOD (PORK) LOCK FLUSH 100 UNIT/ML IV SOLN
250.0000 [IU] | Freq: Once | INTRAVENOUS | Status: AC | PRN
  Administered 2022-01-22: 250 [IU]

## 2022-01-22 MED ORDER — SODIUM CHLORIDE 0.9 % IV SOLN
10.0000 mg | Freq: Once | INTRAVENOUS | Status: AC
  Administered 2022-01-22: 10 mg via INTRAVENOUS
  Filled 2022-01-22: qty 10

## 2022-01-22 MED ORDER — SODIUM CHLORIDE 0.9 % IV SOLN: Freq: Once | INTRAVENOUS | Status: AC

## 2022-01-22 MED ORDER — SODIUM CHLORIDE 0.9 % IV SOLN
100.0000 mg/m2 | Freq: Once | INTRAVENOUS | Status: AC
  Administered 2022-01-22: 180 mg via INTRAVENOUS
  Filled 2022-01-22: qty 9

## 2022-01-22 NOTE — Patient Instructions (Signed)
[facility] MEDICAL ONCOLOGY  Discharge Instructions: Thank you for choosing [facility] to provide your oncology and hematology care.   If you have a lab appointment with the Cancer Center, please go directly to the Cancer Center and check in at the registration area.   Wear comfortable clothing and clothing appropriate for easy access to any Portacath or PICC line.   We strive to give you quality time with your provider. You may need to reschedule your appointment if you arrive late (15 or more minutes).  Arriving late affects you and other patients whose appointments are after yours.  Also, if you miss three or more appointments without notifying the office, you may be dismissed from the clinic at the provider's discretion.      For prescription refill requests, have your pharmacy contact our office and allow 72 hours for refills to be completed.    Today you received the following chemotherapy and/or immunotherapy agents: Etoposide      To help prevent nausea and vomiting after your treatment, we encourage you to take your nausea medication as directed.  BELOW ARE SYMPTOMS THAT SHOULD BE REPORTED IMMEDIATELY: *FEVER GREATER THAN 100.4 F (38 C) OR HIGHER *CHILLS OR SWEATING *NAUSEA AND VOMITING THAT IS NOT CONTROLLED WITH YOUR NAUSEA MEDICATION *UNUSUAL SHORTNESS OF BREATH *UNUSUAL BRUISING OR BLEEDING *URINARY PROBLEMS (pain or burning when urinating, or frequent urination) *BOWEL PROBLEMS (unusual diarrhea, constipation, pain near the anus) TENDERNESS IN MOUTH AND THROAT WITH OR WITHOUT PRESENCE OF ULCERS (sore throat, sores in mouth, or a toothache) UNUSUAL RASH, SWELLING OR PAIN  UNUSUAL VAGINAL DISCHARGE OR ITCHING   Items with * indicate a potential emergency and should be followed up as soon as possible or go to the Emergency Department if any problems should occur.  Please show the CHEMOTHERAPY ALERT CARD or IMMUNOTHERAPY ALERT CARD at check-in to  the Emergency Department and triage nurse.  Should you have questions after your visit or need to cancel or reschedule your appointment, please contact [facility]  Dept: [phone]  and follow the prompts.  Office hours are 8:00 a.m. to 4:30 p.m. Monday - Friday. Please note that voicemails left after 4:00 p.m. may not be returned until the following business day.  We are closed weekends and major holidays. You have access to a nurse at all times for urgent questions. Please call the main number to the clinic Dept: [phone] and follow the prompts.   For any non-urgent questions, you may also contact your provider using MyChart. We now offer e-Visits for anyone 18 and older to request care online for non-urgent symptoms. For details visit [email or web address].   Also download the MyChart app! Go to the app store, search "MyChart", open the app, select [facility], and log in with your MyChart username and password.  Masks are optional in the cancer centers. If you would like for your care team to wear a mask while they are taking care of you, please let them know. You may have one support person who is at least 40 years old accompany you for your appointments. 

## 2022-01-23 ENCOUNTER — Ambulatory Visit
Admission: RE | Admit: 2022-01-23 | Discharge: 2022-01-23 | Disposition: A | Discharge status: Home / Self Care | Payer: BC Managed Care – PPO | Source: Ambulatory Visit | Attending: Radiation Oncology | Admitting: Radiation Oncology

## 2022-01-23 ENCOUNTER — Other Ambulatory Visit: Payer: Self-pay

## 2022-01-23 DIAGNOSIS — Z5112 Encounter for antineoplastic immunotherapy: Secondary | ICD-10-CM | POA: Diagnosis not present

## 2022-01-23 LAB — RAD ONC ARIA SESSION SUMMARY
Course Elapsed Days: 14
Plan Fractions Treated to Date: 11
Plan Fractions Treated to Date: 6
Plan Prescribed Dose Per Fraction: 2.5 Gy
Plan Prescribed Dose Per Fraction: 3 Gy
Plan Total Fractions Prescribed: 10
Plan Total Fractions Prescribed: 14
Plan Total Prescribed Dose: 30 Gy
Plan Total Prescribed Dose: 35 Gy
Reference Point Dosage Given to Date: 18 Gy
Reference Point Dosage Given to Date: 27.5 Gy
Reference Point Session Dosage Given: 2.5 Gy
Reference Point Session Dosage Given: 3 Gy
Session Number: 11

## 2022-01-24 ENCOUNTER — Ambulatory Visit
Admission: RE | Admit: 2022-01-24 | Discharge: 2022-01-24 | Disposition: A | Discharge status: Home / Self Care | Payer: BC Managed Care – PPO | Source: Ambulatory Visit | Attending: Radiation Oncology | Admitting: Radiation Oncology

## 2022-01-24 ENCOUNTER — Encounter: Payer: Self-pay | Admitting: Hematology and Oncology

## 2022-01-24 ENCOUNTER — Other Ambulatory Visit: Payer: Self-pay

## 2022-01-24 ENCOUNTER — Other Ambulatory Visit: Payer: Self-pay | Admitting: Radiation Oncology

## 2022-01-24 ENCOUNTER — Other Ambulatory Visit (HOSPITAL_COMMUNITY): Payer: Self-pay

## 2022-01-24 ENCOUNTER — Inpatient Hospital Stay: Payer: BC Managed Care – PPO

## 2022-01-24 VITALS — BP 106/80 | HR 120 | Temp 98.5°F | Resp 20

## 2022-01-24 DIAGNOSIS — C7800 Secondary malignant neoplasm of unspecified lung: Secondary | ICD-10-CM

## 2022-01-24 DIAGNOSIS — C7801 Secondary malignant neoplasm of right lung: Secondary | ICD-10-CM

## 2022-01-24 DIAGNOSIS — Z5112 Encounter for antineoplastic immunotherapy: Secondary | ICD-10-CM | POA: Diagnosis not present

## 2022-01-24 DIAGNOSIS — C801 Malignant (primary) neoplasm, unspecified: Secondary | ICD-10-CM

## 2022-01-24 LAB — RAD ONC ARIA SESSION SUMMARY
Course Elapsed Days: 15
Plan Fractions Treated to Date: 12
Plan Fractions Treated to Date: 7
Plan Prescribed Dose Per Fraction: 2.5 Gy
Plan Prescribed Dose Per Fraction: 3 Gy
Plan Total Fractions Prescribed: 10
Plan Total Fractions Prescribed: 14
Plan Total Prescribed Dose: 30 Gy
Plan Total Prescribed Dose: 35 Gy
Reference Point Dosage Given to Date: 21 Gy
Reference Point Dosage Given to Date: 30 Gy
Reference Point Session Dosage Given: 2.5 Gy
Reference Point Session Dosage Given: 3 Gy
Session Number: 12

## 2022-01-24 MED ORDER — HEPARIN SOD (PORK) LOCK FLUSH 100 UNIT/ML IV SOLN
500.0000 [IU] | Freq: Once | INTRAVENOUS | Status: AC
  Administered 2022-01-24: 250 [IU]

## 2022-01-24 MED ORDER — PEGFILGRASTIM-CBQV 6 MG/0.6ML ~~LOC~~ SOSY
6.0000 mg | PREFILLED_SYRINGE | Freq: Once | SUBCUTANEOUS | Status: AC
  Administered 2022-01-24: 6 mg via SUBCUTANEOUS
  Filled 2022-01-24: qty 0.6

## 2022-01-24 MED ORDER — SUCRALFATE 1 G PO TABS
ORAL_TABLET | ORAL | 3 refills | Status: DC
  Filled 2022-01-24: qty 30, 7d supply, fill #0

## 2022-01-24 MED ORDER — SODIUM CHLORIDE 0.9% FLUSH
10.0000 mL | Freq: Once | INTRAVENOUS | Status: AC
  Administered 2022-01-24: 10 mL

## 2022-01-24 MED ORDER — LIDOCAINE VISCOUS HCL 2 % MT SOLN
OROMUCOSAL | 4 refills | Status: AC
  Filled 2022-01-24: qty 150, 3d supply, fill #0

## 2022-01-24 MED ORDER — DEXAMETHASONE 4 MG PO TABS: ORAL_TABLET | ORAL | 1 refills | Status: AC

## 2022-01-24 NOTE — Patient Instructions (Signed)

## 2022-01-27 ENCOUNTER — Telehealth: Payer: Self-pay | Admitting: Genetic Counselor

## 2022-01-27 ENCOUNTER — Other Ambulatory Visit: Payer: Self-pay

## 2022-01-27 ENCOUNTER — Ambulatory Visit
Admission: RE | Admit: 2022-01-27 | Discharge: 2022-01-27 | Disposition: A | Discharge status: Home / Self Care | Payer: BC Managed Care – PPO | Source: Ambulatory Visit | Attending: Radiation Oncology | Admitting: Radiation Oncology

## 2022-01-27 ENCOUNTER — Encounter: Payer: Self-pay | Admitting: Genetic Counselor

## 2022-01-27 ENCOUNTER — Encounter: Payer: Self-pay | Admitting: General Practice

## 2022-01-27 DIAGNOSIS — Z5112 Encounter for antineoplastic immunotherapy: Secondary | ICD-10-CM | POA: Diagnosis not present

## 2022-01-27 DIAGNOSIS — Z1379 Encounter for other screening for genetic and chromosomal anomalies: Secondary | ICD-10-CM | POA: Insufficient documentation

## 2022-01-27 LAB — RAD ONC ARIA SESSION SUMMARY
Course Elapsed Days: 18
Plan Fractions Treated to Date: 13
Plan Fractions Treated to Date: 8
Plan Prescribed Dose Per Fraction: 2.5 Gy
Plan Prescribed Dose Per Fraction: 3 Gy
Plan Total Fractions Prescribed: 10
Plan Total Fractions Prescribed: 14
Plan Total Prescribed Dose: 30 Gy
Plan Total Prescribed Dose: 35 Gy
Reference Point Dosage Given to Date: 24 Gy
Reference Point Dosage Given to Date: 32.5 Gy
Reference Point Session Dosage Given: 2.5 Gy
Reference Point Session Dosage Given: 3 Gy
Session Number: 13

## 2022-01-27 NOTE — Telephone Encounter (Signed)
LM on VM that results are back and to please call.  Left CB instructions. 

## 2022-01-27 NOTE — Progress Notes (Signed)
[facility] Spiritual Care Note  Left voicemail of care and encouragement, including direct number. Will try again later in week if needed.   [place], [place], [facility] Pager [phone] Voicemail [phone]

## 2022-01-28 ENCOUNTER — Other Ambulatory Visit: Payer: Self-pay

## 2022-01-28 ENCOUNTER — Telehealth: Payer: Self-pay | Admitting: Genetic Counselor

## 2022-01-28 ENCOUNTER — Inpatient Hospital Stay (HOSPITAL_BASED_OUTPATIENT_CLINIC_OR_DEPARTMENT_OTHER): Payer: BC Managed Care – PPO | Admitting: Hematology and Oncology

## 2022-01-28 ENCOUNTER — Ambulatory Visit
Admission: RE | Admit: 2022-01-28 | Discharge: 2022-01-28 | Disposition: A | Discharge status: Home / Self Care | Payer: BC Managed Care – PPO | Source: Ambulatory Visit | Attending: Radiation Oncology | Admitting: Radiation Oncology

## 2022-01-28 ENCOUNTER — Other Ambulatory Visit (HOSPITAL_COMMUNITY): Payer: Self-pay

## 2022-01-28 ENCOUNTER — Ambulatory Visit: Payer: BC Managed Care – PPO

## 2022-01-28 DIAGNOSIS — R634 Abnormal weight loss: Secondary | ICD-10-CM

## 2022-01-28 DIAGNOSIS — Z7189 Other specified counseling: Secondary | ICD-10-CM | POA: Diagnosis not present

## 2022-01-28 DIAGNOSIS — Z5112 Encounter for antineoplastic immunotherapy: Secondary | ICD-10-CM | POA: Diagnosis not present

## 2022-01-28 DIAGNOSIS — C7801 Secondary malignant neoplasm of right lung: Secondary | ICD-10-CM | POA: Diagnosis not present

## 2022-01-28 DIAGNOSIS — K2100 Gastro-esophageal reflux disease with esophagitis, without bleeding: Secondary | ICD-10-CM

## 2022-01-28 DIAGNOSIS — C7802 Secondary malignant neoplasm of left lung: Secondary | ICD-10-CM

## 2022-01-28 LAB — RAD ONC ARIA SESSION SUMMARY
Course Elapsed Days: 19
Plan Fractions Treated to Date: 14
Plan Fractions Treated to Date: 9
Plan Prescribed Dose Per Fraction: 2.5 Gy
Plan Prescribed Dose Per Fraction: 3 Gy
Plan Total Fractions Prescribed: 10
Plan Total Fractions Prescribed: 14
Plan Total Prescribed Dose: 30 Gy
Plan Total Prescribed Dose: 35 Gy
Reference Point Dosage Given to Date: 27 Gy
Reference Point Dosage Given to Date: 35 Gy
Reference Point Session Dosage Given: 2.5 Gy
Reference Point Session Dosage Given: 3 Gy
Session Number: 14

## 2022-01-28 MED ORDER — PANTOPRAZOLE SODIUM 40 MG PO TBEC
40.0000 mg | DELAYED_RELEASE_TABLET | Freq: Two times a day (BID) | ORAL | 1 refills | Status: AC
  Filled 2022-01-28: qty 60, 30d supply, fill #0

## 2022-01-28 NOTE — Telephone Encounter (Signed)
Second attempt: LM on VM that results are back and to please call.  Left CB instructions.

## 2022-01-29 ENCOUNTER — Ambulatory Visit
Admission: RE | Admit: 2022-01-29 | Discharge: 2022-01-29 | Disposition: A | Discharge status: Home / Self Care | Payer: BC Managed Care – PPO | Source: Ambulatory Visit | Attending: Radiation Oncology | Admitting: Radiation Oncology

## 2022-01-29 ENCOUNTER — Encounter: Payer: Self-pay | Admitting: Genetic Counselor

## 2022-01-29 ENCOUNTER — Encounter: Payer: Self-pay | Admitting: Hematology and Oncology

## 2022-01-29 ENCOUNTER — Encounter: Payer: Self-pay | Admitting: Radiation Oncology

## 2022-01-29 ENCOUNTER — Other Ambulatory Visit: Payer: Self-pay

## 2022-01-29 DIAGNOSIS — K219 Gastro-esophageal reflux disease without esophagitis: Secondary | ICD-10-CM | POA: Insufficient documentation

## 2022-01-29 DIAGNOSIS — R634 Abnormal weight loss: Secondary | ICD-10-CM | POA: Insufficient documentation

## 2022-01-29 DIAGNOSIS — Z5112 Encounter for antineoplastic immunotherapy: Secondary | ICD-10-CM | POA: Diagnosis not present

## 2022-01-29 LAB — RAD ONC ARIA SESSION SUMMARY
Course Elapsed Days: 20
Plan Fractions Treated to Date: 10
Plan Prescribed Dose Per Fraction: 3 Gy
Plan Total Fractions Prescribed: 10
Plan Total Prescribed Dose: 30 Gy
Reference Point Dosage Given to Date: 30 Gy
Reference Point Session Dosage Given: 3 Gy
Session Number: 15

## 2022-01-29 NOTE — Assessment & Plan Note (Signed)
She is feeling better from a respiratory standpoint due to beneficial therapeutic effect from radiation However, her recent weight loss and severe heartburn could be exacerbated by gastric reflux and her steroid treatment We discussed switching medications and aggressive supportive care I will see her again next week for further follow-up

## 2022-01-29 NOTE — Assessment & Plan Note (Signed)
We have extensive discussions about goals of care today

## 2022-01-29 NOTE — Assessment & Plan Note (Signed)
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

## 2022-01-29 NOTE — Assessment & Plan Note (Signed)
She has symptoms of severe reflux I recommend discontinuation of ibuprofen and recommend high-dose proton pump inhibitor, Pepcid, Maalox and Tums as needed

## 2022-01-29 NOTE — Progress Notes (Signed)
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 [facility]) She is feeling better from a respiratory standpoint due to beneficial therapeutic effect from radiation However, her recent weight loss and severe heartburn could be exacerbated by gastric reflux and her steroid treatment We discussed switching medications and aggressive supportive care I will see her again next week for further follow-up  GERD (gastroesophageal reflux disease) She has symptoms of severe reflux I recommend discontinuation of ibuprofen and recommend high-dose proton pump inhibitor, Pepcid, Maalox and Tums as needed  Weight loss She has evidence of weight loss and loss of muscle mass We discussed importance of frequent small meals and aggressive treatment for her reflux disease When she feels better, we will refer her to get outpatient physical therapy and 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   All questions were answered. The patient knows to call the clinic with any problems, questions or concerns. The total time spent in the appointment was 30 minutes encounter with patients including review of chart and various tests results, discussions about plan of care and coordination of care plan   Heath Lark, MD 01/29/2022 1:39 PM  INTERVAL HISTORY: Please see below for problem oriented charting. she returns for treatment follow-up with her husband She had significant symptoms of reflux disease and difficulties with eating She has  lost some weight She is hydrating herself as much as possible She complains of generalized weakness and fatigue due to side effects of radiation Her breathing is much improved The patient is somewhat tearful, wants to discuss and reviewed plan of care and discussed prognosis and long-term plan  REVIEW OF SYSTEMS:   Constitutional: Denies fevers, chills  Eyes: Denies blurriness of vision Ears, nose, mouth, throat, and face: Denies mucositis or sore throat Respiratory: Denies cough, dyspnea or wheezes Cardiovascular: Denies palpitation, chest discomfort or lower extremity swelling Skin: Denies abnormal skin rashes Lymphatics: Denies new lymphadenopathy or easy bruising Behavioral/Psych: Mood is stable, no new changes  All other systems were reviewed with the patient and are negative.  I have reviewed the past medical history, past surgical history, social history and family history with the patient and they are unchanged from previous note.  ALLERGIES:  is allergic to oxycodone.  MEDICATIONS:  Current Outpatient Medications  Medication Sig Dispense Refill   famotidine (PEPCID) 20 MG tablet Take 20 mg by mouth 2 (two) times daily.     pantoprazole (PROTONIX) 40 MG tablet Take 1 tablet (40 mg total) by mouth 2 (two) times daily. 60 tablet 1   acetaminophen (TYLENOL) 500 MG tablet Take 1,000 mg by mouth every 6 (six) hours as needed for moderate pain or headache.     albuterol (VENTOLIN HFA) 108 (90 Base) MCG/ACT inhaler Inhale 2 puffs into the lungs every 4 (four) hours as needed for wheezing or shortness of breath. 18 g 0   allopurinol (ZYLOPRIM) 300 MG tablet TAKE 1 TABLET BY MOUTH EVERY DAY 90 tablet 1   buPROPion (WELLBUTRIN XL) 300 MG 24 hr tablet Take 300 mg by mouth daily.  citalopram (CELEXA) 40 MG tablet Take 40 mg by mouth daily.      dexamethasone (DECADRON) 4 MG tablet Take 55m BID. Instructed to taper in late August to 467mdaily as symptoms allow. 90 tablet 1    dextromethorphan-guaiFENesin (MUCINEX DM) 30-600 MG 12hr tablet Take 1 tablet by mouth 2 (two) times daily. 30 tablet 0   levalbuterol (XOPENEX) 0.63 MG/3ML nebulizer solution Take 3 mLs (0.63 mg total) by nebulization every 4 (four) hours as needed for wheezing or shortness of breath. 3 mL 12   lidocaine (XYLOCAINE) 2 % solution Mix 1 part 2% viscous lidocaine with 1 part water. Swallow 1035mf mixture, 30 minutes before meals and at bedtime, up to 4 times a day. 150 mL 4   LORazepam (ATIVAN) 1 MG tablet Take 1 tablet (1 mg total) by mouth every 6 (six) hours as needed for anxiety. 60 tablet 0   Magnesium 300 MG CAPS Take 300 mg by mouth every evening.     morphine (MSIR) 15 MG tablet Take 1 tablet (15 mg total) by mouth every 4 (four) hours as needed for severe pain. 60 tablet 0   Multiple Vitamins-Minerals (MULTIVITAMIN WITH MINERALS) tablet Take 1 tablet by mouth daily.     ondansetron (ZOFRAN) 8 MG tablet Take 1 tablet (8 mg total) by mouth every 8 (eight) hours as needed for nausea. 30 tablet 3   prochlorperazine (COMPAZINE) 10 MG tablet Take 1 tablet (10 mg total) by mouth every 6 (six) hours as needed for nausea or vomiting. 30 tablet 0   Sodium Chloride Flush (NORMAL SALINE FLUSH) 0.9 % SOLN Inject 10 mLs into the vein daily. 300 mL 3   Sodium Chloride Flush (NORMAL SALINE FLUSH) 0.9 % SOLN Please flush 10 ml normal saline daily 300 mL 3   traZODone (DESYREL) 50 MG tablet Take 1 tablet (50 mg total) by mouth at bedtime. 30 tablet 0   No current facility-administered medications for this visit.    SUMMARY OF ONCOLOGIC HISTORY: Oncology History  [place] lungs [place] [facility] [facility]   1. Negative for acute PE or thoracic aortic dissection. 2. Encasement and narrowing of the right upper lobe pulmonary artery by tumor. 3. Stable bilateral pulmonary nodules, hilar and mediastinal adenopathy. 4. Progressive consolidation at the right lung base   12/04/2021  Imaging   1. A mixed solid and cystic mass replaces the left ovary measuring roughly 6.8 x 11.0 x 9.2 cm in greatest dimension. This may represent a primary ovarian mass or an ovarian metastasis. 2. Multiple pathologically enlarged lymph nodes within the gastrohepatic ligament, consistent with metastatic disease. 3. Innumerable pulmonary masses within the visualized lung bases bilaterally, better evaluated on previously performed dedicated CT imaging of the chest. 4. Small left pleural effusion. 5. Cholelithiasis without pericholecystic inflammatory change.     12/05/2021 Pathology Results   LUNG, RIGHT LOWER LOBE, NEEDLE CORE BIOPSY:  Non-small cell carcinoma, poorly differentiated with necrosis (see comment)   COMMENT:   Sections show multiple cores of pulmonary parenchyma multifocally infiltrated by sheets and nests of large, atypical cells with variably large round to oval nuclei and scattered small inconspicuous nucleoli.  There are foci of geographic tumor necrosis.  In areas focally the tumor shows features suggestive of a squamous differentiation. Sixteen immunohistochemical stains are performed with adequate control.  The  tumor is positive for pancytokeratin (AE1/AE3).  The tumor also shows patchy positivity for high molecular weight cytokeratin 5/6.  The tumor  is negative for cytokeratin 7 and cytokeratin 20.  The tumor is negative for the squamous markers p40 and p63.  The tumor is negative for the pulmonary adeno markers Napsin A and TTF-1.  The tumor is negative for the GYN and renal marker PAX8.  The tumor is negative for the GYN and mesothelial markers WT1 the tumor shows patchy positivity for the neuroendocrine markers synaptophysin and CD56 the tumor also shows variable positivity for p53.  The tumor shows variable strong positivity for p16.  The tumor is negative for estrogen receptor and progesterone receptor.   In summary the cores show a poorly differentiated non-small cell  carcinoma with necrosis exhibiting focal neuroendocrine differentiation by immunohistochemistry.  This immunohistochemical pattern does not  suggest a primary for this tumor.  The cytokeratin 5/6 positivity, although commonly used for squamous differentiation within pulmonary tumors, may be seen in high-grade ovarian tumors particularly serous  carcinomas.  Clinically the patient is reported to have a large ovarian mass with multiple pulmonary nodules and extensive abdominal and mediastinal adenopathy.  Given this history, the p16 and p53 positivity  may be seen in ovarian tumors, but they may also be seen in tumors from other sites.  Also the tumor is negative for the more specific gynecologic markers cytokeratin 7, PAX8 and WT1.  Additional clinical  and radiologic correlation is recommended and an ovarian mass biopsy is suggested.  With additional tissue molecular testing would be possible if needed.    12/05/2021 Procedure   Successful CT-guided core needle biopsy of right lower lung pulmonary nodule/mass.    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 Potential mixed response to interval therapy. The mediastinal adenopathy and extensive pulmonary nodularity demonstrated previously have improved in the interval. 2. There are multiple newly visible low-density hepatic lesions which are suspicious for metastatic disease. These may be visible due to interval treatment or reflect new lesions. 3. The complex solid and cystic adnexal mass has mildly enlarged from the prior study (primarily the cystic right-sided component). 4. Upper abdominal adenopathy has not significantly changed. 5. No acute findings  identified.     01/03/2022 Imaging   Innumerable enhancing lesions scattered throughout the brain parenchyma consistent with metastatic disease to the brain   01/14/2022 Imaging   Stable findings of pulmonary metastatic disease and mediastinal lymphadenopathy. No new acute findings   01/14/2022 Imaging   1. No evidence of significant pulmonary embolus. 2. Extensive malignancy demonstrated throughout the chest. Prominent lymphadenopathy throughout the mediastinum, hilar, and peribronchovascular regions. Right hilar/suprahilar mass lesion. 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 3. Lymphadenopathy causes new extrinsic compression of the SVC resulting in SVC obstruction. 4. Metastatic lymphadenopathy in the celiac axis of the upper abdomen and probably in the liver. 5. Extrinsic compression of right middle lobe and right upper lobe bronchi with postobstructive changes.   01/21/2022 Genetic Testing   Negative genetic testing on the CancerNext-Expanded+RNAinsight panel.  The report date is January 21, 2022.  The CancerNext-Expanded gene panel offered by [facility] and includes sequencing and rearrangement analysis for the following 77 genes: AIP, ALK, APC*, ATM*, AXIN2, BAP1, BARD1, BLM, BMPR1A, BRCA1*,  BRCA2*, BRIP1*, CDC73, CDH1*, CDK4, CDKN1B, CDKN2A, CHEK2*, CTNNA1, DICER1, FANCC, FH, FLCN, GALNT12, KIF1B, LZTR1, MAX, MEN1, MET, MLH1*, MSH2*, MSH3, MSH6*, MUTYH*, NBN, NF1*, NF2, NTHL1, PALB2*, PHOX2B, PMS2*, POT1, PRKAR1A, PTCH1, PTEN*, RAD51C*, RAD51D*, RB1, RECQL, RET, SDHA, SDHAF2, SDHB, SDHC, SDHD, SMAD4, SMARCA4, SMARCB1, SMARCE1, STK11, SUFU, TMEM127, TP53*, TSC1, TSC2, VHL and XRCC2 (sequencing and deletion/duplication); EGFR, EGLN1, HOXB13, KIT, MITF, PDGFRA, POLD1, and POLE (sequencing only); EPCAM and GREM1 (deletion/duplication only). DNA and RNA analyses performed for * genes.    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 [facility])   12/07/2021 - 12/07/2021 Chemotherapy   Patient is on Treatment Plan : OVARIAN Carboplatin (AUC 6) / Paclitaxel (175) q21d x 6 cycles     12/30/2021 -  Chemotherapy   Patient is on Treatment Plan : LUNG SCLC Carboplatin + Etoposide + Atezolizumab Induction q21d / Atezolizumab Maintenance q21d       PHYSICAL EXAMINATION: ECOG PERFORMANCE STATUS: 2 - Symptomatic, <50% confined to bed  Vitals:   01/28/22 1541  BP: 118/69  Pulse: (!) 120  Resp: 18  Temp: 98 F (36.7 C)  SpO2: 98%   Filed Weights   01/28/22 1541  Weight: 140 lb 9.6 oz (63.8 kg)    GENERAL:alert, no distress and comfortable NEURO: alert & oriented x 3 with fluent speech, no focal motor/sensory deficits.  Noted evidence of loss of muscle mass  LABORATORY DATA:  I have reviewed the data as listed    Component Value Date/Time   NA 132 (L) 01/14/2022 0814   K 3.6 01/14/2022 0814   CL 101 01/14/2022 0814   CO2 20 (L) 01/14/2022 0814   GLUCOSE 172 (H) 01/14/2022 0814   BUN 28 (H) 01/14/2022 0814   CREATININE 0.73 01/14/2022 0814   CALCIUM 9.3 01/14/2022 0814   PROT 7.0 01/14/2022 0814   ALBUMIN 3.9 01/14/2022 0814   AST 46 (H) 01/14/2022 0814   ALT 23 01/14/2022 0814   ALKPHOS 170 (H) 01/14/2022 0814   BILITOT 0.3 01/14/2022 0814   GFRNONAA >60 01/14/2022 0814    No results found for: "SPEP", "UPEP"  Lab Results  Component Value Date   WBC 19.1 (H) 01/14/2022   NEUTROABS 12.9 (H) 01/14/2022   HGB 12.2 01/14/2022   HCT 34.8 (L) 01/14/2022   MCV 87.9 01/14/2022   PLT 202 01/14/2022      Chemistry      Component Value Date/Time   NA 132 (L) 01/14/2022 0814   K 3.6 01/14/2022 0814   CL 101 01/14/2022 0814   CO2 20 (L) 01/14/2022 0814   BUN 28 (H) 01/14/2022 0814   CREATININE 0.73 01/14/2022 0814      Component Value Date/Time   CALCIUM 9.3 01/14/2022 0814   ALKPHOS 170 (H)  01/14/2022 0814   AST 46 (H) 01/14/2022 0814   ALT 23 01/14/2022 0814   BILITOT 0.3 01/14/2022 9323

## 2022-01-30 ENCOUNTER — Encounter: Payer: Self-pay | Admitting: Hematology and Oncology

## 2022-01-30 NOTE — Telephone Encounter (Signed)
BRCAPlus testing was negative.  We are still waiting on the remainder of testing to be back.  This should be back no later than August 17.

## 2022-01-31 ENCOUNTER — Other Ambulatory Visit: Payer: Self-pay

## 2022-01-31 ENCOUNTER — Inpatient Hospital Stay: Payer: BC Managed Care – PPO

## 2022-01-31 DIAGNOSIS — C7801 Secondary malignant neoplasm of right lung: Secondary | ICD-10-CM

## 2022-01-31 DIAGNOSIS — R0602 Shortness of breath: Secondary | ICD-10-CM | POA: Diagnosis not present

## 2022-01-31 DIAGNOSIS — C7802 Secondary malignant neoplasm of left lung: Secondary | ICD-10-CM

## 2022-01-31 DIAGNOSIS — A419 Sepsis, unspecified organism: Secondary | ICD-10-CM | POA: Diagnosis not present

## 2022-01-31 MED ORDER — HEPARIN SOD (PORK) LOCK FLUSH 100 UNIT/ML IV SOLN
250.0000 [IU] | Freq: Once | INTRAVENOUS | Status: AC
  Administered 2022-01-31: 250 [IU]

## 2022-01-31 MED ORDER — SODIUM CHLORIDE 0.9% FLUSH
10.0000 mL | Freq: Once | INTRAVENOUS | Status: AC
  Administered 2022-01-31: 10 mL

## 2022-02-03 ENCOUNTER — Telehealth: Payer: Self-pay

## 2022-02-03 ENCOUNTER — Emergency Department (HOSPITAL_COMMUNITY): Payer: BC Managed Care – PPO

## 2022-02-03 ENCOUNTER — Inpatient Hospital Stay (HOSPITAL_COMMUNITY)
Admission: EM | Admit: 2022-02-03 | Discharge: 2022-02-21 | DRG: 871 | Disposition: E | Payer: BC Managed Care – PPO | Attending: Student | Admitting: Student

## 2022-02-03 ENCOUNTER — Other Ambulatory Visit: Payer: Self-pay

## 2022-02-03 ENCOUNTER — Encounter (HOSPITAL_COMMUNITY): Payer: Self-pay

## 2022-02-03 ENCOUNTER — Inpatient Hospital Stay (HOSPITAL_COMMUNITY): Payer: BC Managed Care – PPO

## 2022-02-03 DIAGNOSIS — J189 Pneumonia, unspecified organism: Secondary | ICD-10-CM | POA: Diagnosis present

## 2022-02-03 DIAGNOSIS — J9601 Acute respiratory failure with hypoxia: Secondary | ICD-10-CM | POA: Diagnosis not present

## 2022-02-03 DIAGNOSIS — I959 Hypotension, unspecified: Secondary | ICD-10-CM | POA: Diagnosis not present

## 2022-02-03 DIAGNOSIS — J9622 Acute and chronic respiratory failure with hypercapnia: Secondary | ICD-10-CM | POA: Diagnosis present

## 2022-02-03 DIAGNOSIS — R6520 Severe sepsis without septic shock: Secondary | ICD-10-CM | POA: Diagnosis not present

## 2022-02-03 DIAGNOSIS — C7801 Secondary malignant neoplasm of right lung: Secondary | ICD-10-CM

## 2022-02-03 DIAGNOSIS — D84821 Immunodeficiency due to drugs: Secondary | ICD-10-CM | POA: Diagnosis present

## 2022-02-03 DIAGNOSIS — Z808 Family history of malignant neoplasm of other organs or systems: Secondary | ICD-10-CM

## 2022-02-03 DIAGNOSIS — D61818 Other pancytopenia: Secondary | ICD-10-CM | POA: Diagnosis present

## 2022-02-03 DIAGNOSIS — C7800 Secondary malignant neoplasm of unspecified lung: Secondary | ICD-10-CM

## 2022-02-03 DIAGNOSIS — Z800 Family history of malignant neoplasm of digestive organs: Secondary | ICD-10-CM

## 2022-02-03 DIAGNOSIS — R0602 Shortness of breath: Secondary | ICD-10-CM | POA: Diagnosis present

## 2022-02-03 DIAGNOSIS — Z20822 Contact with and (suspected) exposure to covid-19: Secondary | ICD-10-CM | POA: Diagnosis present

## 2022-02-03 DIAGNOSIS — Z515 Encounter for palliative care: Secondary | ICD-10-CM

## 2022-02-03 DIAGNOSIS — F419 Anxiety disorder, unspecified: Secondary | ICD-10-CM | POA: Diagnosis present

## 2022-02-03 DIAGNOSIS — D61810 Antineoplastic chemotherapy induced pancytopenia: Secondary | ICD-10-CM | POA: Diagnosis present

## 2022-02-03 DIAGNOSIS — Z66 Do not resuscitate: Secondary | ICD-10-CM | POA: Diagnosis present

## 2022-02-03 DIAGNOSIS — Z7952 Long term (current) use of systemic steroids: Secondary | ICD-10-CM

## 2022-02-03 DIAGNOSIS — Z8249 Family history of ischemic heart disease and other diseases of the circulatory system: Secondary | ICD-10-CM

## 2022-02-03 DIAGNOSIS — C801 Malignant (primary) neoplasm, unspecified: Secondary | ICD-10-CM | POA: Diagnosis present

## 2022-02-03 DIAGNOSIS — C772 Secondary and unspecified malignant neoplasm of intra-abdominal lymph nodes: Secondary | ICD-10-CM | POA: Diagnosis present

## 2022-02-03 DIAGNOSIS — A419 Sepsis, unspecified organism: Secondary | ICD-10-CM | POA: Diagnosis present

## 2022-02-03 DIAGNOSIS — Z95828 Presence of other vascular implants and grafts: Secondary | ICD-10-CM | POA: Diagnosis not present

## 2022-02-03 DIAGNOSIS — C7931 Secondary malignant neoplasm of brain: Secondary | ICD-10-CM | POA: Diagnosis present

## 2022-02-03 DIAGNOSIS — T451X5A Adverse effect of antineoplastic and immunosuppressive drugs, initial encounter: Secondary | ICD-10-CM | POA: Diagnosis present

## 2022-02-03 DIAGNOSIS — R000 Tachycardia, unspecified: Secondary | ICD-10-CM | POA: Diagnosis not present

## 2022-02-03 DIAGNOSIS — E8729 Other acidosis: Secondary | ICD-10-CM | POA: Diagnosis not present

## 2022-02-03 DIAGNOSIS — F32A Depression, unspecified: Secondary | ICD-10-CM | POA: Diagnosis present

## 2022-02-03 DIAGNOSIS — Z803 Family history of malignant neoplasm of breast: Secondary | ICD-10-CM

## 2022-02-03 DIAGNOSIS — C787 Secondary malignant neoplasm of liver and intrahepatic bile duct: Secondary | ICD-10-CM | POA: Diagnosis present

## 2022-02-03 DIAGNOSIS — R109 Unspecified abdominal pain: Secondary | ICD-10-CM | POA: Diagnosis present

## 2022-02-03 DIAGNOSIS — R040 Epistaxis: Secondary | ICD-10-CM | POA: Diagnosis not present

## 2022-02-03 DIAGNOSIS — I3139 Other pericardial effusion (noninflammatory): Secondary | ICD-10-CM | POA: Diagnosis present

## 2022-02-03 DIAGNOSIS — R1012 Left upper quadrant pain: Secondary | ICD-10-CM

## 2022-02-03 DIAGNOSIS — Z885 Allergy status to narcotic agent status: Secondary | ICD-10-CM

## 2022-02-03 DIAGNOSIS — R739 Hyperglycemia, unspecified: Secondary | ICD-10-CM | POA: Diagnosis present

## 2022-02-03 DIAGNOSIS — Z9981 Dependence on supplemental oxygen: Secondary | ICD-10-CM | POA: Diagnosis not present

## 2022-02-03 DIAGNOSIS — N179 Acute kidney failure, unspecified: Secondary | ICD-10-CM | POA: Diagnosis not present

## 2022-02-03 DIAGNOSIS — N839 Noninflammatory disorder of ovary, fallopian tube and broad ligament, unspecified: Secondary | ICD-10-CM | POA: Diagnosis present

## 2022-02-03 DIAGNOSIS — C7802 Secondary malignant neoplasm of left lung: Secondary | ICD-10-CM

## 2022-02-03 DIAGNOSIS — K219 Gastro-esophageal reflux disease without esophagitis: Secondary | ICD-10-CM | POA: Diagnosis present

## 2022-02-03 DIAGNOSIS — Z79899 Other long term (current) drug therapy: Secondary | ICD-10-CM

## 2022-02-03 DIAGNOSIS — T380X5A Adverse effect of glucocorticoids and synthetic analogues, initial encounter: Secondary | ICD-10-CM | POA: Diagnosis present

## 2022-02-03 DIAGNOSIS — J9621 Acute and chronic respiratory failure with hypoxia: Secondary | ICD-10-CM

## 2022-02-03 DIAGNOSIS — E871 Hypo-osmolality and hyponatremia: Secondary | ICD-10-CM | POA: Diagnosis present

## 2022-02-03 DIAGNOSIS — Z7189 Other specified counseling: Secondary | ICD-10-CM | POA: Diagnosis not present

## 2022-02-03 DIAGNOSIS — Z923 Personal history of irradiation: Secondary | ICD-10-CM

## 2022-02-03 DIAGNOSIS — Z818 Family history of other mental and behavioral disorders: Secondary | ICD-10-CM

## 2022-02-03 DIAGNOSIS — R0781 Pleurodynia: Secondary | ICD-10-CM

## 2022-02-03 LAB — COMPREHENSIVE METABOLIC PANEL
ALT: 26 U/L (ref 0–44)
AST: 44 U/L — ABNORMAL HIGH (ref 15–41)
Albumin: 3.1 g/dL — ABNORMAL LOW (ref 3.5–5.0)
Alkaline Phosphatase: 384 U/L — ABNORMAL HIGH (ref 38–126)
Anion gap: 12 (ref 5–15)
BUN: 19 mg/dL (ref 6–20)
CO2: 20 mmol/L — ABNORMAL LOW (ref 22–32)
Calcium: 9.3 mg/dL (ref 8.9–10.3)
Chloride: 100 mmol/L (ref 98–111)
Creatinine, Ser: 0.66 mg/dL (ref 0.44–1.00)
GFR, Estimated: >60 mL/min (ref >60)
Glucose, Bld: 127 mg/dL — ABNORMAL HIGH (ref 70–99)
Potassium: 4.3 mmol/L (ref 3.5–5.1)
Sodium: 132 mmol/L — ABNORMAL LOW (ref 135–145)
Total Bilirubin: 0.3 mg/dL (ref 0.3–1.2)
Total Protein: 7.3 g/dL (ref 6.5–8.1)

## 2022-02-03 LAB — CBC WITH DIFFERENTIAL/PLATELET
Abs Immature Granulocytes: 3.9 10*3/uL — ABNORMAL HIGH (ref 0.00–0.07)
Band Neutrophils: 8 %
Basophils Absolute: 0 10*3/uL (ref 0.0–0.1)
Basophils Relative: 0 %
Eosinophils Absolute: 0 10*3/uL (ref 0.0–0.5)
Eosinophils Relative: 0 %
HCT: 26.6 % — ABNORMAL LOW (ref 36.0–46.0)
Hemoglobin: 9.1 g/dL — ABNORMAL LOW (ref 12.0–15.0)
Lymphocytes Relative: 2 %
Lymphs Abs: 0.6 10*3/uL — ABNORMAL LOW (ref 0.7–4.0)
MCH: 30.1 pg (ref 26.0–34.0)
MCHC: 34.2 g/dL (ref 30.0–36.0)
MCV: 88.1 fL (ref 80.0–100.0)
Metamyelocytes Relative: 3 %
Monocytes Absolute: 1.7 10*3/uL — ABNORMAL HIGH (ref 0.1–1.0)
Monocytes Relative: 6 %
Myelocytes: 9 %
Neutro Abs: 21.8 10*3/uL — ABNORMAL HIGH (ref 1.7–7.7)
Neutrophils Relative %: 70 %
Platelets: 105 10*3/uL — ABNORMAL LOW (ref 150–400)
Promyelocytes Relative: 2 %
RBC: 3.02 MIL/uL — ABNORMAL LOW (ref 3.87–5.11)
RDW: 15.2 % (ref 11.5–15.5)
WBC: 28 10*3/uL — ABNORMAL HIGH (ref 4.0–10.5)
nRBC: 0.3 % — ABNORMAL HIGH (ref 0.0–0.2)

## 2022-02-03 LAB — URINALYSIS, ROUTINE W REFLEX MICROSCOPIC
Bilirubin Urine: NEGATIVE
Glucose, UA: NEGATIVE mg/dL
Hgb urine dipstick: NEGATIVE
Ketones, ur: NEGATIVE mg/dL
Leukocytes,Ua: NEGATIVE
Nitrite: NEGATIVE
Protein, ur: NEGATIVE mg/dL
Specific Gravity, Urine: 1.031 — ABNORMAL HIGH (ref 1.005–1.030)
pH: 7 (ref 5.0–8.0)

## 2022-02-03 LAB — LACTIC ACID, PLASMA
Lactic Acid, Venous: 2.6 mmol/L (ref 0.5–1.9)
Lactic Acid, Venous: 1.7 mmol/L (ref 0.5–1.9)

## 2022-02-03 LAB — BLOOD GAS, ARTERIAL
Acid-Base Excess: 3.9 mmol/L — ABNORMAL HIGH (ref 0.0–2.0)
Bicarbonate: 27.7 mmol/L (ref 20.0–28.0)
Drawn by: 29503
FIO2: 55 %
O2 Saturation: 97.9 %
Patient temperature: 37
pCO2 arterial: 38 mmHg (ref 32–48)
pH, Arterial: 7.47 — ABNORMAL HIGH (ref 7.35–7.45)
pO2, Arterial: 71 mmHg — ABNORMAL LOW (ref 83–108)

## 2022-02-03 LAB — HCG, QUANTITATIVE, PREGNANCY: hCG, Beta Chain, Quant, S: 6 m[IU]/mL — ABNORMAL HIGH (ref <5)

## 2022-02-03 LAB — I-STAT BETA HCG BLOOD, ED (MC, WL, AP ONLY): I-stat hCG, quantitative: 9.1 m[IU]/mL — ABNORMAL HIGH (ref <5)

## 2022-02-03 LAB — BRAIN NATRIURETIC PEPTIDE: B Natriuretic Peptide: 144.2 pg/mL — ABNORMAL HIGH (ref 0.0–100.0)

## 2022-02-03 LAB — PROTIME-INR
INR: 0.9 (ref 0.8–1.2)
Prothrombin Time: 12.3 seconds (ref 11.4–15.2)

## 2022-02-03 LAB — SARS CORONAVIRUS 2 BY RT PCR: SARS Coronavirus 2 by RT PCR: NEGATIVE

## 2022-02-03 MED ORDER — SODIUM CHLORIDE 0.9 % IV SOLN
2.0000 g | Freq: Once | INTRAVENOUS | Status: AC
  Administered 2022-02-03: 2 g via INTRAVENOUS
  Filled 2022-02-03: qty 12.5

## 2022-02-03 MED ORDER — IOHEXOL 350 MG/ML SOLN
75.0000 mL | Freq: Once | INTRAVENOUS | Status: AC | PRN
  Administered 2022-02-03: 75 mL via INTRAVENOUS

## 2022-02-03 MED ORDER — VANCOMYCIN HCL 750 MG/150ML IV SOLN
750.0000 mg | Freq: Two times a day (BID) | INTRAVENOUS | Status: DC
  Administered 2022-02-04: 750 mg via INTRAVENOUS
  Filled 2022-02-03 (×2): qty 150

## 2022-02-03 MED ORDER — MORPHINE SULFATE 4 MG/ML IJ SOLN
2.0000 mg | INTRAMUSCULAR | Status: DC | PRN
  Administered 2022-02-03: 2 mg via INTRAVENOUS
  Administered 2022-02-03: 4 mg via INTRAVENOUS
  Administered 2022-02-04 (×2): 2 mg via INTRAVENOUS
  Administered 2022-02-04: 4 mg via INTRAVENOUS
  Administered 2022-02-04 – 2022-02-05 (×2): 2 mg via INTRAVENOUS
  Administered 2022-02-05: 4 mg via INTRAVENOUS
  Administered 2022-02-05: 2 mg via INTRAVENOUS
  Filled 2022-02-03 (×9): qty 1

## 2022-02-03 MED ORDER — METHYLPREDNISOLONE SODIUM SUCC 125 MG IJ SOLR
125.0000 mg | Freq: Every day | INTRAMUSCULAR | Status: AC
  Administered 2022-02-03 – 2022-02-04 (×3): 125 mg via INTRAVENOUS
  Filled 2022-02-03 (×3): qty 2

## 2022-02-03 MED ORDER — SODIUM CHLORIDE 0.9 % IV BOLUS
500.0000 mL | Freq: Once | INTRAVENOUS | Status: AC
  Administered 2022-02-03: 500 mL via INTRAVENOUS

## 2022-02-03 MED ORDER — ENOXAPARIN SODIUM 40 MG/0.4ML IJ SOSY
40.0000 mg | PREFILLED_SYRINGE | INTRAMUSCULAR | Status: DC
  Administered 2022-02-04 – 2022-02-07 (×4): 40 mg via SUBCUTANEOUS
  Filled 2022-02-03 (×5): qty 0.4

## 2022-02-03 MED ORDER — VANCOMYCIN HCL 1500 MG/300ML IV SOLN
1500.0000 mg | Freq: Once | INTRAVENOUS | Status: AC
  Administered 2022-02-03: 1500 mg via INTRAVENOUS
  Filled 2022-02-03: qty 300

## 2022-02-03 MED ORDER — SODIUM CHLORIDE 0.9 % IV SOLN
2.0000 g | Freq: Three times a day (TID) | INTRAVENOUS | Status: DC
  Administered 2022-02-04 – 2022-02-07 (×12): 2 g via INTRAVENOUS
  Filled 2022-02-03 (×12): qty 12.5

## 2022-02-03 MED ORDER — LORAZEPAM 2 MG/ML IJ SOLN
0.5000 mg | Freq: Once | INTRAMUSCULAR | Status: AC
  Administered 2022-02-03: 0.5 mg via INTRAVENOUS
  Filled 2022-02-03: qty 1

## 2022-02-03 MED ORDER — ACETAMINOPHEN 325 MG PO TABS: 650.0000 mg | ORAL_TABLET | Freq: Four times a day (QID) | ORAL | Status: DC | PRN

## 2022-02-03 MED ORDER — SODIUM CHLORIDE (PF) 0.9 % IJ SOLN
INTRAMUSCULAR | Status: AC
  Filled 2022-02-03: qty 50

## 2022-02-03 MED ORDER — SODIUM CHLORIDE 0.9 % IV SOLN: INTRAVENOUS | Status: DC

## 2022-02-03 NOTE — Assessment & Plan Note (Addendum)
Presented with tachycardia, tachypnea, leukocytosis. -suspect pneumonia in the setting of malignancy. Check sputum cx, legionella, strep pneumo urine antigen. -continue IV fluids overnight. Lactate has already trended down.

## 2022-02-03 NOTE — ED Provider Notes (Signed)
[facility] DEPT Provider Note   CSN: [id number] Arrival date & time: 02/04/2022  1324     History  Chief Complaint  Patient presents with   Shortness of Breath    Melanie Ramirez is a 41 y.o. 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  Notes that her shortness of breath has been exacerbated with rest as well as exertion.  She typically wears 2 L via nasal cannula constantly.  Just completed radiation therapy on 01/29/2022.  Her next round of chemotherapy will be on 02/10/2022.  Has associated dry cough.  Denies fever, chills, chest pain. Denies history of DVT/PE, anticoagulant use, or estrogen therapy. Denies history of heart failure.    The history is provided by the patient and a parent. No language interpreter was used.       Home Medications Prior to Admission medications   Medication Sig Start Date End Date Taking? Authorizing Provider  acetaminophen (TYLENOL) 500 MG tablet Take 1,000 mg by mouth every 6 (six) hours as needed for moderate pain or headache.    [provider]  albuterol (VENTOLIN HFA) 108 (90 Base) MCG/ACT inhaler Inhale 2 puffs into the lungs every 4 (four) hours as needed for wheezing or shortness of breath. 12/14/21   Kathie Dike, MD  allopurinol (ZYLOPRIM) 300 MG tablet TAKE 1 TABLET BY MOUTH EVERY DAY 01/24/22   Heath Lark, MD  buPROPion (WELLBUTRIN XL) 300 MG 24 hr tablet Take 300 mg by mouth daily. 11/24/21   [provider]  citalopram (CELEXA) 40 MG tablet Take 40 mg by mouth daily.     [provider]  dexamethasone (DECADRON) 4 MG tablet Take 85m BID. Instructed to taper in late August to 427mdaily as symptoms allow. 01/24/22   SqEppie GibsonMD  dextromethorphan-guaiFENesin ([facility]) 30-600 MG 12hr tablet Take 1 tablet by mouth 2 (two) times daily. 12/14/21   MeKathie Dike MD  famotidine (PEPCID) 20 MG tablet Take 20 mg by mouth 2 (two) times daily.    [provider]  levalbuterol (XPenne Lash0.63 MG/3ML nebulizer solution Take 3 mLs (0.63 mg total) by nebulization every 4 (four) hours as needed for wheezing or shortness of breath. 12/14/21   MeKathie DikeMD  lidocaine (XYLOCAINE) 2 % solution Mix 1 part 2% viscous lidocaine with 1 part water. Swallow 1020mf mixture, 30 minutes before meals and at bedtime, up to 4 times a day. 01/24/22   SquEppie GibsonD  LORazepam (ATIVAN) 1 MG tablet Take 1 tablet (1 mg total) by mouth every 6 (six) hours as needed for anxiety. 01/17/22 01/17/23  GorHeath LarkD  Magnesium 300 MG CAPS Take 300 mg by mouth every evening.    [provider]  morphine (MSIR) 15 MG tablet Take 1 tablet (15 mg total) by mouth every 4 (four) hours as needed for severe pain. 01/17/22   GorHeath LarkD  Multiple Vitamins-Minerals (MULTIVITAMIN WITH MINERALS) tablet Take 1 tablet by mouth daily.    [provider]  ondansetron (ZOFRAN) 8 MG tablet Take 1 tablet (8 mg total) by mouth every 8 (eight) hours as needed for nausea. 12/13/21   GorHeath LarkD  pantoprazole (PROTONIX) 40 MG tablet Take 1 tablet (40 mg total) by mouth 2 (two) times daily. 01/28/22   GorHeath LarkD  prochlorperazine (COMPAZINE) 10 MG tablet Take 1 tablet (10 mg  total) by mouth every 6 (six) hours as needed for nausea or vomiting. 12/13/21   Heath Lark, MD  Sodium Chloride Flush (NORMAL SALINE FLUSH) 0.9 % SOLN Inject 10 mLs into the vein daily. 12/20/21   Heath Lark, MD  Sodium Chloride Flush (NORMAL SALINE FLUSH) 0.9 % SOLN Please flush 10 ml normal saline daily 01/17/22   Heath Lark, MD  traZODone (DESYREL) 50 MG tablet Take 1 tablet (50 mg total) by mouth at bedtime. 12/14/21   Kathie Dike, MD      Allergies    Oxycodone    Review of Systems   Review of Systems  Constitutional:  Negative for chills and fever.  Respiratory:  Positive for cough (dry)  and shortness of breath.   Cardiovascular:  Negative for chest pain.  All other systems reviewed and are negative.   Physical Exam Updated Vital Signs BP 133/89   Pulse (!) 128   Temp 98.6 F (37 C) (Rectal) Comment: rectal temp per nurse  Resp (!) 23   Ht 5' 5"  (1.651 m)   Wt 63.5 kg   LMP 11/22/2021 (Exact Date) Comment: pt's husband has had a vasectomy.  SpO2 100%   BMI 23.30 kg/m  Physical Exam Vitals and nursing note reviewed.  Constitutional:      General: She is not in acute distress.    Appearance: She is not diaphoretic.  HENT:     Head: Normocephalic and atraumatic.     Mouth/Throat:     Pharynx: No oropharyngeal exudate.  Eyes:     General: No scleral icterus.    Conjunctiva/sclera: Conjunctivae normal.  Cardiovascular:     Rate and Rhythm: Regular rhythm. Tachycardia present.     Pulses: Normal pulses.     Heart sounds: Normal heart sounds.  Pulmonary:     Effort: Tachypnea present. No respiratory distress.     Breath sounds: Normal breath sounds. No wheezing.     Comments: Increased work of breathing Abdominal:     General: Bowel sounds are normal.     Palpations: Abdomen is soft. There is no mass.     Tenderness: There is no abdominal tenderness. There is no guarding or rebound.  Musculoskeletal:        General: Normal range of motion.     Cervical back: Normal range of motion and neck supple.     Comments: No edema noted to bilateral lower extremities.  Skin:    General: Skin is warm and dry.  Neurological:     Mental Status: She is alert.  Psychiatric:        Behavior: Behavior normal.     ED Results / Procedures / Treatments   Labs (all labs ordered are listed, but only abnormal results are displayed) Labs Reviewed  COMPREHENSIVE METABOLIC PANEL - Abnormal; Notable for the following components:      Result Value   Sodium 132 (*)    CO2 20 (*)    Glucose, Bld 127 (*)    Albumin 3.1 (*)    AST 44 (*)    Alkaline Phosphatase 384 (*)     All other components within normal limits  LACTIC ACID, PLASMA - Abnormal; Notable for the following components:   Lactic Acid, Venous 2.6 (*)    All other components within normal limits  CBC WITH DIFFERENTIAL/PLATELET - Abnormal; Notable for the following components:   WBC 28.0 (*)    RBC 3.02 (*)    Hemoglobin 9.1 (*)    HCT 26.6 (*)  Platelets 105 (*)    nRBC 0.3 (*)    Neutro Abs 21.8 (*)    Lymphs Abs 0.6 (*)    Monocytes Absolute 1.7 (*)    Abs Immature Granulocytes 3.90 (*)    All other components within normal limits  BRAIN NATRIURETIC PEPTIDE - Abnormal; Notable for the following components:   B Natriuretic Peptide 144.2 (*)    All other components within normal limits  BLOOD GAS, ARTERIAL - Abnormal; Notable for the following components:   pH, Arterial 7.47 (*)    pO2, Arterial 71 (*)    Acid-Base Excess 3.9 (*)    All other components within normal limits  HCG, QUANTITATIVE, PREGNANCY - Abnormal; Notable for the following components:   hCG, Beta Chain, Quant, S 6 (*)    All other components within normal limits  I-STAT BETA HCG BLOOD, ED ([facility], [facility], [facility] ONLY) - Abnormal; Notable for the following components:   I-stat hCG, quantitative 9.1 (*)    All other components within normal limits  SARS CORONAVIRUS 2 BY RT PCR  CULTURE, BLOOD (ROUTINE X 2)  CULTURE, BLOOD (ROUTINE X 2)  LACTIC ACID, PLASMA  PROTIME-INR  URINALYSIS, ROUTINE W REFLEX MICROSCOPIC    EKG None  Radiology CT Angio Chest PE W and/or Wo Contrast  Result Date: 02/15/2022 CLINICAL DATA:  Increased dyspnea since yesterday, pulmonary embolism suspected EXAM: CT ANGIOGRAPHY CHEST WITH CONTRAST TECHNIQUE: Multidetector CT imaging of the chest was performed using the standard protocol during bolus administration of intravenous contrast. Multiplanar CT image reconstructions and MIPs were obtained to evaluate the vascular anatomy. RADIATION DOSE REDUCTION: This exam was performed according to the  departmental dose-optimization program which includes automated exposure control, adjustment of the mA and/or kV according to patient size and/or use of iterative reconstruction technique. CONTRAST:  58m OMNIPAQUE IOHEXOL 350 MG/ML SOLN COMPARISON:  Radiographs earlier today and CT chest 01/14/2022 FINDINGS: Cardiovascular: Satisfactory opacification of the pulmonary arteries to the segmental level. No evidence of pulmonary embolism. Normal heart size. Similar small pericardial effusion. Decreased compression of the SVC. Mediastinum/Nodes: Extensive bilateral mediastinal hilar adenopathy has slightly decreased since 01/14/2022. For example the right paratracheal node measures 2.0 cm, previously 3.2 cm in short axis with decreased compression of the trachea. Dominant prevascular node measures 2.4 cm, previously 2.9 cm. Similar mass effect on the right middle lobe and right upper lobe bronchi. Unremarkable esophagus. Normal thyroid. Lungs/Pleura: Bilateral pulmonary nodules and confluent masses are diffusely present throughout both lungs not significantly changed. Interstitial thickening in the lung bases favored to represent carcinomatosis. No pleural effusion or pneumothorax. Upper Abdomen: Retrocrural and celiac axis lymphadenopathy. Peripherally enhancing centrally hypoattenuating lesions in the liver incompletely evaluated due to early phase of contrast but compatible with metastases. Musculoskeletal: No osseous metastasis or acute abnormality. Review of the MIP images confirms the above findings. IMPRESSION: 1. Negative for acute pulmonary embolism. 2. Extensive pulmonary nodularity, confluent masses and interstitial thickening compatible with malignancy is unchanged. 3. Interval decrease in mediastinal and hilar lymphadenopathy with decreased compression on the SVC and trachea. 4. Retroperitoneal metastatic lymphadenopathy in the upper abdomen and probable multiple hepatic metastases. Electronically Signed    By: TPlacido SouM.D.   On: 02/16/2022 18:51   DG Chest 2 View  Result Date: 02/06/2022 CLINICAL DATA:  Possible sepsis, increasing shortness of breath EXAM: CHEST - 2 VIEW COMPARISON:  01/13/2022 FINDINGS: Cardiac size is within normal limits. Central pulmonary vessels are more prominent. There is a diffuse increase in interstitial and  alveolar markings in both lungs with possible interval worsening. Lateral CP angles are essentially clear. There is no pneumothorax. Tip of right PICC line is seen in the region of superior vena cava. IMPRESSION: Central pulmonary vessels are prominent suggesting possible CHF. Extensive diffuse increased interstitial and alveolar markings are seen in both lungs with interval worsening. Findings suggest possible pulmonary edema or multifocal pneumonia. Possibility of underlying neoplastic process and underlying scarring is not excluded. Electronically Signed   By: Elmer Picker M.D.   On: 02/19/2022 14:42    Procedures Procedures    Medications Ordered in ED Medications  vancomycin (VANCOREADY) IVPB 1500 mg/300 mL (1,500 mg Intravenous New Bag/Given 02/20/2022 1531)  ceFEPIme (MAXIPIME) 2 g in sodium chloride 0.9 % 100 mL IVPB (0 g Intravenous Stopped 01/29/2022 1528)  sodium chloride 0.9 % bolus 500 mL (500 mLs Intravenous New Bag/Given 01/31/2022 1510)  LORazepam (ATIVAN) injection 0.5 mg (0.5 mg Intravenous Given 01/25/2022 1529)    ED Course/ Medical Decision Making/ A&P Clinical Course as of 01/30/2022 2209  Mon Feb 03, 2022  1447 Lactic Acid, Venous(!!): 2.6 [SB]  1500 Attending in to evaluate patient and agrees with treatment plan. [SB]  1607 Consult with Hospitalist, Dr. Starla Link who recommends  [SB]  1621 Discussion with Medical Oncologist, Dr. Alvy Bimler who evaluated the patient in the ED.  [SB]  1641 Discussed with patient and family regarding treatment plan, patient and family agreeable at this time.  Discussed with patient and family regarding lab and  imaging findings.  Patient also notes improvement of symptoms with treatment regimen in the ED. [SB]  1743 Discussion held with patient and patients mother regarding slightly elevated hcg in the ED. Patient notes that her and her spouse have taken permanent action against pregnancy.  Discussed with patient and family that if there was a pregnancy then radiation could potentially cause harm to the fetus, patient understandable and notes that she would like to proceed with a CT scan at this time. [SB]  1824 Lactic Acid, Venous: 1.7 [SB]  1916 Discussed plans with patient at bedside. Pt agreeable at this time.  [SB]  2139 Consult with Hospitalist, Dr. Flossie Buffy who agrees with admission. [SB]    Clinical Course User Index [SB] Raylyn Carton A, PA-C                           Medical Decision Making Amount and/or Complexity of Data Reviewed Labs: ordered. Decision-making details documented in ED Course. Radiology: ordered.  Risk Prescription drug management. Decision regarding hospitalization.   Patient presents to the ED complaining of shortness of breath onset yesterday and worsening today.  Wears 2 L of oxygen via nasal cannula continuously.  Recently completed radiation therapy last week.  Will start new round of chemotherapy on 02/10/2022.  Denies history of DVT/PE, estrogen use.  Vital signs patient is slightly tachypneic and tachycardic.  Patient afebrile. On exam patient with increased work of breathing, tachycardia, tachypneic. No pitting edema noted bilaterally. Differential diagnosis includes CHF, pneumothorax, pneumonia, PE, malignancy.   Co morbidities that complicate the patient evaluation: Small cell carcinoma metastatic to bilateral lungs  Additional history obtained:  Additional history obtained from Parent External records from outside source obtained and reviewed including: Patient was evaluated by her oncologist on 01/14/2022 where she had a CTA of the chest completed at that time  that was negative for PE.  It did show compression of the SVC as well as malignancy throughout.  At that time patient was started on steroids.  Labs:  I ordered, and personally interpreted labs.  The pertinent results include:   Initial lactic elevated 2.6, repeat lactic decreased at 1.7. Blood cultures ordered with results pending at time of admission. COVID swab ordered results pending at time of admission. I-STAT beta-hCG slightly elevated 9.1, hCG serum qualitative ordered. CBC with leukocytosis at 28, hemoglobin downtrending 9.1. CMP with slightly decreased sodium at 132, slight elevated glucose at 127, alk phos elevated at 384 otherwise unremarkable. Coags unremarkable Urinalysis unremarkable. ABG overall unremarkable.  Imaging: I ordered imaging studies including chest x-ray I independently visualized and interpreted imaging which showed:  Central pulmonary vessels are prominent suggesting possible CHF.  Extensive diffuse increased interstitial and alveolar markings are  seen in both lungs with interval worsening. Findings suggest  possible pulmonary edema or multifocal pneumonia.   CTA chest:  1. Negative for acute pulmonary embolism.  2. Extensive pulmonary nodularity, confluent masses and interstitial  thickening compatible with malignancy is unchanged.  3. Interval decrease in mediastinal and hilar lymphadenopathy with  decreased compression on the SVC and trachea.  4. Retroperitoneal metastatic lymphadenopathy in the upper abdomen  and probable multiple hepatic metastases.   I agree with the radiologist interpretation  Medications:  I ordered medication including IV fluids, Ativan, cefepime, vancomycin for antibiotic prophylaxis Reevaluation of the patient after these medicines and interventions, I reevaluated the patient and found that they have improved I have reviewed the patients home medicines and have made adjustments as needed  Critical Interventions Patient  placed on Venturi mask   Consultations: I requested consultation with the Hospitalist, and discussed lab and imaging findings as well as pertinent plan - they recommend: Admission to the hospital.  Disposition: Presentation suspicious for shortness of breath exacerbated in the setting of likely pneumonia and malignancy.  Doubt pneumothorax or PE at this time.  Patient with slightly elevated BNP, however no recent echocardiogram.  No fluid overload noted on exam.  After consideration of the diagnostic results and the patients response to treatment, I feel that the patient would benefit from Admission to the hospital.  Discussed with patient and family member regarding admission plans.  Patient and family agreeable at this time.  Patient appears safe for admission at this time.  This chart was dictated using voice recognition software, Dragon. Despite the best efforts of this provider to proofread and correct errors, errors may still occur which can change documentation meaning.  Final Clinical Impression(s) / ED Diagnoses Final diagnoses:  Shortness of breath  Community acquired pneumonia, unspecified laterality    Rx / DC Orders ED Discharge Orders     None         Suhaas Agena A, PA-C 02/06/2022 2212    Sherwood Gambler, MD 02/04/22 [phone]

## 2022-02-03 NOTE — Telephone Encounter (Signed)
I will keep an eye on her chart 

## 2022-02-03 NOTE — Telephone Encounter (Addendum)
Returned call to husband. Melanie Ramirez has been struggling today and having a rough day. Oxygen saturations have been fluctuating, 89% to 93%.  He called 911 and she is in the ambulance going to [facility].

## 2022-02-03 NOTE — Assessment & Plan Note (Signed)
Pt to LUQ on exam. She attributes in to recent steroid use causing her to have several reflux as well. -checked KUB which showed normal bowel gas panel -continue to monitor and if pain is worse would get CT A/P

## 2022-02-03 NOTE — Assessment & Plan Note (Signed)
Decreasing Hgb and plt count secondary likely due to chemo and radiation. -stable with no transfusion requirements.

## 2022-02-03 NOTE — Assessment & Plan Note (Addendum)
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 Baseline on 2L.  -required venturi mask on 5% FiO2 on presentation -CTA chest negative PE and actually shows decrease in her lymphadenopathy and compression of mass on SVC and trachea.  -no clear imaging finding of pneumonia but she is tachycardic with leukocytosis. Although oncology believes her leukocytosis could also be from G-CSF and recent dexamethasone as well. Continue IV vancomycin and cefepime pending blood cultures -Goal O2 >92% and wean as tolerated  -oncology also recommended starting stress dose of Solu-medrol x 3 days as well as morphine to help with pain and increase work of breathing.

## 2022-02-03 NOTE — Assessment & Plan Note (Addendum)
Secondary to potential pneumonia. CTA chest shows small pericardial effusion. -keep on continuous telemetry and IV fluids -consider low dose beta-blocker if it remains persistent despite fluid and antibiotic tx

## 2022-02-03 NOTE — Progress Notes (Signed)
Melanie Ramirez   DOB:11-13-1980   WU#:[id number]    ASSESSMENT & PLAN:  Metastatic lung cancer She is completed a course of radiation treatment and is due for chemotherapy next week Continue supportive care  Respiratory failure, mild pleuritic chest pain I reviewed the case with the ER physician I recommend CT angiogram to rule out PE The chest x-ray is not helpful I will give her stress dose Solu-Medrol for 3 days as well as as needed morphine sulfate to help with her pain and increased work of breathing  Leukocytosis Could be due to infection versus recent G-CSF versus recent dexamethasone Agree with broad-spectrum coverage until infection is ruled out  Mild pancytopenia Due to rate and chemotherapy She does not need transfusion support  Mild anxiety As needed morphine as needed  Goals of care discussions Resolution of worsening shortness of breath  Discharge planning I anticipate she will be here over the next few days She is aware that I will be gone for vacation and return on Friday Please call if questions arise  All questions were answered. The patient knows to call the clinic with any problems, questions or concerns.   The total time spent in the appointment was 60 minutes encounter with patients including review of chart and various tests results, discussions about plan of care and coordination of care plan  Heath Lark, MD 02/16/2022 4:03 PM  Subjective:  Her husband called [place] this morning.  She was brought into the emergency department due to worsening shortness of breath.  She is seen in the emergency room.  Her mother is by the bedside.  She has worsening shortness of breath and difficulties finishing sentence.  She has mild pleuritic chest pain with deep breathing.  She had difficulties lying down on her right side.  Denies fever or chills  Objective:  Vitals:   02/05/2022 1505 01/28/2022 1530  BP: 115/81 133/89  Pulse: (!) 132 (!) 128  Resp: (!) 30 (!) 23   Temp:    SpO2: 98% 100%     Intake/Output Summary (Last 24 hours) at 01/28/2022 1603 Last data filed at 02/11/2022 1528 Gross per 24 hour  Intake 100 ml  Output --  Net 100 ml    GENERAL:alert.  She has oxygen delivered via nonrebreathing mask.  She had has increased work of breathing SKIN: skin color, texture, turgor are normal, no rashes or significant lesions EYES: normal, Conjunctiva are pink and non-injected, sclera clear OROPHARYNX:no exudate, no erythema and lips, buccal mucosa, and tongue normal  NECK: supple, thyroid normal size, non-tender, without nodularity LYMPH:  no palpable lymphadenopathy in the cervical, axillary or inguinal LUNGS: Increased breathing effort.  Noted wheezes on the upper lobes HEART: tachycardia cardia, no murmurs and no lower extremity edema ABDOMEN:abdomen soft, non-tender and normal bowel sounds Musculoskeletal:no cyanosis of digits and no clubbing  NEURO: alert & oriented x 3 with fluent speech, no focal motor/sensory deficits   Labs:  Recent Labs    12/03/21 2346 12/04/21 0500 01/03/22 1426 01/14/22 0814 02/11/2022 1349  NA  --    < > 136 132* 132*  K  --    < > 3.8 3.6 4.3  CL  --    < > 104 101 100  CO2  --    < > 25 20* 20*  GLUCOSE  --    < > 98 172* 127*  BUN  --    < > 25* 28* 19  CREATININE  --    < >  0.62 0.73 0.66  CALCIUM  --    < > 8.7* 9.3 9.3  GFRNONAA  --    < > >60 >60 >60  PROT 7.4   < > 6.9 7.0 7.3  ALBUMIN 3.3*   < > 3.7 3.9 3.1*  AST 46*   < > 18 46* 44*  ALT 28   < > '14 23 26  '$ ALKPHOS 107   < > 97 170* 384*  BILITOT 0.5   < > 0.3 0.3 0.3  BILIDIR <0.1  --   --   --   --   IBILI NOT CALCULATED  --   --   --   --    < > = values in this interval not displayed.    Studies: I personally reviewed her chest x-ray DG Chest 2 View  Result Date: 01/26/2022 CLINICAL DATA:  Possible sepsis, increasing shortness of breath EXAM: CHEST - 2 VIEW COMPARISON:  01/13/2022 FINDINGS: Cardiac size is within normal limits.  Central pulmonary vessels are more prominent. There is a diffuse increase in interstitial and alveolar markings in both lungs with possible interval worsening. Lateral CP angles are essentially clear. There is no pneumothorax. Tip of right PICC line is seen in the region of superior vena cava. IMPRESSION: Central pulmonary vessels are prominent suggesting possible CHF. Extensive diffuse increased interstitial and alveolar markings are seen in both lungs with interval worsening. Findings suggest possible pulmonary edema or multifocal pneumonia. Possibility of underlying neoplastic process and underlying scarring is not excluded. Electronically Signed   By: Elmer Picker M.D.   On: 02/02/2022 14:42   CT Angio Chest Pulmonary Embolism (PE) W or WO Contrast  Result Date: 01/14/2022 CLINICAL DATA:  Pulmonary embolus suspected with high probability. EXAM: CT ANGIOGRAPHY CHEST WITH CONTRAST TECHNIQUE: Multidetector CT imaging of the chest was performed using the standard protocol during bolus administration of intravenous contrast. Multiplanar CT image reconstructions and MIPs were obtained to evaluate the vascular anatomy. RADIATION DOSE REDUCTION: This exam was performed according to the departmental dose-optimization program which includes automated exposure control, adjustment of the mA and/or kV according to patient size and/or use of iterative reconstruction technique. CONTRAST:  19m OMNIPAQUE IOHEXOL 350 MG/ML SOLN COMPARISON:  01/02/2022 FINDINGS: Cardiovascular: Good opacification of the central and segmental pulmonary arteries. No focal filling defects. No evidence of significant pulmonary embolus. Normal heart size. Small pericardial effusion. Normal caliber thoracic aorta. No aortic dissection. Great vessel origins are patent. A right central venous catheter is present with tip at the lower SVC. The superior vena cava is extrinsically compressed and is diminutive in caliber. This represents a change  since previous study. Mediastinum/Nodes: Extensive lymphadenopathy throughout the mediastinum and hilar regions. Right paratracheal lymph nodes measure up to about 4.7 cm short axis dimension. Prominent lymph nodes extending along the pulmonary arteries and bronchi. Right hilar/suprahilar mass lesion measuring up to about 4.6 cm diameter. This is causing extrinsic compression of right middle lobe and right upper lobe bronchi. Esophagus is decompressed. Thyroid gland is unremarkable. Lungs/Pleura: Multiple pulmonary nodules and confluent masses demonstrated throughout. Nodules are mostly in a perihilar distribution. Interstitial changes in the lung bases may represent interstitial carcinomatosis. No pleural effusions. No pneumothorax. Upper Abdomen: Retrocrural and celiac axis lymphadenopathy. Heterogeneous parenchymal pattern to the liver. This is incompletely evaluated due to early phase of contrast but likely metastatic. Musculoskeletal: No bone metastasis are demonstrated. Review of the MIP images confirms the above findings. IMPRESSION: 1. No evidence of significant pulmonary embolus.  2. Extensive malignancy demonstrated throughout the chest. Prominent lymphadenopathy throughout the mediastinum, hilar, and peribronchovascular regions. Right hilar/suprahilar mass lesion. 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 3. Lymphadenopathy causes new extrinsic compression of the SVC resulting in SVC obstruction. 4. Metastatic lymphadenopathy in the celiac axis of the upper abdomen and probably in the liver. 5. Extrinsic compression of right middle lobe and right upper lobe bronchi with postobstructive changes. Electronically Signed   By: Lucienne Capers M.D.   On: 01/14/2022 17:15   DG Chest 2 View  Result Date: 01/14/2022 CLINICAL DATA:  metastatic cancer to lungs worsening SOB EXAM: CHEST - 2 VIEW COMPARISON:  Chest CT 01/02/2022, radiograph  12/10/2021 FINDINGS: Right upper extremity catheter tip overlies the distal superior vena cava. Unchanged cardiomediastinal silhouette with known lymphadenopathy. There are unchanged nodular and reticular opacities bilaterally consistent with known metastatic disease. No pleural effusion. No pneumothorax. Bones are unchanged. IMPRESSION: Stable findings of pulmonary metastatic disease and mediastinal lymphadenopathy. No new acute findings. Electronically Signed   By: Maurine Simmering M.D.   On: 01/14/2022 08:02

## 2022-02-03 NOTE — Progress Notes (Signed)
PHARMACY -  BRIEF ANTIBIOTIC NOTE   Pharmacy has received consult(s) for vancomycin from an ED provider.  The patient's profile has been reviewed for ht/wt/allergies/indication/available labs.    One time order(s) placed for vancomycin 1500 mg + cefepime 2 g  Further antibiotics/pharmacy consults should be ordered by admitting physician if indicated.                       Thank you,  Tawnya Crook, PharmD, BCPS Clinical Pharmacist 02/05/2022 3:03 PM

## 2022-02-03 NOTE — ED Notes (Signed)
Report called to cindy at this time

## 2022-02-03 NOTE — H&P (Signed)
History and Physical    Patient: Melanie Ramirez TKZ:[id number] DOB: 06-Feb-1981 DOA: 01/26/2022 DOS: the patient was seen and examined on 02/05/2022 PCP: Scheryl Marten, [place]  Patient coming from: Home  Chief Complaint:  Chief Complaint  Patient presents with   Shortness of Breath   HPI: Melanie Ramirez is a 41 y.o. 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   Pt has been having increasing shortness of breath for the few days. Both at rest and worse with exertion. Palpitations from fast HR making her breathing worse.Baseline on 2L via [place]. Has cough but no worse than usual. Has felt hot and chills. No nausea, vomiting or diarrhea, or constipation. No sick contact.   In the ED, she was afebrile, tachycardic with heart rate up to the 130 bpm, tachypneic requiring 55% FiO2 on Venturi mask.  She was unable to tolerate high flow nasal cannula.   ABG with pH of 7.47, normal CO2 38.  Had leukocytosis of 28 K, lactate of 2.6.  Hemoglobin of 9.1 which is down from her baseline of about 11-12.  Thrombocytopenia with platelet of 104.  COVID PCR negative.  UA negative. hCG is elevated to 9.1 but pregnancy highly unlikely as patient is currently on radiation with malignancy.  CTA chest negative for pulmonary embolism.  Small pericardial effusion.  Extensive pulmonary nodularity, confluent masses and interstitial thickening compatible with malignancy is unchanged.  Interval decrease in mediastinal and hilar lymphadenopathy.  Decreased compression on the SVC and trachea. Retroperitoneal metastatic lymphadenopathy in the upper abdomen and probable multiple hepatic metastasis.  She was evaluated by her primary oncologist Dr. Alvy Bimler in the ED. She recommended starting stress dose of Solu-medrol x 3 days as well as morphine  to help with pain and increase work of breathing. Thought leukocytosis could be infection vs recent G-CSF vs recent dexamethasone.  She was started on IV vancomycin and cefepime and hospitalist was called for admission.  Review of Systems: As mentioned in the history of present illness. All other systems reviewed and are negative. Past Medical History:  Diagnosis Date   Active labor 03/21/2011   Anxiety    Depression    Family history of breast cancer    Family history of colon cancer    Headache(784.0)    occ. migraines with periods   Pneumonia    as teenager   Postpartum care following vaginal delivery (9/28) 03/21/2011   Past Surgical History:  Procedure Laterality Date   DENTAL SURGERY     DILATION AND EVACUATION N/A 07/29/2013   Procedure: Ultrasound guided Suction DILATATION AND EVACUATION (D&E) 2ND TRIMESTER with tissue sent for chromosome analysis;  Surgeon: Lovenia Kim, MD;  Location: [facility];  Service: Gynecology;  Laterality: N/A;   FOOT SURGERY Left 2018   calcaneus   VENTRAL HERNIA REPAIR N/A 12/08/2018   Procedure: LAPAROSCOPIC VENTRAL WALL HERNIA REPAIR WITH MESH;  Surgeon: Michael Boston, MD;  Location: [facility];  Service: General;  Laterality: N/A;   Social History:  reports that she has never smoked. She has never used smokeless tobacco. She reports current alcohol use of about 1.0 standard drink of alcohol per week. She reports that she does not use drugs.  Allergies  Allergen Reactions   Oxycodone     Had poor tolerance:unable to sleep &  mild hallucination    Family History  Problem Relation Age of Onset   Depression Mother    Skin cancer Mother    Skin cancer Father    Depression Father    Depression Sister    Depression Brother    Heart failure Maternal Grandfather    Breast cancer Paternal Grandmother        dx > 39   Colon cancer Paternal Grandmother        dx > 59   Heart disease Paternal Grandfather    Depression Daughter    Breast cancer  Other        MGM's sister dx > 41    Prior to Admission medications   Medication Sig Start Date End Date Taking? Authorizing Provider  acetaminophen (TYLENOL) 500 MG tablet Take 1,000 mg by mouth every 6 (six) hours as needed for moderate pain or headache.    [provider]  albuterol (VENTOLIN HFA) 108 (90 Base) MCG/ACT inhaler Inhale 2 puffs into the lungs every 4 (four) hours as needed for wheezing or shortness of breath. 12/14/21   Kathie Dike, MD  allopurinol (ZYLOPRIM) 300 MG tablet TAKE 1 TABLET BY MOUTH EVERY DAY 01/24/22   Heath Lark, MD  buPROPion (WELLBUTRIN XL) 300 MG 24 hr tablet Take 300 mg by mouth daily. 11/24/21   [provider]  citalopram (CELEXA) 40 MG tablet Take 40 mg by mouth daily.     [provider]  dexamethasone (DECADRON) 4 MG tablet Take '4mg'$  BID. Instructed to taper in late August to '4mg'$  daily as symptoms allow. 01/24/22   Eppie Gibson, MD  dextromethorphan-guaiFENesin [facility] DM) 30-600 MG 12hr tablet Take 1 tablet by mouth 2 (two) times daily. 12/14/21   Kathie Dike, MD  famotidine (PEPCID) 20 MG tablet Take 20 mg by mouth 2 (two) times daily.    [provider]  levalbuterol Penne Lash) 0.63 MG/3ML nebulizer solution Take 3 mLs (0.63 mg total) by nebulization every 4 (four) hours as needed for wheezing or shortness of breath. 12/14/21   Kathie Dike, MD  lidocaine (XYLOCAINE) 2 % solution Mix 1 part 2% viscous lidocaine with 1 part water. Swallow 82m of mixture, 30 minutes before meals and at bedtime, up to 4 times a day. 01/24/22   SEppie Gibson MD  LORazepam (ATIVAN) 1 MG tablet Take 1 tablet (1 mg total) by mouth every 6 (six) hours as needed for anxiety. 01/17/22 01/17/23  GHeath Lark MD  Magnesium 300 MG CAPS Take 300 mg by mouth every evening.    [provider]  morphine (MSIR) 15 MG tablet Take 1 tablet (15 mg total) by mouth every 4 (four) hours as needed for severe pain. 01/17/22   GHeath Lark MD  Multiple  Vitamins-Minerals (MULTIVITAMIN WITH MINERALS) tablet Take 1 tablet by mouth daily.    [provider]  ondansetron (ZOFRAN) 8 MG tablet Take 1 tablet (8 mg total) by mouth every 8 (eight) hours as needed for nausea. 12/13/21   GHeath Lark MD  pantoprazole (PROTONIX) 40 MG tablet Take 1 tablet (40 mg total) by mouth 2 (two) times daily. 01/28/22   GHeath Lark MD  prochlorperazine (COMPAZINE) 10 MG tablet Take 1 tablet (10 mg total) by mouth every 6 (six) hours as needed for nausea or vomiting. 12/13/21   GHeath Lark MD  Sodium Chloride Flush (NORMAL SALINE FLUSH) 0.9 % SOLN Inject 10 mLs into the vein daily. 12/20/21   GHeath Lark MD  Sodium Chloride Flush (NORMAL SALINE FLUSH) 0.9 % SOLN Please flush 10  ml normal saline daily 01/17/22   Heath Lark, MD  traZODone (DESYREL) 50 MG tablet Take 1 tablet (50 mg total) by mouth at bedtime. 12/14/21   Kathie Dike, MD    Physical Exam: Vitals:   02/20/2022 2015 02/04/2022 2030 02/05/2022 2100 02/20/2022 2300  BP: (!) 116/91 (!) 127/93 135/82   Pulse: (!) 126 (!) 138 (!) 133   Resp: 17 (!) 29 (!) 22   Temp:    (!) 97.5 F (36.4 C)  TempSrc:    Oral  SpO2: 96% (!) 87% 93%   Weight:      Height:       Constitutional: ill appearing young female laying on her right side on Venturi mask with tachypnea and increased work of breathing when speaking. eyes:  lids and conjunctivae normal ENMT: Mucous membranes are moist.  Neck: normal, supple Respiratory: Diffuse rhonchi throughout on Venturi mask was increased work of breathing with exertion and talking.  No accessory muscle use.  Cardiovascular: Sinus tachycardia, no murmurs / rubs / gallops. No extremity edema. 2+ pedal pulses. No carotid bruits.  Abdomen: Soft, mildly distended, guarding with palpation of left upper quadrant, no rebound tenderness, bowel sounds positive.  Musculoskeletal: no clubbing / cyanosis. No joint deformity upper and lower extremities. Good ROM, no contractures. Normal muscle  tone.  Skin: no rashes, lesions, ulcers.  Neurologic: CN 2-12 grossly intact.  Strength 5/5 in all 4.  Psychiatric: Normal judgment and insight. Alert and oriented x 3. Normal mood. Data Reviewed:  See HPI  Assessment and Plan: * Acute on chronic respiratory failure with hypoxia ([place]) In the setting of metastatic small cell carcinoma to brain. Baseline on 2L.  -required venturi mask on 5% FiO2 on presentation -CTA chest negative PE and actually shows decrease in her lymphadenopathy and compression of mass on SVC and trachea.  -no clear imaging finding of pneumonia but she is tachycardic with leukocytosis. Although oncology believes her leukocytosis could also be from G-CSF and recent dexamethasone as well. Continue IV vancomycin and cefepime pending blood cultures -Goal O2 >92% and wean as tolerated  -oncology also recommended starting stress dose of Solu-medrol x 3 days as well as morphine to help with pain and increase work of breathing.  Tachycardia Secondary to potential pneumonia. CTA chest shows small pericardial effusion. -keep on continuous telemetry and IV fluids -consider low dose beta-blocker if it remains persistent despite fluid and antibiotic tx  Abdominal pain Pt to LUQ on exam. She attributes in to recent steroid use causing her to have several reflux as well. -checked KUB which showed normal bowel gas panel -continue to monitor and if pain is worse would get CT A/P  Sepsis [facility]) Presented with tachycardia, tachypnea, leukocytosis. -suspect pneumonia in the setting of malignancy. Check sputum cx, legionella, strep pneumo urine antigen. -continue IV fluids overnight. Lactate has already trended down.  Pancytopenia, acquired ([place]) Decreasing Hgb and plt count secondary likely due to chemo and radiation. -stable with no transfusion requirements.  Small cell carcinoma metastatic to both lungs [facility]) Follows with Dr. Alvy Bimler who has evaluated her here.  -recently  completed radiation around 8/9 and planned to start chemotherapy next week      Advance Care Planning: Full code-pt initially wanted DNR but later rescinded her decision  Consults: oncology  Family Communication: none at bedside  Severity of Illness: The appropriate patient status for this patient is INPATIENT. Inpatient status is judged to be reasonable and necessary in order to provide the required intensity  of service to ensure the patient's safety. The patient's presenting symptoms, physical exam findings, and initial radiographic and laboratory data in the context of their chronic comorbidities is felt to place them at high risk for further clinical deterioration. Furthermore, it is not anticipated that the patient will be medically stable for discharge from the hospital within 2 midnights of admission.   * I certify that at the point of admission it is my clinical judgment that the patient will require inpatient hospital care spanning beyond 2 midnights from the point of admission due to high intensity of service, high risk for further deterioration and high frequency of surveillance required.*  Author: Orene Desanctis, DO 02/19/2022 11:37 PM  For on call review [email or web address].

## 2022-02-03 NOTE — Progress Notes (Signed)
Pharmacy Antibiotic Note  Melanie Ramirez is a 41 y.o. female admitted on 01/25/2022 with leukocytosis/sepsis.  PMH significant for metastatic lung cancer, recently completing XRT with plans to begin chemotherapy.  Pharmacy has been consulted for Vancomycin and Cefepime dosing.  Patient has received Vancomycin '1500mg'$  IV and Cefepime 2gm IV x 1 dose each in the ED.  Plan: Cefepime 2gm IV q8h Vancomycin 750 mg IV Q 12 hrs. Goal AUC 400-550.  Expected AUC: 438  SCr used: 0.8 (rounded up from 0.66) Follow renal function F/u culture results and sensitivities  Height: '5\' 5"'$  (165.1 cm) Weight: 63.5 kg (140 lb) IBW/kg (Calculated) : 57  Temp (24hrs), Avg:98.2 F (36.8 C), Min:97.9 F (36.6 C), Max:98.6 F (37 C)  Recent Labs  Lab 02/18/2022 1349 01/24/2022 1635  WBC 28.0*  --   CREATININE 0.66  --   LATICACIDVEN 2.6* 1.7    Estimated Creatinine Clearance: 85 mL/min (by C-G formula based on SCr of 0.66 mg/dL).    Allergies  Allergen Reactions   Oxycodone     Had poor tolerance:unable to sleep &  mild hallucination    Antimicrobials this admission: 8/14 Cefepime >>   8/14 Vancomycin >>    Dose adjustments this admission:    Microbiology results: 8/14 BCx:      Thank you for allowing pharmacy to be a part of this patient's care.  Everette Rank, PharmD 02/06/2022 10:42 PM

## 2022-02-03 NOTE — ED Triage Notes (Signed)
Cancer patient with increased SOB since yesterday.  Originally wears 2L [place] and was 87% and 98% on NRB.  Feels as though she can't get a deep breath.  Patient tachy with EMS in 130's

## 2022-02-03 NOTE — ED Notes (Signed)
Pt Husband: Evonna Stoltz, would like his number placed in notes. Asked for [place] to call if anything changes with room assignments or if we need to call for anything at all.  Number is: [phone]

## 2022-02-03 NOTE — ED Notes (Signed)
After giving pt the '4mg'$  of morphine she requested she has asked that we continue or try to continue with the administration every 4 hours. She attempted to hold off but started hurting. The pt also advised she wants the lower of the doses when its time again.

## 2022-02-03 NOTE — Assessment & Plan Note (Signed)
Follows with Dr. Alvy Bimler who has evaluated her here.  -recently completed radiation around 8/9 and planned to start chemotherapy next week

## 2022-02-04 ENCOUNTER — Telehealth: Payer: Self-pay

## 2022-02-04 DIAGNOSIS — C801 Malignant (primary) neoplasm, unspecified: Secondary | ICD-10-CM

## 2022-02-04 DIAGNOSIS — D61818 Other pancytopenia: Secondary | ICD-10-CM

## 2022-02-04 DIAGNOSIS — R0781 Pleurodynia: Secondary | ICD-10-CM

## 2022-02-04 DIAGNOSIS — R0602 Shortness of breath: Secondary | ICD-10-CM | POA: Diagnosis not present

## 2022-02-04 DIAGNOSIS — C7801 Secondary malignant neoplasm of right lung: Secondary | ICD-10-CM | POA: Diagnosis not present

## 2022-02-04 DIAGNOSIS — Z7189 Other specified counseling: Secondary | ICD-10-CM | POA: Diagnosis not present

## 2022-02-04 DIAGNOSIS — Z515 Encounter for palliative care: Secondary | ICD-10-CM

## 2022-02-04 DIAGNOSIS — J9621 Acute and chronic respiratory failure with hypoxia: Secondary | ICD-10-CM | POA: Diagnosis not present

## 2022-02-04 DIAGNOSIS — J189 Pneumonia, unspecified organism: Secondary | ICD-10-CM

## 2022-02-04 DIAGNOSIS — C7800 Secondary malignant neoplasm of unspecified lung: Secondary | ICD-10-CM

## 2022-02-04 DIAGNOSIS — C7802 Secondary malignant neoplasm of left lung: Secondary | ICD-10-CM | POA: Diagnosis not present

## 2022-02-04 LAB — CBC
HCT: 24.5 % — ABNORMAL LOW (ref 36.0–46.0)
HCT: 27.1 % — ABNORMAL LOW (ref 36.0–46.0)
Hemoglobin: 8.3 g/dL — ABNORMAL LOW (ref 12.0–15.0)
Hemoglobin: 9.4 g/dL — ABNORMAL LOW (ref 12.0–15.0)
MCH: 30.4 pg (ref 26.0–34.0)
MCH: 30.8 pg (ref 26.0–34.0)
MCHC: 33.9 g/dL (ref 30.0–36.0)
MCHC: 34.7 g/dL (ref 30.0–36.0)
MCV: 89.7 fL (ref 80.0–100.0)
MCV: 88.9 fL (ref 80.0–100.0)
Platelets: 101 10*3/uL — ABNORMAL LOW (ref 150–400)
Platelets: 120 10*3/uL — ABNORMAL LOW (ref 150–400)
RBC: 2.73 MIL/uL — ABNORMAL LOW (ref 3.87–5.11)
RBC: 3.05 MIL/uL — ABNORMAL LOW (ref 3.87–5.11)
RDW: 15.5 % (ref 11.5–15.5)
RDW: 15 % (ref 11.5–15.5)
WBC: 36 10*3/uL — ABNORMAL HIGH (ref 4.0–10.5)
WBC: 38.1 10*3/uL — ABNORMAL HIGH (ref 4.0–10.5)
nRBC: 0.2 % (ref 0.0–0.2)
nRBC: 0.3 % — ABNORMAL HIGH (ref 0.0–0.2)

## 2022-02-04 LAB — MRSA NEXT GEN BY PCR, NASAL: MRSA by PCR Next Gen: NOT DETECTED

## 2022-02-04 LAB — BASIC METABOLIC PANEL
Anion gap: 12 (ref 5–15)
BUN: 18 mg/dL (ref 6–20)
CO2: 19 mmol/L — ABNORMAL LOW (ref 22–32)
Calcium: 9.2 mg/dL (ref 8.9–10.3)
Chloride: 104 mmol/L (ref 98–111)
Creatinine, Ser: 0.6 mg/dL (ref 0.44–1.00)
GFR, Estimated: >60 mL/min (ref >60)
Glucose, Bld: 104 mg/dL — ABNORMAL HIGH (ref 70–99)
Potassium: 3.6 mmol/L (ref 3.5–5.1)
Sodium: 135 mmol/L (ref 135–145)

## 2022-02-04 LAB — PREPARE RBC (CROSSMATCH)

## 2022-02-04 LAB — BRAIN NATRIURETIC PEPTIDE: B Natriuretic Peptide: 106.8 pg/mL — ABNORMAL HIGH (ref 0.0–100.0)

## 2022-02-04 LAB — STREP PNEUMONIAE URINARY ANTIGEN: Strep Pneumo Urinary Antigen: NEGATIVE

## 2022-02-04 MED ORDER — ORAL CARE MOUTH RINSE
15.0000 mL | OROMUCOSAL | Status: DC
  Administered 2022-02-05 – 2022-02-07 (×5): 15 mL via OROMUCOSAL

## 2022-02-04 MED ORDER — LORAZEPAM 1 MG PO TABS
1.0000 mg | ORAL_TABLET | Freq: Four times a day (QID) | ORAL | Status: DC | PRN
  Administered 2022-02-04 – 2022-02-06 (×4): 1 mg via ORAL
  Filled 2022-02-04 (×4): qty 1

## 2022-02-04 MED ORDER — ORAL CARE MOUTH RINSE
15.0000 mL | OROMUCOSAL | Status: DC
  Administered 2022-02-04 (×3): 15 mL via OROMUCOSAL

## 2022-02-04 MED ORDER — OXYMETAZOLINE HCL 0.05 % NA SOLN
2.0000 | Freq: Two times a day (BID) | NASAL | Status: AC | PRN
  Filled 2022-02-04: qty 15

## 2022-02-04 MED ORDER — LEVALBUTEROL HCL 0.63 MG/3ML IN NEBU
0.6300 mg | INHALATION_SOLUTION | Freq: Four times a day (QID) | RESPIRATORY_TRACT | Status: DC
  Administered 2022-02-04 – 2022-02-07 (×13): 0.63 mg via RESPIRATORY_TRACT
  Filled 2022-02-04 (×13): qty 3

## 2022-02-04 MED ORDER — CHLORHEXIDINE GLUCONATE CLOTH 2 % EX PADS
6.0000 | MEDICATED_PAD | Freq: Every day | CUTANEOUS | Status: DC
Start: 2022-02-04 — End: 2022-02-08
  Administered 2022-02-04 – 2022-02-06 (×3): 6 via TOPICAL

## 2022-02-04 MED ORDER — PANTOPRAZOLE SODIUM 40 MG IV SOLR
40.0000 mg | Freq: Two times a day (BID) | INTRAVENOUS | Status: DC
Start: 2022-02-04 — End: 2022-02-08
  Administered 2022-02-04 – 2022-02-07 (×8): 40 mg via INTRAVENOUS
  Filled 2022-02-04 (×8): qty 10

## 2022-02-04 MED ORDER — CITALOPRAM HYDROBROMIDE 20 MG PO TABS
40.0000 mg | ORAL_TABLET | Freq: Every day | ORAL | Status: DC
  Administered 2022-02-04 – 2022-02-06 (×2): 40 mg via ORAL
  Filled 2022-02-04 (×2): qty 2

## 2022-02-04 MED ORDER — LORAZEPAM 2 MG/ML IJ SOLN
1.0000 mg | Freq: Once | INTRAMUSCULAR | Status: AC
Start: 2022-02-04 — End: 2022-02-04
  Administered 2022-02-04: 1 mg via INTRAVENOUS
  Filled 2022-02-04: qty 1

## 2022-02-04 MED ORDER — SODIUM CHLORIDE 0.9 % IV SOLN: INTRAVENOUS | Status: DC | PRN

## 2022-02-04 MED ORDER — LEVALBUTEROL HCL 0.63 MG/3ML IN NEBU
0.6300 mg | INHALATION_SOLUTION | Freq: Four times a day (QID) | RESPIRATORY_TRACT | Status: DC | PRN
  Administered 2022-02-04 – 2022-02-05 (×2): 0.63 mg via RESPIRATORY_TRACT
  Filled 2022-02-04 (×2): qty 3

## 2022-02-04 MED ORDER — IPRATROPIUM BROMIDE 0.02 % IN SOLN
0.5000 mg | Freq: Four times a day (QID) | RESPIRATORY_TRACT | Status: DC
  Administered 2022-02-04 – 2022-02-07 (×13): 0.5 mg via RESPIRATORY_TRACT
  Filled 2022-02-04 (×13): qty 2.5

## 2022-02-04 MED ORDER — ARFORMOTEROL TARTRATE 15 MCG/2ML IN NEBU
15.0000 ug | INHALATION_SOLUTION | Freq: Two times a day (BID) | RESPIRATORY_TRACT | Status: DC
  Administered 2022-02-04 – 2022-02-07 (×7): 15 ug via RESPIRATORY_TRACT
  Filled 2022-02-04 (×7): qty 2

## 2022-02-04 MED ORDER — SODIUM CHLORIDE 0.9% IV SOLUTION: Freq: Once | INTRAVENOUS | Status: AC

## 2022-02-04 MED ORDER — TRAZODONE HCL 50 MG PO TABS
50.0000 mg | ORAL_TABLET | Freq: Every day | ORAL | Status: DC
  Administered 2022-02-04 – 2022-02-05 (×2): 50 mg via ORAL
  Filled 2022-02-04 (×2): qty 1

## 2022-02-04 MED ORDER — LEVALBUTEROL HCL 0.63 MG/3ML IN NEBU
0.6300 mg | INHALATION_SOLUTION | RESPIRATORY_TRACT | Status: AC | PRN
  Administered 2022-02-04 (×2): 0.63 mg via RESPIRATORY_TRACT
  Filled 2022-02-04 (×2): qty 3

## 2022-02-04 MED ORDER — GUAIFENESIN-DM 100-10 MG/5ML PO SYRP
5.0000 mL | ORAL_SOLUTION | ORAL | Status: DC | PRN
  Administered 2022-02-04 – 2022-02-05 (×4): 5 mL via ORAL
  Filled 2022-02-04 (×4): qty 10

## 2022-02-04 MED ORDER — ORAL CARE MOUTH RINSE: 15.0000 mL | OROMUCOSAL | Status: DC | PRN

## 2022-02-04 MED ORDER — GUAIFENESIN ER 600 MG PO TB12
600.0000 mg | ORAL_TABLET | Freq: Two times a day (BID) | ORAL | Status: DC
  Administered 2022-02-04 – 2022-02-05 (×3): 600 mg via ORAL
  Filled 2022-02-04 (×4): qty 1

## 2022-02-04 MED ORDER — DEXMEDETOMIDINE HCL IN NACL 200 MCG/50ML IV SOLN
0.4000 ug/kg/h | INTRAVENOUS | Status: DC
  Administered 2022-02-04: 0.4 ug/kg/h via INTRAVENOUS
  Administered 2022-02-05: 0.6 ug/kg/h via INTRAVENOUS
  Administered 2022-02-05: 0.5 ug/kg/h via INTRAVENOUS
  Administered 2022-02-05 (×3): 0.7 ug/kg/h via INTRAVENOUS
  Administered 2022-02-06: 1.2 ug/kg/h via INTRAVENOUS
  Administered 2022-02-06: 0.9 ug/kg/h via INTRAVENOUS
  Administered 2022-02-06 (×4): 1.2 ug/kg/h via INTRAVENOUS
  Administered 2022-02-06: 0.7 ug/kg/h via INTRAVENOUS
  Filled 2022-02-04 (×13): qty 50

## 2022-02-04 MED ORDER — LORAZEPAM 1 MG PO TABS
1.0000 mg | ORAL_TABLET | Freq: Once | ORAL | Status: AC
Start: 2022-02-04 — End: 2022-02-04
  Administered 2022-02-04: 1 mg via ORAL
  Filled 2022-02-04: qty 1

## 2022-02-04 MED ORDER — METHYLPREDNISOLONE SODIUM SUCC 125 MG IJ SOLR
125.0000 mg | Freq: Once | INTRAMUSCULAR | Status: AC
  Administered 2022-02-05: 125 mg via INTRAVENOUS
  Filled 2022-02-04: qty 2

## 2022-02-04 MED ORDER — BUDESONIDE 0.25 MG/2ML IN SUSP
0.2500 mg | Freq: Two times a day (BID) | RESPIRATORY_TRACT | Status: DC
  Administered 2022-02-04 – 2022-02-07 (×7): 0.25 mg via RESPIRATORY_TRACT
  Filled 2022-02-04 (×7): qty 2

## 2022-02-04 MED ORDER — BUPROPION HCL ER (XL) 300 MG PO TB24
300.0000 mg | ORAL_TABLET | Freq: Every day | ORAL | Status: DC
  Administered 2022-02-04 – 2022-02-06 (×2): 300 mg via ORAL
  Filled 2022-02-04 (×2): qty 1

## 2022-02-04 NOTE — Consult Note (Signed)
NAME:  Melanie Ramirez, MRN:  [id number], DOB:  09-05-1980, LOS: 1 ADMISSION DATE:  02/07/2022, CONSULTATION DATE:  02/04/2022  REFERRING MD:  Dr. Tyrell Antonio - [facility], CHIEF COMPLAINT:  Respiratory failure    History of Present Illness:  Melanie Ramirez is a 41 y.o. 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 SOB was associated with tachycardia and an underlying persistent cough.    On ED arrival patient was seen afebrile, tachycardic, tachypneic and hypoxic.  Required application of 55 L high flow O2 via Ventimask for correction of hypoxia.  Lab work significant for sodium 132, alkaline phosphatase 384, albumin 3.1, AST 44, BNP 144.2, lactic acid 2.6, WBC 28, hemoglobin 9.1, platelets 105. Given high oxygen demand in the setting of metastatic cancer PCCM consulted for further management.   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   Significant Hospital Events: Including procedures, antibiotic start and stop dates in addition to other pertinent events   8/14 presented for increased SOB in the setting of metastatic cancer CTA chest on admit with unchanged pulmonary nodularity compatible with know malignancy. Improvement in lymphadenopathy but likely multiple hepatic mets  8/15 PCCM consulted    Interim History / Subjective:  Continues to express dyspnea, speaks in fragment sentences    Objective   Blood pressure 129/85, pulse (!) 122, temperature 98.4 F (36.9 C), temperature source Oral, resp. rate 20, height _0  (1.651 m), weight 63.5 kg, last menstrual period  11/22/2021, SpO2 98 %, unknown if currently breastfeeding.    FiO2 (%):  [50 %-55 %] 50 %   Intake/Output Summary (Last 24 hours) at 02/04/2022 0830 Last data filed at 02/04/2022 0600 Gross per 24 hour  Intake 1563.7 ml  Output 750 ml  Net 813.7 ml   Filed Weights   02/12/2022 1335  Weight: 63.5 kg    Examination: General: Acute ill appearing adult female sitting up in bed in mild respiratory discomfort HEENT: [place]/AT, MM pink/moist, PERRL,  Neuro: Alert and oriented x3, non-focal  CV: s1s2 regular rate and rhythm, no murmur, rubs, or gallops,  PULM:  Course breath sounds bilaterally, increased work of breathing, with accessory muscle use, on venti mask, faint bilaterally expiratory wheeze GI: soft, bowel sounds active in all 4 quadrants, non-tender, non-distended Extremities: warm/dry, no edema  Skin: no rashes or lesions  Resolved Hospital Problem list     Assessment & Plan:  Non-small cell carcinoma (per pathology result 12/05/2021) of unknown primary (likley lung but patient also have ovarian mass) 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  -CTA chest on admit with unchanged pulmonary nodularity compatible with know malignancy. Improvement in lymphadenopathy but likely multiple hepatic mets  Chronic hypoxic respiratory failure on chronic supplemental oxygen at 2L -Currently requiring 55% FIO2 with 12L oxygen via venti-mask  Small left pleural effusion  -Seen on CTA chest, too small for intervention Ovarian mass  -Seen on CT 01/03/2022 measured 8.2 x 5.6, not biopsied originally due to concern for rupture and/or spread of disease, seen by GYN oncology 12/11/21 P: Continue venti mask for SPO2 goal  greater than 92%, wean as able  PRN BIPAP for work of breathing  Head of bed elevated 30 degrees Follow intermittent chest x-ray and ABG  Ensure adequate pulmonary hygiene  Follow cultures and RVP panel  PRN BDs  Continue high dose steroids per oncology   PRN opoid and Benzodiazepines for work of breathing  Reasonable to continue empiric Cefepime for at least 48hrs given presentation and leukocytosis but can stop vancomycin with negative PCR Needs continued goals of care discussion, at this time she would be interested intubation if pulmonary status worsens. Her husband would be her discission Surveyor, minerals (right click and "Reselect all SmartList Selections" daily)   Diet/type: NPO DVT prophylaxis: LMWH GI prophylaxis: PPI Lines: N/A Foley:  N/A Code Status:  full code Last date of multidisciplinary goals of care discussion: Update patient and family daily   Labs   CBC: Recent Labs  Lab 02/13/2022 1349 02/04/22 0540  WBC 28.0* 36.0*  NEUTROABS 21.8*  --   HGB 9.1* 8.3*  HCT 26.6* 24.5*  MCV 88.1 89.7  PLT 105* 101*    Basic Metabolic Panel: Recent Labs  Lab 02/04/2022 1349 02/04/22 0540  NA 132* 135  K 4.3 3.6  CL 100 104  CO2 20* 19*  GLUCOSE 127* 104*  BUN 19 18  CREATININE 0.66 0.60  CALCIUM 9.3 9.2   GFR: Estimated Creatinine Clearance: 85 mL/min (by C-G formula based on SCr of 0.6 mg/dL). Recent Labs  Lab 01/24/2022 1349 02/01/2022 1635 02/04/22 0540  WBC 28.0*  --  36.0*  LATICACIDVEN 2.6* 1.7  --     Liver Function Tests: Recent Labs  Lab 01/21/2022 1349  AST 44*  ALT 26  ALKPHOS 384*  BILITOT 0.3  PROT 7.3  ALBUMIN 3.1*   No results for input(s): "LIPASE", "AMYLASE" in the last 168 hours. No results for input(s): "AMMONIA" in the last 168 hours.  ABG    Component Value Date/Time   PHART 7.47 (H) 01/23/2022 1627   PCO2ART 38 01/23/2022 1627   PO2ART 71 (L) 02/18/2022 1627   HCO3 27.7 02/06/2022 1627   ACIDBASEDEF 0.6 12/05/2021 2005   O2SAT 97.9 01/31/2022 1627     Coagulation Profile: Recent Labs  Lab 02/07/2022 1349  INR 0.9    Cardiac Enzymes: No results for input(s): "CKTOTAL", "CKMB", "CKMBINDEX", "TROPONINI" in the last 168 hours.  HbA1C: No results found for:  "HGBA1C"  CBG: No results for input(s): "GLUCAP" in the last 168 hours.  Review of Systems:   Please see the history of present illness. All other systems reviewed and are negative   Past Medical History:  She,  has a past medical history of Active labor (03/21/2011), Anxiety, Depression, Family history of breast cancer, Family history of colon cancer, Headache(784.0), Pneumonia, and Postpartum care following vaginal delivery (9/28) (03/21/2011).   Surgical History:   Past Surgical History:  Procedure Laterality Date   DENTAL SURGERY     DILATION AND EVACUATION N/A 07/29/2013   Procedure: Ultrasound guided Suction DILATATION AND EVACUATION (D&E) 2ND TRIMESTER with tissue sent for chromosome analysis;  Surgeon: Lovenia Kim, MD;  Location: [facility];  Service: Gynecology;  Laterality: N/A;   FOOT SURGERY Left 2018   calcaneus   VENTRAL HERNIA REPAIR N/A 12/08/2018   Procedure: LAPAROSCOPIC VENTRAL WALL HERNIA REPAIR WITH MESH;  Surgeon: Michael Boston, MD;  Location: [facility];  Service: General;  Laterality: N/A;     Social History:   reports that she has  never smoked. She has never used smokeless tobacco. She reports current alcohol use of about 1.0 standard drink of alcohol per week. She reports that she does not use drugs.   Family History:  Her family history includes Breast cancer in her paternal grandmother and another family member; Colon cancer in her paternal grandmother; Depression in her brother, daughter, father, mother, and sister; Heart disease in her paternal grandfather; Heart failure in her maternal grandfather; Skin cancer in her father and mother.   Allergies Allergies  Allergen Reactions   Oxycodone     Had poor tolerance:unable to sleep &  mild hallucination     Home Medications  Prior to Admission medications   Medication Sig Start Date End Date Taking? Authorizing Provider  acetaminophen (TYLENOL) 500 MG tablet Take 1,000 mg by mouth every 6 (six) hours as  needed for moderate pain or headache.    [provider]  albuterol (VENTOLIN HFA) 108 (90 Base) MCG/ACT inhaler Inhale 2 puffs into the lungs every 4 (four) hours as needed for wheezing or shortness of breath. 12/14/21   Kathie Dike, MD  allopurinol (ZYLOPRIM) 300 MG tablet TAKE 1 TABLET BY MOUTH EVERY DAY 01/24/22   Heath Lark, MD  buPROPion (WELLBUTRIN XL) 300 MG 24 hr tablet Take 300 mg by mouth daily. 11/24/21   [provider]  citalopram (CELEXA) 40 MG tablet Take 40 mg by mouth daily.     [provider]  dexamethasone (DECADRON) 4 MG tablet Take 39m BID. Instructed to taper in late August to 421mdaily as symptoms allow. 01/24/22   SqEppie GibsonMD  dextromethorphan-guaiFENesin ([facility] PartnershipM) 30-600 MG 12hr tablet Take 1 tablet by mouth 2 (two) times daily. 12/14/21   MeKathie DikeMD  famotidine (PEPCID) 20 MG tablet Take 20 mg by mouth 2 (two) times daily.    [provider]  levalbuterol (XPenne Lash0.63 MG/3ML nebulizer solution Take 3 mLs (0.63 mg total) by nebulization every 4 (four) hours as needed for wheezing or shortness of breath. 12/14/21   MeKathie DikeMD  lidocaine (XYLOCAINE) 2 % solution Mix 1 part 2% viscous lidocaine with 1 part water. Swallow 1090mf mixture, 30 minutes before meals and at bedtime, up to 4 times a day. 01/24/22   SquEppie GibsonD  LORazepam (ATIVAN) 1 MG tablet Take 1 tablet (1 mg total) by mouth every 6 (six) hours as needed for anxiety. 01/17/22 01/17/23  GorHeath LarkD  Magnesium 300 MG CAPS Take 300 mg by mouth every evening.    [provider]  morphine (MSIR) 15 MG tablet Take 1 tablet (15 mg total) by mouth every 4 (four) hours as needed for severe pain. 01/17/22   GorHeath LarkD  Multiple Vitamins-Minerals (MULTIVITAMIN WITH MINERALS) tablet Take 1 tablet by mouth daily.    [provider]  ondansetron (ZOFRAN) 8 MG tablet Take 1 tablet (8 mg total) by mouth every 8 (eight) hours as needed for  nausea. 12/13/21   GorHeath LarkD  pantoprazole (PROTONIX) 40 MG tablet Take 1 tablet (40 mg total) by mouth 2 (two) times daily. 01/28/22   GorHeath LarkD  prochlorperazine (COMPAZINE) 10 MG tablet Take 1 tablet (10 mg total) by mouth every 6 (six) hours as needed for nausea or vomiting. 12/13/21   GorHeath LarkD  Sodium Chloride Flush (NORMAL SALINE FLUSH) 0.9 % SOLN Inject 10 mLs into the vein daily. 12/20/21   GorHeath LarkD  Sodium Chloride Flush (NORMAL SALINE FLUSH) 0.9 %  SOLN Please flush 10 ml normal saline daily 01/17/22   Heath Lark, MD  traZODone (DESYREL) 50 MG tablet Take 1 tablet (50 mg total) by mouth at bedtime. 12/14/21   Kathie Dike, MD     Critical care time: 40 min  Sadia Belfiore D. Kenton Kingfisher, NP-C [facility] Personal contact information can be found on Amion  02/04/2022, 8:54 AM

## 2022-02-04 NOTE — Progress Notes (Signed)
Initial Nutrition Assessment  DOCUMENTATION CODES:   Not applicable  INTERVENTION:  - diet advancement as medically feasible.  - complete NFPE when feasible.    NUTRITION DIAGNOSIS:   Increased nutrient needs related to acute illness, chronic illness, cancer and cancer related treatments as evidenced by estimated needs.  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 She presented to the ED due to worsening shortness of breath. Dr. Alvy Bimler recommended high-dose stress dose Solu-Medrol for 3 days, morphine as needed and IV antibiotics to cover for infectious process.  Patient discussed in rounds this AM. Patient has remained on BiPAP since this AM. Patient noted to be sleeping at the time of RD visit this afternoon and no visitors present at bedside. There is a note outside of the room that indicates not to disturb patient.   Patient known to this RD from hospitalization in June.  She has been NPO since admission. Weight yesterday was documented as 140 lb and weight on 7/31 was documented as 143 lb. This indicates 3 lb weight loss (2.1% body weight) in the past 2 weeks.   No information documented in the edema section of flow sheet.    Labs reviewed. Medications reviewed; 125 mg solu-medrol/day 8/14-8/16, 40 mg IV protonix BID.    NUTRITION - FOCUSED PHYSICAL EXAM:  Deferred to follow-up to respect patient's desire to rest.  Diet Order:   Diet Order             Diet NPO time specified Except for: Sips with Meds  Diet effective now                   EDUCATION NEEDS:   No education needs have been identified  at this time  Skin:  Skin Assessment: Reviewed RN Assessment  Last BM:  PTA/unknown  Height:   Ht Readings from Last 1 Encounters:  02/17/2022 '5\' 5"'$  (1.651 m)    Weight:   Wt Readings from Last 1 Encounters:  02/12/2022 63.5 kg     BMI:  Body mass index is 23.3 kg/m.  Estimated Nutritional Needs:  Kcal:  2100-2300 kcal Protein:  105-120 grams Fluid:  >/= 2.3 L/day     Jarome Matin, MS, RD, LDN, CNSC Registered Dietitian II Inpatient Clinical Nutrition RD pager # and on-call/weekend pager # available in [facility]

## 2022-02-04 NOTE — Consult Note (Signed)
Palliative Care Consult Note                                  Date: 02/04/2022   Patient Name: Melanie Ramirez  DOB: 1980/11/16  MRN: [id number]  Age / Sex: 41 y.o., female  PCP: Scheryl Marten, [place] Referring Physician: Elmarie Shiley, MD  Reason for Consultation: {Reason for Consult:23484}  HPI/Patient Profile: 41 y.o. female  with past medical history of *** admitted on 02/13/2022 with ***.   Past Medical History:  Diagnosis Date   Active labor 03/21/2011   Anxiety    Depression    Family history of breast cancer    Family history of colon cancer    Headache(784.0)    occ. migraines with periods   Pneumonia    as teenager   Postpartum care following vaginal delivery (9/28) 03/21/2011    Subjective:   This NP Walden Field reviewed medical records, received report from team, assessed the patient and then meet at the patient's bedside to discuss diagnosis, prognosis, GOC, EOL wishes disposition and options.  I met with ***.   Concept of Palliative Care was introduced as specialized medical care for people and their families living with serious illness.  If focuses on providing relief from the symptoms and stress of a serious illness.  The goal is to improve quality of life for both the patient and the family. Values and goals of care important to patient and family were attempted to be elicited.  Created space and opportunity for patient  and family to explore thoughts and feelings regarding current medical situation   Natural trajectory and current clinical status were discussed. Questions and concerns addressed. Patient  encouraged to call with questions or concerns.    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  SpO2 96%   BMI 23.30 kg/m   Palliative Assessment/Data: ***    Advanced Care Planning:   Primary Decision Maker: {Primary Decision MQKMM:38177}  Code Status/Advance Care Planning: {Palliative Code status:23503}  A discussion was had today regarding advanced directives. Concepts specific to code status, artifical feeding and hydration, continued IV antibiotics and rehospitalization was had.  The difference between a aggressive medical intervention path and a palliative comfort care path for this patient at this time was had. ***The MOST form was introduced and discussed.***  Decisions/Changes to ACP: ***  Assessment & Plan:   Impression: ***  SUMMARY OF RECOMMENDATIONS   ***  Symptom Management:  ***  Prognosis:  {Palliative Care Prognosis:23504}  Discharge Planning:  {Palliative dispostion:23505}   Discussed with: ***    Thank you for allowing [place] to participate in the care of Ivonne Andrew PMT will continue to support holistically.  Time Total: ***  Greater than 50%  of this time was spent counseling and coordinating care related to the above assessment  and plan.  Signed by: Walden Field, NP Palliative Medicine Team  Team Phone # [phone] (Nights/Weekends)  02/04/2022, 4:04 PM

## 2022-02-04 NOTE — Telephone Encounter (Signed)
Patient is currently admitted to hospital. Notified Spouse Theresia Lo) of completion of Disability Form. Fax transmission confirmation received. Copy of Form mailed as requested by Spouse.

## 2022-02-04 NOTE — TOC Progression Note (Signed)
Transition of Care [facility]) - Progression Note    Patient Details  Name: Melanie Ramirez MRN: [id number] Date of Birth: 1981/03/13  [facility] CM/SW Contact  Purcell Mouton, RN Phone Number: 02/04/2022, 8:52 AM  Clinical Narrative:     TOC will continue to follow pt for discharge needs.   Expected Discharge Plan: [place] Barriers to Discharge: No Barriers Identified  Expected Discharge Plan and Services Expected Discharge Plan: [place] Choice: [place] arrangements for the past 2 months: Single Family Home                                       Social Determinants of Health (SDOH) Interventions    Readmission Risk Interventions     No data to display

## 2022-02-04 NOTE — Progress Notes (Signed)
RT NOTE:  Pt placed on BiPAP per pt request. Pt resting comfortably at this time.

## 2022-02-04 NOTE — Progress Notes (Signed)
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  She presented to the ED and was found to be tachycardic heart rate 130s, tachypneic, requiring 55% FiO2 O2 on Venturi mask, she was unable to tolerate high flow nasal cannula.  Presented with leukocytosis.  COVID PCR negative.  CTA chest negative for PE.  Small pericardial effusion.  Extensive pulmonary nodularity confluent masses and interstitial thickening compatible with malignancy unchanged.  Dr. Alvy Bimler /recommended high-dose stress dose Solu-Medrol for 3 days, morphine as needed and IV antibiotics to cover for infectious process.    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 -Continue with IV antibiotics and high-dose IV steroids. -Nebulizer ordered.  -CCM consulted. BIPAP PRN -Plan to proceed with one unit PRBC to help with anemia and tachycardia.  -Check respiratory panel.   2-Small cell carcinoma with Metastasis to both lung and brain.  Patient completed a course of radiation treatment to chest and is due for chemotherapy next week. Follow-up with Dr. Alvy Bimler  Tachycardia: Suspect driven by respiratory process.  Anemia could contributes, plan to proceed with one  unit PRBC>  CTA negative for PE>    Abdominal pain: KUB: Normal bowel gas pattern pattern Report reflux, start PPI BID>   Sepsis: Patient presented with tachycardia, tachypnea, leukocytosis. Suspect related to pneumonia. Continue with IV antibiotics.   Pancytopenia In setting of chemo.  Blood transfusion. Monitor counts.   Leukocytosis, in setting of steroid and recent G-CSF-  Mild hyponatremia.  Monitor.   Ovarian Mass;  Seen on CT scan 714/2023  Follow by GYN 11/2021. Per oncology   Estimated body mass index is 23.3 kg/m as calculated from the following:   Height as of this encounter: '5\' 5"'$  (1.651 m).   Weight as of this encounter: 63.5 kg.   DVT prophylaxis: Lovenox Code Status: Full code Family Communication: Care discussed with patient.  Disposition Plan:  Status is: Inpatient Remains inpatient appropriate because: management of Resp failure.     Consultants:  CCM Oncology   Procedures:    Antimicrobials:  Cefepime  Subjective: She report SOB, cough.  She is anxious.    Objective: Vitals:   02/04/22 0300 02/04/22 0400 02/04/22 0500 02/04/22 0600  BP: 133/74 139/84 130/80 129/85  Pulse: (!) 118 (!) 125 (!) 124 (!) 122  Resp: (!) 22 (!) '30 20 20  '$ Temp:      TempSrc:      SpO2: 99% 97% 100% 100%  Weight:      Height:        Intake/Output Summary (Last 24 hours) at 02/04/2022 0645 Last data filed at 02/04/2022 0600 Gross per 24 hour  Intake 1563.7 ml  Output 750 ml  Net 813.7 ml   Filed Weights   02/02/2022 1335  Weight: 63.5 kg  Examination:  General exam: Appears calm and comfortable  Respiratory system: Tachypnea, Sporadic wheezing.  Cardiovascular system: S1 & S2 heard, RRR.  Gastrointestinal system: Abdomen is nondistended, soft and nontender. No organomegaly or masses felt. Normal bowel sounds heard. Central nervous system: Alert and oriented. No focal neurological deficits. Extremities: Symmetric 5 x 5 power.     Data  Reviewed: I have personally reviewed following labs and imaging studies  CBC: Recent Labs  Lab 02/20/2022 1349 02/04/22 0540  WBC 28.0* 36.0*  NEUTROABS 21.8*  --   HGB 9.1* 8.3*  HCT 26.6* 24.5*  MCV 88.1 89.7  PLT 105* 098*   Basic Metabolic Panel: Recent Labs  Lab 02/02/2022 1349  NA 132*  K 4.3  CL 100  CO2 20*  GLUCOSE 127*  BUN 19  CREATININE 0.66  CALCIUM 9.3   GFR: Estimated Creatinine Clearance: 85 mL/min (by C-G formula based on SCr of 0.66 mg/dL). Liver Function Tests: Recent Labs  Lab 01/26/2022 1349  AST 44*  ALT 26  ALKPHOS 384*  BILITOT 0.3  PROT 7.3  ALBUMIN 3.1*   No results for input(s): "LIPASE", "AMYLASE" in the last 168 hours. No results for input(s): "AMMONIA" in the last 168 hours. Coagulation Profile: Recent Labs  Lab 02/10/2022 1349  INR 0.9   Cardiac Enzymes: No results for input(s): "CKTOTAL", "CKMB", "CKMBINDEX", "TROPONINI" in the last 168 hours. BNP (last 3 results) No results for input(s): "PROBNP" in the last 8760 hours. HbA1C: No results for input(s): "HGBA1C" in the last 72 hours. CBG: No results for input(s): "GLUCAP" in the last 168 hours. Lipid Profile: No results for input(s): "CHOL", "HDL", "LDLCALC", "TRIG", "CHOLHDL", "LDLDIRECT" in the last 72 hours. Thyroid Function Tests: No results for input(s): "TSH", "T4TOTAL", "FREET4", "T3FREE", "THYROIDAB" in the last 72 hours. Anemia Panel: No results for input(s): "VITAMINB12", "FOLATE", "FERRITIN", "TIBC", "IRON", "RETICCTPCT" in the last 72 hours. Sepsis Labs: Recent Labs  Lab 02/20/2022 1349 02/18/2022 1635  LATICACIDVEN 2.6* 1.7    Recent Results (from the past 240 hour(s))  SARS Coronavirus 2 by RT PCR (hospital order, performed in [facility] hospital lab) *cepheid single result test* Anterior Nasal Swab     Status: None   Collection Time: 01/21/2022  3:17 PM   Specimen: Anterior Nasal Swab  Result Value Ref Range Status   SARS Coronavirus 2 by RT PCR NEGATIVE  NEGATIVE Final    Comment: (NOTE) SARS-CoV-2 target nucleic acids are NOT DETECTED.  The SARS-CoV-2 RNA is generally detectable in upper and lower respiratory specimens during the acute phase of infection. The lowest concentration of SARS-CoV-2 viral copies this assay can detect is 250 copies / mL. A negative result does not preclude SARS-CoV-2 infection and should not be used as the sole basis for treatment or other patient management decisions.  A negative result may occur with improper specimen collection / handling, submission of specimen other than nasopharyngeal swab, presence of viral mutation(s) within the areas targeted by this assay, and inadequate number of viral copies (<250 copies / mL). A negative result must be combined with clinical observations, patient history, and epidemiological information.  Fact Sheet for Patients:   https://www.patel.info/  Fact Sheet for Healthcare Providers: https://hall.com/  This test is not yet approved or  cleared by the [place] FDA and has been authorized for detection and/or diagnosis of SARS-CoV-2 by FDA under an Emergency Use Authorization (EUA).  This EUA will remain in effect (meaning this test can be used) for the duration of the  COVID-19 declaration under Section 564(b)(1) of the Act, 21 U.S.C. section 360bbb-3(b)(1), unless the authorization is terminated or revoked sooner.  Performed at [facility], [place] [address], [place], [place] [address]   MRSA Next Gen by PCR, Nasal     Status: None   Collection Time: 02/04/22 12:18 AM   Specimen: Nasal Mucosa; Nasal Swab  Result Value Ref Range Status   MRSA by PCR Next Gen NOT DETECTED NOT DETECTED Final    Comment: (NOTE) The GeneXpert MRSA Assay (FDA approved for NASAL specimens only), is one component of a comprehensive MRSA colonization surveillance program. It is not intended to diagnose MRSA infection nor to  guide or monitor treatment for MRSA infections. Test performance is not FDA approved in patients less than 61 years old. Performed at [facility], [place] [address], [place], [place] [address]          Radiology Studies: DG Abd 1 View  Result Date: 02/01/2022 CLINICAL DATA:  Abdominal pain. EXAM: ABDOMEN - 1 VIEW COMPARISON:  None Available. FINDINGS: The bowel gas pattern is normal. No radio-opaque calculi are seen. Radiopaque contrast is seen within a markedly distended urinary bladder. IMPRESSION: 1. Normal bowel gas pattern. 2. Markedly distended urinary bladder. Electronically Signed   By: Virgina Norfolk M.D.   On: 02/16/2022 22:56   CT Angio Chest PE W and/or Wo Contrast  Result Date: 02/05/2022 CLINICAL DATA:  Increased dyspnea since yesterday, pulmonary embolism suspected EXAM: CT ANGIOGRAPHY CHEST WITH CONTRAST TECHNIQUE: Multidetector CT imaging of the chest was performed using the standard protocol during bolus administration of intravenous contrast. Multiplanar CT image reconstructions and MIPs were obtained to evaluate the vascular anatomy. RADIATION DOSE REDUCTION: This exam was performed according to the departmental dose-optimization program which includes automated exposure control, adjustment of the mA and/or kV according to patient size and/or use of iterative reconstruction technique. CONTRAST:  39m OMNIPAQUE IOHEXOL 350 MG/ML SOLN COMPARISON:  Radiographs earlier today and CT chest 01/14/2022 FINDINGS: Cardiovascular: Satisfactory opacification of the pulmonary arteries to the segmental level. No evidence of pulmonary embolism. Normal heart size. Similar small pericardial effusion. Decreased compression of the SVC. Mediastinum/Nodes: Extensive bilateral mediastinal hilar adenopathy has slightly decreased since 01/14/2022. For example the right paratracheal node measures 2.0 cm, previously 3.2 cm in short axis with decreased compression of the trachea.  Dominant prevascular node measures 2.4 cm, previously 2.9 cm. Similar mass effect on the right middle lobe and right upper lobe bronchi. Unremarkable esophagus. Normal thyroid. Lungs/Pleura: Bilateral pulmonary nodules and confluent masses are diffusely present throughout both lungs not significantly changed. Interstitial thickening in the lung bases favored to represent carcinomatosis. No pleural effusion or pneumothorax. Upper Abdomen: Retrocrural and celiac axis lymphadenopathy. Peripherally enhancing centrally hypoattenuating lesions in the liver incompletely evaluated due to early phase of contrast but compatible with metastases. Musculoskeletal: No osseous metastasis or acute abnormality. Review of the MIP images confirms the above findings. IMPRESSION: 1. Negative for acute pulmonary embolism. 2. Extensive pulmonary nodularity, confluent masses and interstitial thickening compatible with malignancy is unchanged. 3. Interval decrease in mediastinal and hilar lymphadenopathy with decreased compression on the SVC and trachea. 4. Retroperitoneal metastatic lymphadenopathy in the upper abdomen and probable multiple hepatic metastases. Electronically Signed   By: TPlacido SouM.D.   On: 02/16/2022 18:51   DG Chest 2 View  Result Date: 01/25/2022 CLINICAL DATA:  Possible sepsis, increasing shortness of breath EXAM: CHEST - 2 VIEW COMPARISON:  01/13/2022 FINDINGS: Cardiac size is within  normal limits. Central pulmonary vessels are more prominent. There is a diffuse increase in interstitial and alveolar markings in both lungs with possible interval worsening. Lateral CP angles are essentially clear. There is no pneumothorax. Tip of right PICC line is seen in the region of superior vena cava. IMPRESSION: Central pulmonary vessels are prominent suggesting possible CHF. Extensive diffuse increased interstitial and alveolar markings are seen in both lungs with interval worsening. Findings suggest possible pulmonary  edema or multifocal pneumonia. Possibility of underlying neoplastic process and underlying scarring is not excluded. Electronically Signed   By: Elmer Picker M.D.   On: 01/26/2022 14:42        Scheduled Meds:  Chlorhexidine Gluconate Cloth  6 each Topical Daily   enoxaparin (LOVENOX) injection  40 mg Subcutaneous Q24H   levalbuterol  0.63 mg Nebulization Q6H   methylPREDNISolone (SOLU-MEDROL) injection  125 mg Intravenous Daily   mouth rinse  15 mL Mouth Rinse 4 times per day   Continuous Infusions:  sodium chloride 75 mL/hr at 02/04/22 0600   sodium chloride Stopped (02/04/22 0540)   ceFEPime (MAXIPIME) IV Stopped (02/04/22 0114)   vancomycin Stopped (02/04/22 0410)     LOS: 1 day    Time spent: 35 minutes.     Elmarie Shiley, MD [facility]   If 7PM-7AM, please contact night-coverage [email or web address]  02/04/2022, 6:45 AM

## 2022-02-04 NOTE — Progress Notes (Signed)
Melanie Ramirez   DOB:03-25-1981   WU#:[id number]    ASSESSMENT & PLAN:  Metastatic lung cancer She is completed a course of radiation treatment and is due for chemotherapy next week Continue supportive care  Respiratory failure, mild pleuritic chest pain CTA chest performed which is negative for PE, unchanged pulmonary nodules, decrease in mediastinal and hilar lymphadenopathy She has been started on Solu-Medrol for 3 days as well as as needed morphine sulfate to help with her pain and increased work of breathing Pulmonology has consulted  Leukocytosis Could be due to infection versus recent G-CSF versus recent dexamethasone Agree with broad-spectrum coverage until infection is ruled out  Mild pancytopenia Due to chemotherapy She does not need transfusion support  Mild anxiety As needed morphine as needed  Goals of care discussions Resolution of worsening shortness of breath  Discharge planning I anticipate she will be here over the next few days  Mikey Bussing, NP 02/04/2022 10:09 AM  Subjective:  The patient has ongoing difficulty breathing Has not been able to rest very well today and just fell asleep prior to my visit No other new concerns No family at bedside  Objective:  Vitals:   02/04/22 0851 02/04/22 0852  BP:    Pulse:    Resp:    Temp:    SpO2: 99% 96%     Intake/Output Summary (Last 24 hours) at 02/04/2022 1009 Last data filed at 02/04/2022 0600 Gross per 24 hour  Intake 1563.7 ml  Output 750 ml  Net 813.7 ml    GENERAL:alert.  She has oxygen delivered via nonrebreathing mask.  She had has increased work of breathing  NEURO: alert & oriented x 3 with fluent speech, no focal motor/sensory deficits   Labs:  Recent Labs    12/03/21 2346 12/04/21 0500 01/03/22 1426 01/14/22 0814 02/15/2022 1349 02/04/22 0540  NA  --    < > 136 132* 132* 135  K  --    < > 3.8 3.6 4.3 3.6  CL  --    < > 104 101 100 104  CO2  --    < > 25 20* 20* 19*  GLUCOSE   --    < > 98 172* 127* 104*  BUN  --    < > 25* 28* 19 18  CREATININE  --    < > 0.62 0.73 0.66 0.60  CALCIUM  --    < > 8.7* 9.3 9.3 9.2  GFRNONAA  --    < > >60 >60 >60 >60  PROT 7.4   < > 6.9 7.0 7.3  --   ALBUMIN 3.3*   < > 3.7 3.9 3.1*  --   AST 46*   < > 18 46* 44*  --   ALT 28   < > '14 23 26  '$ --   ALKPHOS 107   < > 97 170* 384*  --   BILITOT 0.5   < > 0.3 0.3 0.3  --   BILIDIR <0.1  --   --   --   --   --   IBILI NOT CALCULATED  --   --   --   --   --    < > = values in this interval not displayed.    Studies: I personally reviewed her chest x-ray DG Abd 1 View  Result Date: 01/23/2022 CLINICAL DATA:  Abdominal pain. EXAM: ABDOMEN - 1 VIEW COMPARISON:  None Available. FINDINGS: The bowel gas pattern is normal. No  radio-opaque calculi are seen. Radiopaque contrast is seen within a markedly distended urinary bladder. IMPRESSION: 1. Normal bowel gas pattern. 2. Markedly distended urinary bladder. Electronically Signed   By: Virgina Norfolk M.D.   On: 02/17/2022 22:56   CT Angio Chest PE W and/or Wo Contrast  Result Date: 02/02/2022 CLINICAL DATA:  Increased dyspnea since yesterday, pulmonary embolism suspected EXAM: CT ANGIOGRAPHY CHEST WITH CONTRAST TECHNIQUE: Multidetector CT imaging of the chest was performed using the standard protocol during bolus administration of intravenous contrast. Multiplanar CT image reconstructions and MIPs were obtained to evaluate the vascular anatomy. RADIATION DOSE REDUCTION: This exam was performed according to the departmental dose-optimization program which includes automated exposure control, adjustment of the mA and/or kV according to patient size and/or use of iterative reconstruction technique. CONTRAST:  74m OMNIPAQUE IOHEXOL 350 MG/ML SOLN COMPARISON:  Radiographs earlier today and CT chest 01/14/2022 FINDINGS: Cardiovascular: Satisfactory opacification of the pulmonary arteries to the segmental level. No evidence of pulmonary embolism. Normal  heart size. Similar small pericardial effusion. Decreased compression of the SVC. Mediastinum/Nodes: Extensive bilateral mediastinal hilar adenopathy has slightly decreased since 01/14/2022. For example the right paratracheal node measures 2.0 cm, previously 3.2 cm in short axis with decreased compression of the trachea. Dominant prevascular node measures 2.4 cm, previously 2.9 cm. Similar mass effect on the right middle lobe and right upper lobe bronchi. Unremarkable esophagus. Normal thyroid. Lungs/Pleura: Bilateral pulmonary nodules and confluent masses are diffusely present throughout both lungs not significantly changed. Interstitial thickening in the lung bases favored to represent carcinomatosis. No pleural effusion or pneumothorax. Upper Abdomen: Retrocrural and celiac axis lymphadenopathy. Peripherally enhancing centrally hypoattenuating lesions in the liver incompletely evaluated due to early phase of contrast but compatible with metastases. Musculoskeletal: No osseous metastasis or acute abnormality. Review of the MIP images confirms the above findings. IMPRESSION: 1. Negative for acute pulmonary embolism. 2. Extensive pulmonary nodularity, confluent masses and interstitial thickening compatible with malignancy is unchanged. 3. Interval decrease in mediastinal and hilar lymphadenopathy with decreased compression on the SVC and trachea. 4. Retroperitoneal metastatic lymphadenopathy in the upper abdomen and probable multiple hepatic metastases. Electronically Signed   By: TPlacido SouM.D.   On: 02/01/2022 18:51   DG Chest 2 View  Result Date: 02/19/2022 CLINICAL DATA:  Possible sepsis, increasing shortness of breath EXAM: CHEST - 2 VIEW COMPARISON:  01/13/2022 FINDINGS: Cardiac size is within normal limits. Central pulmonary vessels are more prominent. There is a diffuse increase in interstitial and alveolar markings in both lungs with possible interval worsening. Lateral CP angles are essentially  clear. There is no pneumothorax. Tip of right PICC line is seen in the region of superior vena cava. IMPRESSION: Central pulmonary vessels are prominent suggesting possible CHF. Extensive diffuse increased interstitial and alveolar markings are seen in both lungs with interval worsening. Findings suggest possible pulmonary edema or multifocal pneumonia. Possibility of underlying neoplastic process and underlying scarring is not excluded. Electronically Signed   By: PElmer PickerM.D.   On: 02/15/2022 14:42   CT Angio Chest Pulmonary Embolism (PE) W or WO Contrast  Result Date: 01/14/2022 CLINICAL DATA:  Pulmonary embolus suspected with high probability. EXAM: CT ANGIOGRAPHY CHEST WITH CONTRAST TECHNIQUE: Multidetector CT imaging of the chest was performed using the standard protocol during bolus administration of intravenous contrast. Multiplanar CT image reconstructions and MIPs were obtained to evaluate the vascular anatomy. RADIATION DOSE REDUCTION: This exam was performed according to the departmental dose-optimization program which includes automated exposure control, adjustment of  the mA and/or kV according to patient size and/or use of iterative reconstruction technique. CONTRAST:  69m OMNIPAQUE IOHEXOL 350 MG/ML SOLN COMPARISON:  01/02/2022 FINDINGS: Cardiovascular: Good opacification of the central and segmental pulmonary arteries. No focal filling defects. No evidence of significant pulmonary embolus. Normal heart size. Small pericardial effusion. Normal caliber thoracic aorta. No aortic dissection. Great vessel origins are patent. A right central venous catheter is present with tip at the lower SVC. The superior vena cava is extrinsically compressed and is diminutive in caliber. This represents a change since previous study. Mediastinum/Nodes: Extensive lymphadenopathy throughout the mediastinum and hilar regions. Right paratracheal lymph nodes measure up to about 4.7 cm short axis dimension.  Prominent lymph nodes extending along the pulmonary arteries and bronchi. Right hilar/suprahilar mass lesion measuring up to about 4.6 cm diameter. This is causing extrinsic compression of right middle lobe and right upper lobe bronchi. Esophagus is decompressed. Thyroid gland is unremarkable. Lungs/Pleura: Multiple pulmonary nodules and confluent masses demonstrated throughout. Nodules are mostly in a perihilar distribution. Interstitial changes in the lung bases may represent interstitial carcinomatosis. No pleural effusions. No pneumothorax. Upper Abdomen: Retrocrural and celiac axis lymphadenopathy. Heterogeneous parenchymal pattern to the liver. This is incompletely evaluated due to early phase of contrast but likely metastatic. Musculoskeletal: No bone metastasis are demonstrated. Review of the MIP images confirms the above findings. IMPRESSION: 1. No evidence of significant pulmonary embolus. 2. Extensive malignancy demonstrated throughout the chest. Prominent lymphadenopathy throughout the mediastinum, hilar, and peribronchovascular regions. Right hilar/suprahilar mass lesion. 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 3. Lymphadenopathy causes new extrinsic compression of the SVC resulting in SVC obstruction. 4. Metastatic lymphadenopathy in the celiac axis of the upper abdomen and probably in the liver. 5. Extrinsic compression of right middle lobe and right upper lobe bronchi with postobstructive changes. Electronically Signed   By: WLucienne CapersM.D.   On: 01/14/2022 17:15   DG Chest 2 View  Result Date: 01/14/2022 CLINICAL DATA:  metastatic cancer to lungs worsening SOB EXAM: CHEST - 2 VIEW COMPARISON:  Chest CT 01/02/2022, radiograph 12/10/2021 FINDINGS: Right upper extremity catheter tip overlies the distal superior vena cava. Unchanged cardiomediastinal silhouette with known lymphadenopathy. There are unchanged nodular and  reticular opacities bilaterally consistent with known metastatic disease. No pleural effusion. No pneumothorax. Bones are unchanged. IMPRESSION: Stable findings of pulmonary metastatic disease and mediastinal lymphadenopathy. No new acute findings. Electronically Signed   By: JMaurine SimmeringM.D.   On: 01/14/2022 08:02

## 2022-02-05 DIAGNOSIS — Z7189 Other specified counseling: Secondary | ICD-10-CM | POA: Diagnosis not present

## 2022-02-05 DIAGNOSIS — J9621 Acute and chronic respiratory failure with hypoxia: Secondary | ICD-10-CM | POA: Diagnosis not present

## 2022-02-05 DIAGNOSIS — Z515 Encounter for palliative care: Secondary | ICD-10-CM | POA: Diagnosis not present

## 2022-02-05 DIAGNOSIS — R0602 Shortness of breath: Secondary | ICD-10-CM | POA: Diagnosis not present

## 2022-02-05 DIAGNOSIS — C801 Malignant (primary) neoplasm, unspecified: Secondary | ICD-10-CM

## 2022-02-05 LAB — CBC
HCT: 30 % — ABNORMAL LOW (ref 36.0–46.0)
Hemoglobin: 10.2 g/dL — ABNORMAL LOW (ref 12.0–15.0)
MCH: 30.6 pg (ref 26.0–34.0)
MCHC: 34 g/dL (ref 30.0–36.0)
MCV: 90.1 fL (ref 80.0–100.0)
Platelets: 197 10*3/uL (ref 150–400)
RBC: 3.33 MIL/uL — ABNORMAL LOW (ref 3.87–5.11)
RDW: 15.2 % (ref 11.5–15.5)
WBC: 43.1 10*3/uL — ABNORMAL HIGH (ref 4.0–10.5)
nRBC: 0.6 % — ABNORMAL HIGH (ref 0.0–0.2)

## 2022-02-05 LAB — RESPIRATORY PANEL BY PCR

## 2022-02-05 LAB — BPAM RBC
Blood Product Expiration Date: 202309122359
ISSUE DATE / TIME: 202308151621
Unit Type and Rh: 5100

## 2022-02-05 LAB — TYPE AND SCREEN
ABO/RH(D): O POS
Antibody Screen: NEGATIVE
Unit division: 0

## 2022-02-05 LAB — BASIC METABOLIC PANEL
Anion gap: 8 (ref 5–15)
BUN: 22 mg/dL — ABNORMAL HIGH (ref 6–20)
CO2: 23 mmol/L (ref 22–32)
Calcium: 10 mg/dL (ref 8.9–10.3)
Chloride: 107 mmol/L (ref 98–111)
Creatinine, Ser: 0.76 mg/dL (ref 0.44–1.00)
GFR, Estimated: >60 mL/min (ref >60)
Glucose, Bld: 80 mg/dL (ref 70–99)
Potassium: 4.4 mmol/L (ref 3.5–5.1)
Sodium: 138 mmol/L (ref 135–145)

## 2022-02-05 LAB — LEGIONELLA PNEUMOPHILA SEROGP 1 UR AG: L. pneumophila Serogp 1 Ur Ag: NEGATIVE

## 2022-02-05 LAB — EXPECTORATED SPUTUM ASSESSMENT W GRAM STAIN, RFLX TO RESP C

## 2022-02-05 MED ORDER — MORPHINE SULFATE (PF) 2 MG/ML IV SOLN
1.0000 mg | INTRAVENOUS | Status: DC | PRN
  Administered 2022-02-05 – 2022-02-06 (×10): 2 mg via INTRAVENOUS
  Filled 2022-02-05 (×10): qty 1

## 2022-02-05 MED ORDER — MORPHINE SULFATE 4 MG/ML IJ SOLN: 1.0000 mg | INTRAMUSCULAR | Status: DC | PRN

## 2022-02-05 MED ORDER — MORPHINE SULFATE 4 MG/ML IJ SOLN: 2.0000 mg | INTRAMUSCULAR | Status: DC | PRN

## 2022-02-05 NOTE — Progress Notes (Signed)
NAME:  Melanie Ramirez, MRN:  [id number], DOB:  [birth year], LOS: 2 ADMISSION DATE:  02/09/2022, CONSULTATION DATE:  02/04/2022  REFERRING MD:  Dr. Tyrell Antonio - [facility], CHIEF COMPLAINT:  Respiratory failure    History of Present Illness:  Melanie Ramirez is a 41 y.o. 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 SOB was associated with tachycardia and an underlying persistent cough.    On ED arrival patient was seen afebrile, tachycardic, tachypneic and hypoxic.  Required application of 55 L high flow O2 via Ventimask for correction of hypoxia.  Lab work significant for sodium 132, alkaline phosphatase 384, albumin 3.1, AST 44, BNP 144.2, lactic acid 2.6, WBC 28, hemoglobin 9.1, platelets 105. Given high oxygen demand in the setting of metastatic cancer PCCM consulted for further management.   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   Significant Hospital Events: Including procedures, antibiotic start and stop dates in addition to other pertinent events   8/14 presented for increased SOB in the setting of metastatic cancer CTA chest on admit with unchanged pulmonary nodularity compatible with know malignancy. Improvement in lymphadenopathy but likely multiple hepatic mets  8/15 PCCM consulted   8/16 Required low dose Precedex drip overnight with BIPAP for work of breathing, continues to expressed severe subjective dyspnea   Interim History / Subjective:  Slightly sleepy this am on Precedex drip   Objective   Blood pressure 126/80, pulse (!) 109, temperature 98.4 F  (36.9 C), temperature source Axillary, resp. rate 20, height _0  (1.651 m), weight 62.6 kg, last menstrual period 11/22/2021, SpO2 97 %, unknown if currently breastfeeding.    FiO2 (%):  [40 %-100 %] 100 %   Intake/Output Summary (Last 24 hours) at 02/05/2022 0725 Last data filed at 02/05/2022 0600 Gross per 24 hour  Intake 755.63 ml  Output 950 ml  Net -194.37 ml    Filed Weights   01/29/2022 1335 02/04/22 0030  Weight: 63.5 kg 62.6 kg    Examination: General: Acute on chronically ill appearing adult female lying in bed, in NAD HEENT: [place]/AT, BIPAP in place MM pink/moist, PERRL,  Neuro: Alert and oriented this am, slightly sleepy on Precedex drip  CV: s1s2 regular rate and rhythm, no murmur, rubs, or gallops,  PULM:  Severe diffuse course breath sounds bilaterally, on BIPAP this am, accessory muscle use  GI: soft, bowel sounds active in all 4 quadrants, non-tender, non-distended Extremities: warm/dry, no edema  Skin: no rashes or lesions  Resolved Hospital Problem list     Assessment & Plan:  Non-small cell carcinoma (per pathology result 12/05/2021) of unknown primary (likley lung but patient also have ovarian mass) 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  -CTA chest on admit with unchanged pulmonary nodularity compatible with know malignancy. Improvement in lymphadenopathy but likely multiple hepatic mets  Acute on chronic hypoxic respiratory failure on chronic supplemental oxygen at 2L -Currently requiring 55% FIO2 with 12L oxygen via venti-mask  Small left pleural effusion  -Seen on CTA chest, too small for intervention P: Remains at high risk for intubation, given  overall clinical picture very high concern that if patient becomes intubated it will be very difficult to extubate At time time patient and spouse continue to wish for aggressive interventions   Continue supplemental oxygen for sat goal greater than 92 PRN BIPAP for work of breathing   Follow intermittent CXR and ABG PRN BDs Continue high dose steroids  Severe anxiety component, continue low dose Precedex and PRN opoids  Continue empiric Cefepime  Palliative care now following for assistance in symptom management   Tachycardia  -Likely driven by work of breathing and anxiety  P: Continuous telemetry   Leucocytosis  Pancytopenia  -In the setting of chemo and high dose steroids  P: Continue to monitor CBC daily   Ovarian mass  -Seen on CT 01/03/2022 measured 8.2 x 5.6, not biopsied originally due to concern for rupture and/or spread of disease, seen by GYN oncology 12/11/21 P: Supportive care   Best Practice (right click and "Reselect all SmartList Selections" daily)   Diet/type: NPO DVT prophylaxis: LMWH GI prophylaxis: PPI Lines: N/A Foley:  N/A Code Status:  full code Last date of multidisciplinary goals of care discussion: Update patient and family daily   Critical care time: 40 min  Ottie Tillery D. Kenton Kingfisher, NP-C [facility] Personal contact information can be found on Amion  02/05/2022, 7:25 AM

## 2022-02-05 NOTE — Progress Notes (Signed)
Patient had episode of increased work of breathing after "break from bipap." Patient had increased anxiety as well. HR 130s-140s, RR 40s. Patient required dose of morphine and placed back on bipap by RT shortly after. Patient's work of breathing and anxiety has decreased.

## 2022-02-05 NOTE — Progress Notes (Signed)
Daily Progress Note   Patient Name: Melanie Ramirez       Date: 02/05/2022 DOB: 10-15-1980  Age: 41 y.o. MRN#: [id number] Attending Physician: Melanie [place] Melanie Ewald J, DO Primary Care Physician: Melanie Ramirez, [place] Admit Date: 02/16/2022 Length of Stay: 2 days  Reason for Consultation/Follow-up: Establishing goals of care  HPI/Patient Profile:  41 y.o. female  with past medical history of non-small cell carcinoma (path 12/05/2021) of unknown primary (likely lung but patient also has ovarian mass) with metastasis to lung, liver, brain now s/p radiation/chemotherapy, chronic hypoxic respiratory failure on supplemental oxygen 2L [place] baseline, GERD who presented to [facility] ED on 8/15 for progressive shortness of breath, associated with tachycardia and cough.   PMT was consulted for [place] conversations.  Subjective:   Subjective: Chart Reviewed. Updates received. Patient Assessed. Created space and opportunity for patient  and family to explore thoughts and feelings regarding current medical situation.  Today's Discussion: Today this patient was sleeping soundly so I elected to not wake her. I spoke with her husband Melanie Ramirez, reiterated our continued support of the patient and her family as she's walking this journey. Melanie Ramirez elaborated more on the patient's relationship with her father. She asked for help from him and he refused which both made things more difficult but also emotionally hurt her. He is happy that she's accepting medication to help with anxiety and breathing. He states he has a good support system and knowing she's wel taken care of here has allowed him to get better sleep.   We talked a little about his daughters and their fun at a pool party last night. This discussion seemed to bring some needed relief, comfort, happiness to him.  I explained I am off the next few days but we have someone here available to see them if needed, though may not check in every day.  I provided emotional and general  support through therapeutic listening, empathy, sharing of stories, and other techniques. I answered all questions and addressed all concerns to the best of my ability.  Review of Systems  Unable to perform ROS: Other  (Patient sleeping, elected to not wake her)  Objective:   Vital Signs:  BP 117/77   Pulse (!) 132   Temp 98.2 F (36.8 C) (Axillary)   Resp (!) 25   Ht _0  (1.651 m)   Wt 62.6 kg   LMP 11/22/2021 (Exact Date) Comment: pt's husband has had a vasectomy.  SpO2 97%   BMI 22.97 kg/m   Physical Exam: Physical Exam Vitals and nursing note reviewed.  Constitutional:      General: She is sleeping. She is not in acute distress.    Appearance: She is ill-appearing.  HENT:     Head: Normocephalic and atraumatic.  Pulmonary:     Effort: Pulmonary effort is normal. No respiratory distress.     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)  Pancytopenia, acquired ([place])  SUMMARY OF RECOMMENDATIONS   Continue full code Full scope of treatment Continue medications for anxiety and dyspnea PMT will continue to follow peripherally and as needed for continued patient and family support Please notify us of any significant changes or new needs  Referral placed for outpatient palliative care in the embedded [facility] clinic with Melanie Lea, NP  Symptom Management:  Per primary team PMT is available to assist as  needed  Code Status: Full code  Prognosis: Unable to determine  Discharge Planning: To Be Determined  Discussed with: Medical team, nursing team, patient's husband  Thank you for allowing [place] to participate in the care of Melanie Ramirez PMT will continue to support holistically.  Billing based on MDM: High  Problems Addressed: One acute or chronic illness or injury that poses a threat to  life or bodily function  Amount and/or Complexity of Data: Category 1:Review of prior external note(s) from each unique source and Review of the result(s) of each unique test and Category 3:Discussion of management or test interpretation with external physician/other qualified health care professional/appropriate source (not separately reported) (todays labs for cell counts; met panel for kidney function/electrolytes, recent imaging, recent oncology notes)  Risks: Parenteral controlled substances   Melanie Field, NP Palliative Medicine Team  Team Phone # [phone] (Nights/Weekends)  02/19/2021, 8:17 AM

## 2022-02-05 NOTE — TOC Progression Note (Signed)
Transition of Care [facility]) - Progression Note    Patient Details  Name: Melanie Ramirez MRN: [id number] Date of Birth: [birth year]  [facility] CM/SW Contact  Shantavia Jha, Juliann Pulse, RN Phone Number: 02/05/2022, 10:30 AM  Clinical Narrative: spoke to spouse about d/c plans-Has rw,home 02-[employer],has travel tank. On Bipap-[employer] rep Zach following if needed @ home. PT cons-await recc.      Expected Discharge Plan: [place] Barriers to Discharge: No Barriers Identified  Expected Discharge Plan and Services Expected Discharge Plan: [place] Choice: [place] arrangements for the past 2 months: Single Family Home                                       Social Determinants of Health (SDOH) Interventions    Readmission Risk Interventions     No data to display

## 2022-02-05 NOTE — Progress Notes (Signed)
Patient attempted to come off BIPAP and drink some sips of water.  Patient became very short of breath and was only able to tolerate the NRB for a couple minutes.  Once going back on the BIPAP she was not able to tolerate it either.  Stated that she felt like she was fighting against it and asked for meds to help her relax.  Nurse has given her something and she is back on the BIPAP at this time.  O2 sats 97%

## 2022-02-05 NOTE — Progress Notes (Signed)
Due to increased WOB, patient placed on Heated High Flow [place] 90%, 25L. Patient states it feels good and her WOB has decreased.  RT will continue to monitor.

## 2022-02-05 NOTE — Progress Notes (Signed)
PROGRESS NOTE    Melanie Ramirez  QQI:[id number] DOB: Sep 24, 1980 DOA: 02/05/2022 PCP: Scheryl Marten, PA    Brief Narrative:   Melanie Ramirez is a 41 y.o. 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  In the ED, patient was noted to be tachycardic with HR in the 130s, tachypneic requiring 55% FiO2 via Venturi mask and unable to tolerate high flow nasal cannula.  Notable leukocytosis, COVID PCR negative.  CTA chest negative for PE but notable for small pericardial effusion, extensive pulmonary nodularity, confluent masses and interstitial thickening compatible with malignancy that is relatively unchanged.  Medical oncology, Dr. Alvy Bimler was consulted and recommended high-dose Solu-Medrol x3 days and IV antibiotics to cover for infectious process.  PCCM was consulted given patient's high O2 requirements.  Patient was admitted to the hospital service for acute on chronic hypoxic respiratory failure in the setting of known metastatic malignancy and immunocompromise state.  Assessment & Plan:   Acute on chronic hypoxic respiratory failure, POA Sepsis, POA Patient presenting with tachycardia, tachypnea, leukocytosis, and elevated lactic acid likely related to pneumonia in the setting of immunocompromise state with recent chemotherapy/radiation.  MRSA PCR negative, vancomycin now discontinued.  Urinalysis unrevealing.  CT angiogram chest negative for acute pulmonary embolism, but notable for extensive pulmonary nodularity, confluent masses interstitial thickening compatible with malignancy which is unchanged, interval decrease  mediastinal/hilar lymphadenopathy with decreased compression of the SVC and trachea, retroperitoneal metastatic lymphadenopathy in the upper abdomen and probable multiple hepatic metastasis. 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 Alvy Bimler.  Completed radiation treatment, currently on chemotherapy. --Medical oncology following, appreciate assistance --Overall, unfortunately very poor prognosis; palliative care consulted for assistance with goals of care given advanced metastatic cancer  Tachycardia Eat etiology likely secondary to underlying respiratory distress.  See CT angiogram chest negative for pulmonary embolism.  Also consideration of anemia is contributing factor and patient received 1 unit PRBC; as well as severe anxiety with respiratory distress. --Continue monitor on telemetry  Anxiety --Lorazepam 1 mg p.o. every 6 hours as needed --Morphine 1-2 mg IV every 2 hours as needed  Leukocytosis Patient presenting with a WBC count of 28.0.  Now uptrending likely secondary to WBC demarginalization in setting of IV steroid use. --WBC 28.0>>38.1>43.1 --Continue antibiotics as above --CBC daily  GERD: Protonix 40 mg IV q12h     DVT prophylaxis: enoxaparin (LOVENOX) injection 40 mg Start: 02/12/2022 2300    Code Status: Full Code Family Communication: No family present at bedside this morning  Disposition Plan:  Level of care: Stepdown Status is: Inpatient Remains inpatient appropriate because: Continues to require BiPAP, Precedex drip overnight, critically ill    Consultants:   [place] oncology, Dr. Alvy Bimler Palliative care: Consult pending  Procedures:  None  Antimicrobials:  Vancomycin 8/14 - 8/14 Cefepime 8/14>>   Subjective: Patient seen and examined at bedside, resting comfortably.  Currently on BiPAP and Precedex drip.  RN present; reports patient becomes anxious with tachycardia once BiPAP removed.  Currently on BiPAP with FiO2 40% with SPO2 97%.  Discussed with PCCM this morning.  Overall, fortunately very poor prognosis and awaiting palliative care consultation.  Continues on IV steroids and antibiotics.  Other than shortness of breath, patient with no other complaints at this time.  Denies chest pain, no abdominal pain, no nausea/vomiting.  No acute concerns overnight per nursing staff.  Objective: Vitals:   02/05/22 0800 02/05/22 0814 02/05/22 0844 02/05/22 1000  BP: 118/73   120/62  Pulse: (!) 108 (!) 106 (!) 111 (!) 119  Resp: (!) 21 (!) 21 (!) 21 (!) 24  Temp: 99.3 F (37.4 C)     TempSrc: Axillary     SpO2: 97% 97% 97% 93%  Weight:      Height:        Intake/Output Summary (Last 24 hours) at 02/05/2022 1045 Last data filed at 02/05/2022 0900 Gross per 24 hour  Intake 908.48 ml  Output 950 ml  Net -41.52 ml   Filed Weights   02/02/2022 1335 02/04/22 0030  Weight: 63.5 kg 62.6 kg    Examination:  Physical Exam: GEN: NAD, alert, chronically ill in appearance, appears older than stated age HEENT: NCAT, PERRL, EOMI, sclera clear, MMM PULM: Coarse breath sounds bilaterally, slightly diminished bilateral bases, no wheezing, normal respiratory effort without accessory muscle use, on BiPAP with FiO2 40% CV: RRR w/o M/G/R GI: abd soft, NTND, NABS, no R/G/M MSK: Trace bilateral lower extremity edema to mid shin, moves all extremities independently NEURO: CN II-XII intact, no focal deficits, sensation to light touch intact PSYCH: Depressed mood/flat affect Integumentary: dry/intact, no rashes or wounds    Data Reviewed: I have  personally reviewed following labs and imaging studies  CBC: Recent Labs  Lab 02/15/2022 1349 02/04/22 0540 02/04/22 2108 02/05/22 0530  WBC 28.0* 36.0* 38.1* 43.1*  NEUTROABS 21.8*  --   --   --   HGB 9.1* 8.3* 9.4* 10.2*  HCT 26.6* 24.5* 27.1* 30.0*  MCV 88.1 89.7 88.9 90.1  PLT 105* 101* 120* 850   Basic Metabolic Panel: Recent Labs  Lab 02/19/2022 1349 02/04/22 0540 02/05/22 0530  NA 132* 135 138  K 4.3 3.6 4.4  CL 100 104 107  CO2 20* 19* 23  GLUCOSE 127* 104* 80  BUN 19 18 22*  CREATININE 0.66 0.60 0.76  CALCIUM 9.3 9.2 10.0   GFR: Estimated Creatinine Clearance: 85 mL/min (by C-G formula based on SCr of 0.76 mg/dL). Liver Function Tests: Recent Labs  Lab 02/10/2022 1349  AST 44*  ALT 26  ALKPHOS 384*  BILITOT 0.3  PROT 7.3  ALBUMIN 3.1*   No results for input(s): "LIPASE", "AMYLASE" in the last 168 hours. No results for input(s): "AMMONIA" in the last 168 hours. Coagulation Profile: Recent Labs  Lab 01/22/2022 1349  INR 0.9   Cardiac Enzymes: No results for input(s): "CKTOTAL", "CKMB", "CKMBINDEX", "TROPONINI" in the last 168 hours. BNP (last 3 results) No results for input(s): "PROBNP" in the last 8760 hours. HbA1C: No results for input(s): "HGBA1C" in the last 72 hours. CBG: No results for input(s): "GLUCAP" in the last 168 hours. Lipid Profile: No results for input(s): "CHOL", "HDL", "LDLCALC", "TRIG", "CHOLHDL", "LDLDIRECT" in the last 72 hours. Thyroid Function Tests: No results for input(s): "TSH", "T4TOTAL", "FREET4", "T3FREE", "THYROIDAB" in the last 72 hours. Anemia Panel: No results for input(s): "VITAMINB12", "FOLATE", "FERRITIN", "TIBC", "  IRON", "RETICCTPCT" in the last 72 hours. Sepsis Labs: Recent Labs  Lab 02/04/2022 1349 02/10/2022 1635  LATICACIDVEN 2.6* 1.7    Recent Results (from the past 240 hour(s))  Culture, blood (Routine x 2)     Status: None (Preliminary result)   Collection Time: 02/15/2022  1:25 PM   Specimen: BLOOD   Result Value Ref Range Status   Specimen Description   Final    BLOOD BLOOD LEFT FOREARM Performed at [facility] [address], [place], [place] [address]    Special Requests   Final    BOTTLES DRAWN AEROBIC AND ANAEROBIC Blood Culture adequate volume Performed at [facility] [address], [place], [place] [address]    Culture   Final    NO GROWTH 2 DAYS Performed at [facility] [address], [place], [place] [address]    Report Status PENDING  Incomplete  Culture, blood (Routine x 2)     Status: None (Preliminary result)   Collection Time: 02/17/2022  1:50 PM   Specimen: BLOOD  Result Value Ref Range Status   Specimen Description   Final    BLOOD BLOOD LEFT HAND Performed at [facility] [address], [place], [place] [address]    Special Requests   Final    BOTTLES DRAWN AEROBIC AND ANAEROBIC Blood Culture results may not be optimal due to an inadequate volume of blood received in culture bottles Performed at [facility] [address] [place], [place] [address]    Culture   Final    NO GROWTH 2 DAYS Performed at [facility] [address] [place], [place] [address]    Report Status PENDING  Incomplete  SARS Coronavirus 2 by RT PCR (hospital order, performed in [facility] hospital lab) *cepheid single result test* Anterior Nasal Swab     Status: None   Collection Time: 01/29/2022  3:17 PM   Specimen: Anterior Nasal Swab  Result Value Ref Range Status   SARS Coronavirus 2 by RT PCR NEGATIVE NEGATIVE Final    Comment: (NOTE) SARS-CoV-2 target nucleic acids are NOT DETECTED.  The SARS-CoV-2 RNA is generally detectable in upper and lower respiratory specimens during the acute phase of infection. The lowest concentration of SARS-CoV-2 viral copies this assay can detect is 250 copies / mL. A negative result does not preclude SARS-CoV-2 infection and should not be used as  the sole basis for treatment or other patient management decisions.  A negative result may occur with improper specimen collection / handling, submission of specimen other than nasopharyngeal swab, presence of viral mutation(s) within the areas targeted by this assay, and inadequate number of viral copies (<250 copies / mL). A negative result must be combined with clinical observations, patient history, and epidemiological information.  Fact Sheet for Patients:   https://www.patel.info/  Fact Sheet for Healthcare Providers: https://hall.com/  This test is not yet approved or  cleared by the [place] FDA and has been authorized for detection and/or diagnosis of SARS-CoV-2 by FDA under an Emergency Use Authorization (EUA).  This EUA will remain in effect (meaning this test can be used) for the duration of the COVID-19 declaration under Section 564(b)(1) of the Act, 21 U.S.C. section 360bbb-3(b)(1), unless the authorization is terminated or revoked sooner.  Performed at [facility], [place] [address] [place],  [address]   MRSA Next Gen by PCR, Nasal     Status: None  Collection Time: 02/04/22 12:18 AM   Specimen: Nasal Mucosa; Nasal Swab  Result Value Ref Range Status   MRSA by PCR Next Gen NOT DETECTED NOT DETECTED Final    Comment: (NOTE) The GeneXpert MRSA Assay (FDA approved for NASAL specimens only), is one component of a comprehensive MRSA colonization surveillance program. It is not intended to diagnose MRSA infection nor to guide or monitor treatment for MRSA infections. Test performance is not FDA approved in patients less than 75 years old. Performed at [facility], [place] [address], [place], [place] [address]          Radiology Studies: DG Abd 1 View  Result Date: 01/22/2022 CLINICAL DATA:  Abdominal pain. EXAM: ABDOMEN - 1 VIEW COMPARISON:  None Available. FINDINGS: The  bowel gas pattern is normal. No radio-opaque calculi are seen. Radiopaque contrast is seen within a markedly distended urinary bladder. IMPRESSION: 1. Normal bowel gas pattern. 2. Markedly distended urinary bladder. Electronically Signed   By: Virgina Norfolk M.D.   On: 02/07/2022 22:56   CT Angio Chest PE W and/or Wo Contrast  Result Date: 02/13/2022 CLINICAL DATA:  Increased dyspnea since yesterday, pulmonary embolism suspected EXAM: CT ANGIOGRAPHY CHEST WITH CONTRAST TECHNIQUE: Multidetector CT imaging of the chest was performed using the standard protocol during bolus administration of intravenous contrast. Multiplanar CT image reconstructions and MIPs were obtained to evaluate the vascular anatomy. RADIATION DOSE REDUCTION: This exam was performed according to the departmental dose-optimization program which includes automated exposure control, adjustment of the mA and/or kV according to patient size and/or use of iterative reconstruction technique. CONTRAST:  15m OMNIPAQUE IOHEXOL 350 MG/ML SOLN COMPARISON:  Radiographs earlier today and CT chest 01/14/2022 FINDINGS: Cardiovascular: Satisfactory opacification of the pulmonary arteries to the segmental level. No evidence of pulmonary embolism. Normal heart size. Similar small pericardial effusion. Decreased compression of the SVC. Mediastinum/Nodes: Extensive bilateral mediastinal hilar adenopathy has slightly decreased since 01/14/2022. For example the right paratracheal node measures 2.0 cm, previously 3.2 cm in short axis with decreased compression of the trachea. Dominant prevascular node measures 2.4 cm, previously 2.9 cm. Similar mass effect on the right middle lobe and right upper lobe bronchi. Unremarkable esophagus. Normal thyroid. Lungs/Pleura: Bilateral pulmonary nodules and confluent masses are diffusely present throughout both lungs not significantly changed. Interstitial thickening in the lung bases favored to represent carcinomatosis. No  pleural effusion or pneumothorax. Upper Abdomen: Retrocrural and celiac axis lymphadenopathy. Peripherally enhancing centrally hypoattenuating lesions in the liver incompletely evaluated due to early phase of contrast but compatible with metastases. Musculoskeletal: No osseous metastasis or acute abnormality. Review of the MIP images confirms the above findings. IMPRESSION: 1. Negative for acute pulmonary embolism. 2. Extensive pulmonary nodularity, confluent masses and interstitial thickening compatible with malignancy is unchanged. 3. Interval decrease in mediastinal and hilar lymphadenopathy with decreased compression on the SVC and trachea. 4. Retroperitoneal metastatic lymphadenopathy in the upper abdomen and probable multiple hepatic metastases. Electronically Signed   By: TPlacido SouM.D.   On: 02/17/2022 18:51   DG Chest 2 View  Result Date: 02/12/2022 CLINICAL DATA:  Possible sepsis, increasing shortness of breath EXAM: CHEST - 2 VIEW COMPARISON:  01/13/2022 FINDINGS: Cardiac size is within normal limits. Central pulmonary vessels are more prominent. There is a diffuse increase in interstitial and alveolar markings in both lungs with possible interval worsening. Lateral CP angles are essentially clear. There is no pneumothorax. Tip of right PICC line is seen in the region of superior vena cava. IMPRESSION:  Central pulmonary vessels are prominent suggesting possible CHF. Extensive diffuse increased interstitial and alveolar markings are seen in both lungs with interval worsening. Findings suggest possible pulmonary edema or multifocal pneumonia. Possibility of underlying neoplastic process and underlying scarring is not excluded. Electronically Signed   By: Elmer Picker M.D.   On: 02/17/2022 14:42        Scheduled Meds:  arformoterol  15 mcg Nebulization BID   budesonide (PULMICORT) nebulizer solution  0.25 mg Nebulization BID   buPROPion  300 mg Oral Daily   Chlorhexidine Gluconate  Cloth  6 each Topical Daily   citalopram  40 mg Oral Daily   enoxaparin (LOVENOX) injection  40 mg Subcutaneous Q24H   guaiFENesin  600 mg Oral BID   ipratropium  0.5 mg Nebulization Q6H   levalbuterol  0.63 mg Nebulization Q6H   mouth rinse  15 mL Mouth Rinse 4 times per day   pantoprazole (PROTONIX) IV  40 mg Intravenous Q12H   traZODone  50 mg Oral QHS   Continuous Infusions:  sodium chloride 10 mL/hr at 02/05/22 0900   ceFEPime (MAXIPIME) IV Stopped (02/05/22 0840)   dexmedetomidine (PRECEDEX) IV infusion 0.7 mcg/kg/hr (02/05/22 0900)     LOS: 2 days    Critical Care Time Upon my evaluation, this patient had a high probability of imminent or life-threatening deterioration due to acute on chronic hypoxic respiratory failure requiring BiPAP and Precedex, which required my direct attention, intervention, and personal management.  I have personally provided 49 minutes of critical care time exclusive of my time spent on separately billable procedures.  Time includes review of laboratory data, radiology results, discussion with consultants, and monitoring for potential decompensation.       Kaliel Bolds J [place] DO [facility] Available via Epic secure chat 7am-7pm After these hours, please refer to coverage provider listed on [email or web address] 02/05/2022, 10:45 AM

## 2022-02-06 ENCOUNTER — Encounter: Payer: Self-pay | Admitting: Hematology and Oncology

## 2022-02-06 ENCOUNTER — Ambulatory Visit: Payer: Self-pay | Admitting: Genetic Counselor

## 2022-02-06 ENCOUNTER — Inpatient Hospital Stay (HOSPITAL_COMMUNITY): Payer: BC Managed Care – PPO

## 2022-02-06 ENCOUNTER — Telehealth: Payer: Self-pay | Admitting: Genetic Counselor

## 2022-02-06 DIAGNOSIS — C7801 Secondary malignant neoplasm of right lung: Secondary | ICD-10-CM | POA: Diagnosis not present

## 2022-02-06 DIAGNOSIS — C7802 Secondary malignant neoplasm of left lung: Secondary | ICD-10-CM | POA: Diagnosis not present

## 2022-02-06 DIAGNOSIS — J9621 Acute and chronic respiratory failure with hypoxia: Secondary | ICD-10-CM | POA: Diagnosis not present

## 2022-02-06 DIAGNOSIS — Z1379 Encounter for other screening for genetic and chromosomal anomalies: Secondary | ICD-10-CM

## 2022-02-06 LAB — CBC
HCT: 27.5 % — ABNORMAL LOW (ref 36.0–46.0)
Hemoglobin: 9.3 g/dL — ABNORMAL LOW (ref 12.0–15.0)
MCH: 30.7 pg (ref 26.0–34.0)
MCHC: 33.8 g/dL (ref 30.0–36.0)
MCV: 90.8 fL (ref 80.0–100.0)
Platelets: 140 10*3/uL — ABNORMAL LOW (ref 150–400)
RBC: 3.03 MIL/uL — ABNORMAL LOW (ref 3.87–5.11)
RDW: 15.5 % (ref 11.5–15.5)
WBC: 44.7 10*3/uL — ABNORMAL HIGH (ref 4.0–10.5)
nRBC: 0.2 % (ref 0.0–0.2)

## 2022-02-06 LAB — BASIC METABOLIC PANEL
Anion gap: 8 (ref 5–15)
BUN: 23 mg/dL — ABNORMAL HIGH (ref 6–20)
CO2: 23 mmol/L (ref 22–32)
Calcium: 10.4 mg/dL — ABNORMAL HIGH (ref 8.9–10.3)
Chloride: 106 mmol/L (ref 98–111)
Creatinine, Ser: 0.77 mg/dL (ref 0.44–1.00)
GFR, Estimated: >60 mL/min (ref >60)
Glucose, Bld: 78 mg/dL (ref 70–99)
Potassium: 3.6 mmol/L (ref 3.5–5.1)
Sodium: 137 mmol/L (ref 135–145)

## 2022-02-06 MED ORDER — ETOMIDATE 2 MG/ML IV SOLN
INTRAVENOUS | Status: AC
  Administered 2022-02-06: 20 mg
  Filled 2022-02-06: qty 20

## 2022-02-06 MED ORDER — PROPOFOL 1000 MG/100ML IV EMUL
0.0000 ug/kg/min | INTRAVENOUS | Status: DC
  Administered 2022-02-07: 40 ug/kg/min via INTRAVENOUS
  Administered 2022-02-07: 30 ug/kg/min via INTRAVENOUS
  Administered 2022-02-07: 40 ug/kg/min via INTRAVENOUS
  Filled 2022-02-06 (×4): qty 100

## 2022-02-06 MED ORDER — DOCUSATE SODIUM 50 MG/5ML PO LIQD
100.0000 mg | Freq: Two times a day (BID) | ORAL | Status: DC
Start: 2022-02-07 — End: 2022-02-08

## 2022-02-06 MED ORDER — PROPOFOL 1000 MG/100ML IV EMUL
INTRAVENOUS | Status: AC
  Administered 2022-02-06: 20 ug/kg/min via INTRAVENOUS
  Filled 2022-02-06: qty 100

## 2022-02-06 MED ORDER — METOPROLOL TARTRATE 5 MG/5ML IV SOLN
2.5000 mg | INTRAVENOUS | Status: DC | PRN
  Administered 2022-02-06 (×2): 2.5 mg via INTRAVENOUS
  Filled 2022-02-06 (×2): qty 5

## 2022-02-06 MED ORDER — FENTANYL CITRATE PF 50 MCG/ML IJ SOSY: 50.0000 ug | PREFILLED_SYRINGE | INTRAMUSCULAR | Status: DC | PRN

## 2022-02-06 MED ORDER — FENTANYL CITRATE PF 50 MCG/ML IJ SOSY
50.0000 ug | PREFILLED_SYRINGE | INTRAMUSCULAR | Status: DC | PRN
  Administered 2022-02-07: 200 ug via INTRAVENOUS
  Administered 2022-02-07: 100 ug via INTRAVENOUS
  Administered 2022-02-07: 200 ug via INTRAVENOUS
  Administered 2022-02-07: 100 ug via INTRAVENOUS
  Administered 2022-02-07: 50 ug via INTRAVENOUS
  Administered 2022-02-07: 200 ug via INTRAVENOUS
  Filled 2022-02-06: qty 4
  Filled 2022-02-06: qty 2
  Filled 2022-02-06: qty 4
  Filled 2022-02-06 (×2): qty 2
  Filled 2022-02-06: qty 4

## 2022-02-06 MED ORDER — SUCCINYLCHOLINE CHLORIDE 200 MG/10ML IV SOSY
PREFILLED_SYRINGE | INTRAVENOUS | Status: AC
  Filled 2022-02-06: qty 10

## 2022-02-06 MED ORDER — FENTANYL CITRATE (PF) 100 MCG/2ML IJ SOLN
INTRAMUSCULAR | Status: AC
  Filled 2022-02-06: qty 2

## 2022-02-06 MED ORDER — ROCURONIUM BROMIDE 10 MG/ML (PF) SYRINGE
PREFILLED_SYRINGE | INTRAVENOUS | Status: AC
  Administered 2022-02-06: 80 mg
  Filled 2022-02-06: qty 10

## 2022-02-06 MED ORDER — POLYETHYLENE GLYCOL 3350 17 G PO PACK
17.0000 g | PACK | Freq: Every day | ORAL | Status: DC
Start: 2022-02-07 — End: 2022-02-08

## 2022-02-06 MED ORDER — FENTANYL CITRATE PF 50 MCG/ML IJ SOSY
PREFILLED_SYRINGE | INTRAMUSCULAR | Status: AC
  Filled 2022-02-06: qty 2

## 2022-02-06 MED ORDER — LORAZEPAM 2 MG/ML IJ SOLN
1.0000 mg | Freq: Four times a day (QID) | INTRAMUSCULAR | Status: DC | PRN
  Administered 2022-02-06: 1 mg via INTRAVENOUS
  Filled 2022-02-06: qty 1

## 2022-02-06 MED ORDER — METOPROLOL TARTRATE 5 MG/5ML IV SOLN
2.5000 mg | Freq: Once | INTRAVENOUS | Status: AC
  Administered 2022-02-06: 2.5 mg via INTRAVENOUS
  Filled 2022-02-06: qty 5

## 2022-02-06 MED ORDER — MIDAZOLAM HCL 2 MG/2ML IJ SOLN
INTRAMUSCULAR | Status: AC
  Administered 2022-02-06: 2 mg
  Filled 2022-02-06: qty 2

## 2022-02-06 NOTE — Procedures (Signed)
Intubation Procedure Note  Melanie Ramirez  [id number]  1980/12/15  Date:02/06/22  Time:11:41 PM   Provider Performing:Yaphet Smethurst M Verlee Monte    Procedure: Intubation (96222)  Indication(s) Respiratory Failure  Consent Unable to obtain consent due to emergent nature of procedure.   Anesthesia Etomidate, Versed, and Rocuronium   Time Out Verified patient identification, verified procedure, site/side was marked, verified correct patient position, special equipment/implants available, medications/allergies/relevant history reviewed, required imaging and test results available.   Sterile Technique Usual hand hygeine, masks, and gloves were used   Procedure Description Patient positioned in bed supine.  Sedation given as noted above.  Patient was intubated with endotracheal tube using Glidescope.  View was Grade 1 full glottis .  Number of attempts was 1.  Colorimetric CO2 detector was consistent with tracheal placement.   Complications/Tolerance None; patient tolerated the procedure well. Chest X-ray is ordered to verify placement.   EBL Minimal   Specimen(s) None

## 2022-02-06 NOTE — Progress Notes (Signed)
Pt having trouble breathing , tachy, HR 150-160. Bleeding from nose and mouth. NP notified.

## 2022-02-06 NOTE — Progress Notes (Signed)
Pharmacy Antibiotic Note  Melanie Ramirez is a 41 y.o. female admitted on 01/23/2022 with leukocytosis/sepsis.  PMH significant for metastatic lung cancer, recently completing XRT with plans to begin chemotherapy.  Pharmacy remains consulted for Cefepime dosing.  Plan: Continue Cefepime 2gm IV q8h Dosage remains stable at 2g IV q8 hr and further renal adjustments will be performed per institutional Pharmacy antibiotic protocol.   Pharmacy will sign off, following peripherally for culture results or dose adjustments. Please reconsult if a change in clinical status warrants re-evaluation of dosage.  Height: '5\' 5"'$  (165.1 cm) Weight: 62.6 kg (138 lb 0.1 oz) IBW/kg (Calculated) : 57  Temp (24hrs), Avg:97.9 F (36.6 C), Min:97.4 F (36.3 C), Max:98.4 F (36.9 C)  Recent Labs  Lab 01/23/2022 1349 02/12/2022 1635 02/04/22 0540 02/04/22 2108 02/05/22 0530 02/06/22 0519  WBC 28.0*  --  36.0* 38.1* 43.1* 44.7*  CREATININE 0.66  --  0.60  --  0.76 0.77  LATICACIDVEN 2.6* 1.7  --   --   --   --      Estimated Creatinine Clearance: 85 mL/min (by C-G formula based on SCr of 0.77 mg/dL).    Allergies  Allergen Reactions   Lactose Intolerance (Gi) Other (See Comments)    Causes stomach upset and too much mucus   Oxycodone Other (See Comments)    Had poor tolerance unable to sleep & mild hallucination    Antimicrobials this admission: 8/14 Cefepime >>   8/14 Vancomycin >>  8/15    Microbiology results: 8/14 BCx:  NGTD    Thank you for allowing pharmacy to be a part of this patient's care.  Royetta Asal, PharmD, BCPS Clinical Pharmacist [place] Please utilize Amion for appropriate phone number to reach the unit pharmacist ([place]) 02/06/2022 10:55 AM

## 2022-02-06 NOTE — Progress Notes (Signed)
PT Cancellation Note  Patient Details Name: Melanie Ramirez MRN: [id number] DOB: [birth year]   Cancelled Treatment:    Reason Eval/Treat Not Completed: Medical issues which prohibited therapy, patient on BiPAP and Precedex. Will follow .  [place] Office [phone] Weekend pager-[phone]    Claretha Cooper 02/06/2022, 9:26 AM

## 2022-02-06 NOTE — Progress Notes (Signed)
PROGRESS NOTE    Melanie Ramirez  RKY:[id number] DOB: February 05, 1981 DOA: 02/09/2022 PCP: Scheryl Marten, PA    Brief Narrative:   Melanie Ramirez is a 41 y.o. 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  In the ED, patient was noted to be tachycardic with HR in the 130s, tachypneic requiring 55% FiO2 via Venturi mask and unable to tolerate high flow nasal cannula.  Notable leukocytosis, COVID PCR negative.  CTA chest negative for PE but notable for small pericardial effusion, extensive pulmonary nodularity, confluent masses and interstitial thickening compatible with malignancy that is relatively unchanged.  Medical oncology, Dr. Alvy Bimler was consulted and recommended high-dose Solu-Medrol x3 days and IV antibiotics to cover for infectious process.  PCCM was consulted given patient's high O2 requirements.  Patient was admitted to the hospital service for acute on chronic hypoxic respiratory failure in the setting of known metastatic malignancy and immunocompromise state.  Assessment & Plan:   Acute on chronic hypoxic respiratory failure, POA Sepsis, POA Patient presenting with tachycardia, tachypnea, leukocytosis, and elevated lactic acid likely related to pneumonia in the setting of immunocompromise state with recent chemotherapy/radiation.  MRSA PCR negative, vancomycin now discontinued.  Urinalysis unrevealing.  CT angiogram chest negative for acute pulmonary embolism, but notable for extensive pulmonary nodularity, confluent masses interstitial thickening compatible with malignancy which is unchanged, interval decrease  mediastinal/hilar lymphadenopathy with decreased compression of the SVC and trachea, retroperitoneal metastatic lymphadenopathy in the upper abdomen and probable multiple hepatic metastasis.  Respiratory viral panel negative.  MRSA PCR negative.  Status post Solu-Medrol 125 mg IV daily x 3 days. 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 Alvy Bimler.  Completed radiation treatment, currently on chemotherapy. --Medical oncology following, appreciate assistance --Overall, unfortunately very poor prognosis; palliative care consulted for assistance with goals of care given advanced metastatic cancer  Tachycardia Eat etiology likely secondary to underlying respiratory distress.  See CT angiogram chest negative for pulmonary embolism.  Also consideration of anemia is contributing factor and patient received 1 unit PRBC; as well as severe anxiety with respiratory distress. --Continue monitor on telemetry  Anxiety --Lorazepam 1 mg p.o. every 6 hours as needed --Morphine 1-2 mg IV every 2 hours as needed  Leukocytosis Patient presenting with a WBC count of 28.0.  Now uptrending likely secondary to WBC demarginalization in setting of IV steroid use vs recent G-CSF administration. --WBC 28.0>>38.1>43.1> --Continue antibiotics as above --CBC daily  GERD: Protonix 40 mg IV q12h   DVT prophylaxis: enoxaparin (LOVENOX) injection 40 mg Start: 01/22/2022 2300   Code Status: Full Code Family Communication: No family present at bedside this morning  Disposition Plan:  Level of care: Stepdown Status is: Inpatient Remains  inpatient appropriate because: Continues to require BiPAP, Precedex drip, critically ill with very poor prognosis    Consultants:  PCCM Medical oncology, Dr. Alvy Bimler Palliative care  Procedures:  None  Antimicrobials:  Vancomycin 8/14 - 8/14 Cefepime 8/14>>   Subjective: Patient seen and examined at bedside, ill in appearance.  Currently on BiPAP and somnolent.  Continues on Precedex infusion.  Was replaced on BiPAP early this morning for respiratory distress and tachycardia.  Discussed with PCCM this morning.  Overall, fortunately very poor prognosis; seen by palliative care yesterday and patient/family wish to continue aggressive treatment.  Completed 3-day course of high-dose IV steroids yesterday.  Patient mumbles to questions, poorly responsive; unable to obtain any further ROS from patient this morning.  No family present at bedside.  No other acute concerns this morning per RN.  Objective: Vitals:   02/06/22 0835 02/06/22 0904 02/06/22 1000 02/06/22 1045  BP: 122/71 (!) 88/53 123/68   Pulse: (!) 109 (!) 117 (!) 125 (!) 152  Resp: (!) 23 (!) 23 (!) 33 (!) 35  Temp:      TempSrc:      SpO2: 98% 100% 93% 92%  Weight:      Height:        Intake/Output Summary (Last 24 hours) at 02/06/2022 1053 Last data filed at 02/06/2022 1052 Gross per 24 hour  Intake 844.06 ml  Output 800 ml  Net 44.06 ml   Filed Weights   02/06/2022 1335 02/04/22 0030  Weight: 63.5 kg 62.6 kg    Examination:  Physical Exam: GEN: NAD, alert, chronically/critically ill in appearance, appears older than stated age HEENT: NCAT, PERRL, EOMI, sclera clear, MMM PULM: Coarse breath sounds bilaterally, slightly diminished bilateral bases, no wheezing, tachypneic but without accessory muscle use, on BiPAP with FiO2 100% CV: RRR w/o M/G/R GI: abd soft, NTND, NABS, no R/G/M MSK: Trace bilateral lower extremity edema to mid shin, moves all extremities independently NEURO: CN II-XII intact, no focal  deficits, sensation to light touch intact PSYCH: Depressed mood/flat affect Integumentary: dry/intact, no rashes or wounds    Data Reviewed: I have personally reviewed following labs and imaging studies  CBC: Recent Labs  Lab 02/02/2022 1349 02/04/22 0540 02/04/22 2108 02/05/22 0530 02/06/22 0519  WBC 28.0* 36.0* 38.1* 43.1* 44.7*  NEUTROABS 21.8*  --   --   --   --   HGB 9.1* 8.3* 9.4* 10.2* 9.3*  HCT 26.6* 24.5* 27.1* 30.0* 27.5*  MCV 88.1 89.7 88.9 90.1 90.8  PLT 105* 101* 120* 197 161*   Basic Metabolic Panel: Recent Labs  Lab 01/31/2022 1349 02/04/22 0540 02/05/22 0530 02/06/22 0519  NA 132* 135 138 137  K 4.3 3.6 4.4 3.6  CL 100 104 107 106  CO2 20* 19* 23 23  GLUCOSE 127* 104* 80 78  BUN 19 18 22* 23*  CREATININE 0.66 0.60 0.76 0.77  CALCIUM 9.3 9.2 10.0 10.4*   GFR: Estimated Creatinine Clearance: 85 mL/min (by C-G formula based on SCr of 0.77 mg/dL). Liver Function Tests: Recent Labs  Lab 02/01/2022 1349  AST 44*  ALT 26  ALKPHOS 384*  BILITOT 0.3  PROT 7.3  ALBUMIN 3.1*   No results for input(s): "LIPASE", "AMYLASE" in the last 168 hours. No results for input(s): "AMMONIA" in the last 168 hours. Coagulation Profile: Recent Labs  Lab 02/15/2022 1349  INR 0.9   Cardiac Enzymes: No results for input(s): "CKTOTAL", "CKMB", "CKMBINDEX", "TROPONINI" in the last 168 hours. BNP (last 3 results) No results for input(s): "PROBNP" in the last 8760 hours. HbA1C: No results for input(s): "HGBA1C" in the last 72 hours. CBG: No results for input(s): "GLUCAP" in the last 168 hours. Lipid Profile: No  results for input(s): "CHOL", "HDL", "LDLCALC", "TRIG", "CHOLHDL", "LDLDIRECT" in the last 72 hours. Thyroid Function Tests: No results for input(s): "TSH", "T4TOTAL", "FREET4", "T3FREE", "THYROIDAB" in the last 72 hours. Anemia Panel: No results for input(s): "VITAMINB12", "FOLATE", "FERRITIN", "TIBC", "IRON", "RETICCTPCT" in the last 72 hours. Sepsis  Labs: Recent Labs  Lab 02/02/2022 1349 01/21/2022 1635  LATICACIDVEN 2.6* 1.7    Recent Results (from the past 240 hour(s))  Culture, blood (Routine x 2)     Status: None (Preliminary result)   Collection Time: 02/06/2022  1:25 PM   Specimen: BLOOD  Result Value Ref Range Status   Specimen Description   Final    BLOOD BLOOD LEFT FOREARM Performed at [facility] [address], [place], [place] [address]    Special Requests   Final    BOTTLES DRAWN AEROBIC AND ANAEROBIC Blood Culture adequate volume Performed at [facility] [address] [place], [place] [address]    Culture   Final    NO GROWTH 3 DAYS Performed at [facility], [place] [address] [place], [place] [address]    Report Status PENDING  Incomplete  Culture, blood (Routine x 2)     Status: None (Preliminary result)   Collection Time: 02/02/2022  1:50 PM   Specimen: BLOOD  Result Value Ref Range Status   Specimen Description   Final    BLOOD BLOOD LEFT HAND Performed at [facility] [address], [place], [place] [address]    Special Requests   Final    BOTTLES DRAWN AEROBIC AND ANAEROBIC Blood Culture results may not be optimal due to an inadequate volume of blood received in culture bottles Performed at [facility] [address], [place], [place] [address]    Culture   Final    NO GROWTH 3 DAYS Performed at [facility], [place] [address] [place], [place] [address]    Report Status PENDING  Incomplete  SARS Coronavirus 2 by RT PCR (hospital order, performed in [facility] hospital lab) *cepheid single result test* Anterior Nasal Swab     Status: None   Collection Time: 02/05/2022  3:17 PM   Specimen: Anterior Nasal Swab  Result Value Ref Range Status   SARS Coronavirus 2 by RT PCR NEGATIVE NEGATIVE Final    Comment: (NOTE) SARS-CoV-2 target nucleic acids are NOT DETECTED.  The SARS-CoV-2 RNA is generally detectable in  upper and lower respiratory specimens during the acute phase of infection. The lowest concentration of SARS-CoV-2 viral copies this assay can detect is 250 copies / mL. A negative result does not preclude SARS-CoV-2 infection and should not be used as the sole basis for treatment or other patient management decisions.  A negative result may occur with improper specimen collection / handling, submission of specimen other than nasopharyngeal swab, presence of viral mutation(s) within the areas targeted by this assay, and inadequate number of viral copies (<250 copies / mL). A negative result must be combined with clinical observations, patient history, and epidemiological information.  Fact Sheet for Patients:   https://www.patel.info/  Fact Sheet for Healthcare Providers: https://hall.com/  This test is not yet approved or  cleared by the [place] FDA and has been authorized for detection and/or diagnosis of SARS-CoV-2 by FDA under an Emergency Use Authorization (EUA).  This EUA will remain in effect (meaning this test can be used) for the duration of the COVID-19 declaration under Section 564(b)(1) of the Act,  21 U.S.C. section 360bbb-3(b)(1), unless the authorization is terminated or revoked sooner.  Performed at [facility], [place] [address], [place], [place] [address]   MRSA Next Gen by PCR, Nasal     Status: None   Collection Time: 02/04/22 12:18 AM   Specimen: Nasal Mucosa; Nasal Swab  Result Value Ref Range Status   MRSA by PCR Next Gen NOT DETECTED NOT DETECTED Final    Comment: (NOTE) The GeneXpert MRSA Assay (FDA approved for NASAL specimens only), is one component of a comprehensive MRSA colonization surveillance program. It is not intended to diagnose MRSA infection nor to guide or monitor treatment for MRSA infections. Test performance is not FDA approved in patients less than 47 years old. Performed  at [facility], [place] [address], [place], [place] [address]   Respiratory (~20 pathogens) panel by PCR     Status: None   Collection Time: 02/05/22  9:14 AM   Specimen: Nasopharyngeal Swab; Respiratory  Result Value Ref Range Status   Adenovirus NOT DETECTED NOT DETECTED Final   Coronavirus 229E NOT DETECTED NOT DETECTED Final    Comment: (NOTE) The Coronavirus on the Respiratory Panel, DOES NOT test for the novel  Coronavirus (2019 nCoV)    Coronavirus HKU1 NOT DETECTED NOT DETECTED Final   Coronavirus NL63 NOT DETECTED NOT DETECTED Final   Coronavirus OC43 NOT DETECTED NOT DETECTED Final   Metapneumovirus NOT DETECTED NOT DETECTED Final   Rhinovirus / Enterovirus NOT DETECTED NOT DETECTED Final   Influenza A NOT DETECTED NOT DETECTED Final   Influenza B NOT DETECTED NOT DETECTED Final   Parainfluenza Virus 1 NOT DETECTED NOT DETECTED Final   Parainfluenza Virus 2 NOT DETECTED NOT DETECTED Final   Parainfluenza Virus 3 NOT DETECTED NOT DETECTED Final   Parainfluenza Virus 4 NOT DETECTED NOT DETECTED Final   Respiratory Syncytial Virus NOT DETECTED NOT DETECTED Final   Bordetella pertussis NOT DETECTED NOT DETECTED Final   Bordetella Parapertussis NOT DETECTED NOT DETECTED Final   Chlamydophila pneumoniae NOT DETECTED NOT DETECTED Final   Mycoplasma pneumoniae NOT DETECTED NOT DETECTED Final    Comment: Performed at [facility], [place]. [address] [place], [place] [address]  Expectorated Sputum Assessment w Gram Stain, Rflx to Resp Cult     Status: None   Collection Time: 02/05/22  7:26 PM   Specimen: Sputum  Result Value Ref Range Status   Specimen Description SPUTUM  Final   Special Requests NONE  Final   Sputum evaluation   Final    THIS SPECIMEN IS ACCEPTABLE FOR SPUTUM CULTURE Performed at [facility], [place] [address], [place], [place] [address]    Report Status 02/05/2022 FINAL  Final  Culture, Respiratory w Gram Stain      Status: None (Preliminary result)   Collection Time: 02/05/22  7:26 PM   Specimen: SPU  Result Value Ref Range Status   Specimen Description   Final    SPUTUM Performed at [facility] [address], [place], [place] [address]    Special Requests   Final    NONE Reflexed from [id number] Performed at [facility], [place] [address], [place],  [address]    Gram Stain   Final    FEW WBC PRESENT, PREDOMINANTLY PMN FEW GRAM POSITIVE RODS    Culture   Final    TOO YOUNG TO READ Performed at [facility], [place] [address], [place],  [address]  Report Status PENDING  Incomplete         Radiology Studies: No results found.      Scheduled Meds:  arformoterol  15 mcg Nebulization BID   budesonide (PULMICORT) nebulizer solution  0.25 mg Nebulization BID   buPROPion  300 mg Oral Daily   Chlorhexidine Gluconate Cloth  6 each Topical Daily   citalopram  40 mg Oral Daily   enoxaparin (LOVENOX) injection  40 mg Subcutaneous Q24H   guaiFENesin  600 mg Oral BID   ipratropium  0.5 mg Nebulization Q6H   levalbuterol  0.63 mg Nebulization Q6H   mouth rinse  15 mL Mouth Rinse 4 times per day   pantoprazole (PROTONIX) IV  40 mg Intravenous Q12H   traZODone  50 mg Oral QHS   Continuous Infusions:  sodium chloride 10 mL/hr at 02/06/22 1052   ceFEPime (MAXIPIME) IV Stopped (02/06/22 3735)   dexmedetomidine (PRECEDEX) IV infusion 1.2 mcg/kg/hr (02/06/22 1052)     LOS: 3 days    Critical Care Time Upon my evaluation, this patient had a high probability of imminent or life-threatening deterioration due to acute on chronic hypoxic respiratory failure requiring BiPAP and continuous precedex infusion, which required my direct attention, intervention, and personal management.  I have personally provided 45 minutes of critical care time exclusive of my time spent on separately billable procedures.  Time includes review of laboratory data,  radiology results, discussion with consultants, and monitoring for potential decompensation.       Melanie Ramirez J [place] DO [facility] Available via Epic secure chat 7am-7pm After these hours, please refer to coverage provider listed on [email or web address] 02/06/2022, 10:53 AM

## 2022-02-06 NOTE — Clinical Note (Incomplete)
2220 Dr. Velda Shell at bedside. RSI medications prepared for intubation  2225 time out performed

## 2022-02-06 NOTE — Progress Notes (Signed)
Melanie Ramirez   DOB:Jun 07, 1981   CH#:[id number]    ASSESSMENT & PLAN:  Metastatic lung cancer She is completed a course of radiation treatment and is due for chemotherapy next week Continue supportive care  Respiratory failure, mild pleuritic chest pain CTA chest performed which is negative for PE, unchanged pulmonary nodules, decrease in mediastinal and hilar lymphadenopathy She has been started on Solu-Medrol for 3 days as well as as needed morphine sulfate to help with her pain and increased work of breathing Pulmonology has consulted Respiratory panel negative Sputum culture grew some positive Gram rods but further testing pending Remains on IV antibiotics and nebulizers Remains on BiPAP  Leukocytosis Could be due to infection versus recent G-CSF versus recent dexamethasone Agree with broad-spectrum coverage until infection is ruled out  Mild pancytopenia Due to chemotherapy She does not need transfusion support  Mild anxiety As needed morphine as needed On low-dose Precedex  Goals of care discussions Resolution of worsening shortness of breath Appreciate assistance from palliative care team  Discharge planning I anticipate she will be here over the next few days  Mikey Bussing, NP 02/06/2022 2:10 PM  Subjective:  On BiPAP and still with increased work of breathing Noted that she had some nosebleed and bleeding from her mouth earlier today-now resolved No other bleeding noted.  Objective:  Vitals:   02/06/22 1331 02/06/22 1334  BP:    Pulse: (!) 119 (!) 122  Resp: 19 (!) 26  Temp:    SpO2: 100% 100%     Intake/Output Summary (Last 24 hours) at 02/06/2022 1410 Last data filed at 02/06/2022 1218 Gross per 24 hour  Intake 823.35 ml  Output 800 ml  Net 23.35 ml    GENERAL:alert.  She has oxygen delivered via nonrebreathing mask.  She had has increased work of breathing  NEURO: alert & oriented x 3 with fluent speech, no focal motor/sensory  deficits   Labs:  Recent Labs    12/03/21 2346 12/04/21 0500 01/03/22 1426 01/14/22 0814 02/20/2022 1349 02/04/22 0540 02/05/22 0530 02/06/22 0519  NA  --    < > 136 132* 132* 135 138 137  K  --    < > 3.8 3.6 4.3 3.6 4.4 3.6  CL  --    < > 104 101 100 104 107 106  CO2  --    < > 25 20* 20* 19* 23 23  GLUCOSE  --    < > 98 172* 127* 104* 80 78  BUN  --    < > 25* 28* 19 18 22* 23*  CREATININE  --    < > 0.62 0.73 0.66 0.60 0.76 0.77  CALCIUM  --    < > 8.7* 9.3 9.3 9.2 10.0 10.4*  GFRNONAA  --    < > >60 >60 >60 >60 >60 >60  PROT 7.4   < > 6.9 7.0 7.3  --   --   --   ALBUMIN 3.3*   < > 3.7 3.9 3.1*  --   --   --   AST 46*   < > 18 46* 44*  --   --   --   ALT 28   < > '14 23 26  '$ --   --   --   ALKPHOS 107   < > 97 170* 384*  --   --   --   BILITOT 0.5   < > 0.3 0.3 0.3  --   --   --  BILIDIR <0.1  --   --   --   --   --   --   --   IBILI NOT CALCULATED  --   --   --   --   --   --   --    < > = values in this interval not displayed.    Studies: I personally reviewed her chest x-ray DG Abd 1 View  Result Date: 01/21/2022 CLINICAL DATA:  Abdominal pain. EXAM: ABDOMEN - 1 VIEW COMPARISON:  None Available. FINDINGS: The bowel gas pattern is normal. No radio-opaque calculi are seen. Radiopaque contrast is seen within a markedly distended urinary bladder. IMPRESSION: 1. Normal bowel gas pattern. 2. Markedly distended urinary bladder. Electronically Signed   By: Virgina Norfolk M.D.   On: 01/24/2022 22:56   CT Angio Chest PE W and/or Wo Contrast  Result Date: 02/19/2022 CLINICAL DATA:  Increased dyspnea since yesterday, pulmonary embolism suspected EXAM: CT ANGIOGRAPHY CHEST WITH CONTRAST TECHNIQUE: Multidetector CT imaging of the chest was performed using the standard protocol during bolus administration of intravenous contrast. Multiplanar CT image reconstructions and MIPs were obtained to evaluate the vascular anatomy. RADIATION DOSE REDUCTION: This exam was performed according to  the departmental dose-optimization program which includes automated exposure control, adjustment of the mA and/or kV according to patient size and/or use of iterative reconstruction technique. CONTRAST:  44m OMNIPAQUE IOHEXOL 350 MG/ML SOLN COMPARISON:  Radiographs earlier today and CT chest 01/14/2022 FINDINGS: Cardiovascular: Satisfactory opacification of the pulmonary arteries to the segmental level. No evidence of pulmonary embolism. Normal heart size. Similar small pericardial effusion. Decreased compression of the SVC. Mediastinum/Nodes: Extensive bilateral mediastinal hilar adenopathy has slightly decreased since 01/14/2022. For example the right paratracheal node measures 2.0 cm, previously 3.2 cm in short axis with decreased compression of the trachea. Dominant prevascular node measures 2.4 cm, previously 2.9 cm. Similar mass effect on the right middle lobe and right upper lobe bronchi. Unremarkable esophagus. Normal thyroid. Lungs/Pleura: Bilateral pulmonary nodules and confluent masses are diffusely present throughout both lungs not significantly changed. Interstitial thickening in the lung bases favored to represent carcinomatosis. No pleural effusion or pneumothorax. Upper Abdomen: Retrocrural and celiac axis lymphadenopathy. Peripherally enhancing centrally hypoattenuating lesions in the liver incompletely evaluated due to early phase of contrast but compatible with metastases. Musculoskeletal: No osseous metastasis or acute abnormality. Review of the MIP images confirms the above findings. IMPRESSION: 1. Negative for acute pulmonary embolism. 2. Extensive pulmonary nodularity, confluent masses and interstitial thickening compatible with malignancy is unchanged. 3. Interval decrease in mediastinal and hilar lymphadenopathy with decreased compression on the SVC and trachea. 4. Retroperitoneal metastatic lymphadenopathy in the upper abdomen and probable multiple hepatic metastases. Electronically Signed    By: TPlacido SouM.D.   On: 02/02/2022 18:51   DG Chest 2 View  Result Date: 01/24/2022 CLINICAL DATA:  Possible sepsis, increasing shortness of breath EXAM: CHEST - 2 VIEW COMPARISON:  01/13/2022 FINDINGS: Cardiac size is within normal limits. Central pulmonary vessels are more prominent. There is a diffuse increase in interstitial and alveolar markings in both lungs with possible interval worsening. Lateral CP angles are essentially clear. There is no pneumothorax. Tip of right PICC line is seen in the region of superior vena cava. IMPRESSION: Central pulmonary vessels are prominent suggesting possible CHF. Extensive diffuse increased interstitial and alveolar markings are seen in both lungs with interval worsening. Findings suggest possible pulmonary edema or multifocal pneumonia. Possibility of underlying neoplastic process and underlying scarring is  not excluded. Electronically Signed   By: Elmer Picker M.D.   On: 01/29/2022 14:42   CT Angio Chest Pulmonary Embolism (PE) W or WO Contrast  Result Date: 01/14/2022 CLINICAL DATA:  Pulmonary embolus suspected with high probability. EXAM: CT ANGIOGRAPHY CHEST WITH CONTRAST TECHNIQUE: Multidetector CT imaging of the chest was performed using the standard protocol during bolus administration of intravenous contrast. Multiplanar CT image reconstructions and MIPs were obtained to evaluate the vascular anatomy. RADIATION DOSE REDUCTION: This exam was performed according to the departmental dose-optimization program which includes automated exposure control, adjustment of the mA and/or kV according to patient size and/or use of iterative reconstruction technique. CONTRAST:  53m OMNIPAQUE IOHEXOL 350 MG/ML SOLN COMPARISON:  01/02/2022 FINDINGS: Cardiovascular: Good opacification of the central and segmental pulmonary arteries. No focal filling defects. No evidence of significant pulmonary embolus. Normal heart size. Small pericardial effusion. Normal  caliber thoracic aorta. No aortic dissection. Great vessel origins are patent. A right central venous catheter is present with tip at the lower SVC. The superior vena cava is extrinsically compressed and is diminutive in caliber. This represents a change since previous study. Mediastinum/Nodes: Extensive lymphadenopathy throughout the mediastinum and hilar regions. Right paratracheal lymph nodes measure up to about 4.7 cm short axis dimension. Prominent lymph nodes extending along the pulmonary arteries and bronchi. Right hilar/suprahilar mass lesion measuring up to about 4.6 cm diameter. This is causing extrinsic compression of right middle lobe and right upper lobe bronchi. Esophagus is decompressed. Thyroid gland is unremarkable. Lungs/Pleura: Multiple pulmonary nodules and confluent masses demonstrated throughout. Nodules are mostly in a perihilar distribution. Interstitial changes in the lung bases may represent interstitial carcinomatosis. No pleural effusions. No pneumothorax. Upper Abdomen: Retrocrural and celiac axis lymphadenopathy. Heterogeneous parenchymal pattern to the liver. This is incompletely evaluated due to early phase of contrast but likely metastatic. Musculoskeletal: No bone metastasis are demonstrated. Review of the MIP images confirms the above findings. IMPRESSION: 1. No evidence of significant pulmonary embolus. 2. Extensive malignancy demonstrated throughout the chest. Prominent lymphadenopathy throughout the mediastinum, hilar, and peribronchovascular regions. Right hilar/suprahilar mass lesion. 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 3. Lymphadenopathy causes new extrinsic compression of the SVC resulting in SVC obstruction. 4. Metastatic lymphadenopathy in the celiac axis of the upper abdomen and probably in the liver. 5. Extrinsic compression of right middle lobe and right upper lobe bronchi with  postobstructive changes. Electronically Signed   By: WLucienne CapersM.D.   On: 01/14/2022 17:15   DG Chest 2 View  Result Date: 01/14/2022 CLINICAL DATA:  metastatic cancer to lungs worsening SOB EXAM: CHEST - 2 VIEW COMPARISON:  Chest CT 01/02/2022, radiograph 12/10/2021 FINDINGS: Right upper extremity catheter tip overlies the distal superior vena cava. Unchanged cardiomediastinal silhouette with known lymphadenopathy. There are unchanged nodular and reticular opacities bilaterally consistent with known metastatic disease. No pleural effusion. No pneumothorax. Bones are unchanged. IMPRESSION: Stable findings of pulmonary metastatic disease and mediastinal lymphadenopathy. No new acute findings. Electronically Signed   By: JMaurine SimmeringM.D.   On: 01/14/2022 08:02

## 2022-02-06 NOTE — Telephone Encounter (Signed)
Revealed negative genetic testing.  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 It could be due to a different gene that we are not testing, or maybe our current technology may not be able to pick something up.  It will be important for her to keep in contact with genetics to keep up with whether additional testing may be needed.

## 2022-02-06 NOTE — Progress Notes (Signed)
Chaplain was paged to support Melanie Ramirez's mother.  She feels that her daughter is suffering and she doesn't know what to do or what to hope for.  Chaplain provided compassionate listening as well as supportive presence and prayer.    [address] [place] Pager, [phone]

## 2022-02-06 NOTE — Progress Notes (Signed)
NAME:  Melanie Ramirez, MRN:  [id number], DOB:  Jul 09, 1980, LOS: 3 ADMISSION DATE:  02/14/2022, CONSULTATION DATE:  02/04/2022  REFERRING MD:  Dr. Tyrell Antonio - [facility], CHIEF COMPLAINT:  Respiratory failure    History of Present Illness:  Melanie Ramirez is a 41 y.o. 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 SOB was associated with tachycardia and an underlying persistent cough.    On ED arrival patient was seen afebrile, tachycardic, tachypneic and hypoxic.  Required application of 55 L high flow O2 via Ventimask for correction of hypoxia.  Lab work significant for sodium 132, alkaline phosphatase 384, albumin 3.1, AST 44, BNP 144.2, lactic acid 2.6, WBC 28, hemoglobin 9.1, platelets 105. Given high oxygen demand in the setting of metastatic cancer PCCM consulted for further management.   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   Significant Hospital Events: Including procedures, antibiotic start and stop dates in addition to other pertinent events   8/14 presented for increased SOB in the setting of metastatic cancer CTA chest on admit with unchanged pulmonary nodularity compatible with know malignancy. Improvement in lymphadenopathy but likely multiple hepatic mets  8/15 PCCM consulted   8/16 Required low dose Precedex drip overnight with BIPAP for work of breathing, continues to expressed severe subjective dyspnea  8/17-remains on Precedex, still with increased work of breathing and tachycardia  Interim History / Subjective:  Sleepy Arousable and  interactive  Objective   Blood pressure 136/75, pulse (!) 132, temperature 98.4 F (36.9 C), temperature source Axillary, resp. rate (!) 23, height _0  (1.651 m), weight 62.6 kg, last menstrual period 11/22/2021, SpO2 100 %, unknown if currently breastfeeding.    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, lungs, liver S/p chemoradiation -Most recent CT scan shows stable lung nodules, slightly decreased adenopathy, no infiltrative process   Acute hypoxemic respiratory failure Acute on chronic hypoxemic respiratory failure -Currently on BiPAP -BiPAP changes made to 14/6 FiO2 of 70% -High flow nasal cannula may be used to give a breaks of BiPAP  Small left pleural effusion -Too small for intervention  Agitation and sedation -On Precedex -Wean down dose of Precedex  On steroids for possible radiation pneumonitis Severe anxiety -On Celexa  Leukocytosis -Likely related to steroids -Empirically on antibiotics was started on vancomycin and cefepime, de-escalated to cefepime  Appreciate palliative care involvement  Ovarian mass -Supportive care -No biopsies with concern of rupture/spread of disease  Progressive respiratory failure She  is a poor candidate for ventilator support as it is likely that she may not wean We will continue goals of care discussions with  patient and family Risk of progression is high  Attempt to wean down dose of Precedex Continue morphine and as needed Ativan for anxiety  Best Practice (right click and "Reselect all SmartList Selections" daily)   Diet/type: NPO DVT prophylaxis: LMWH GI prophylaxis: PPI Lines: N/A Foley:  N/A Code Status:  full code Last date of multidisciplinary goals of care discussion: Update patient and family daily   The patient is critically ill with multiple organ systems failure and requires high complexity decision making for assessment and support, frequent evaluation and titration of therapies, application of advanced monitoring technologies and extensive interpretation of multiple databases. Critical Care Time devoted to patient care services described in this note independent of APP/resident time (if applicable)  is 32 minutes.   Sherrilyn Rist MD [facility] Personal pager: See Amion If unanswered, please page CCM On-call: [phone]

## 2022-02-06 NOTE — Progress Notes (Signed)
2318 husband, Theresia Lo, called and notified of plan  to intubate patient and he expresses understanding and agreement   2320 Dr. Velda Shell at bedside. RSI medications prepared for intubation  2325 time out performed   2338 ETT placement confirmed via monitor by RT and auscultation by this RN and MD  2350 OG tube placed without difficulty and cxr/abd xray ordered.

## 2022-02-06 NOTE — Progress Notes (Signed)
HPI:  Melanie Ramirez was previously seen in the [facility] due to a personal and family history of cancer and concerns regarding a hereditary predisposition to cancer. Please refer to our prior cancer genetics clinic note for more information regarding our discussion, assessment and recommendations, at the time. Melanie Ramirez recent genetic test results were disclosed to Melanie Ramirez, as were recommendations warranted by these results. These results and recommendations are discussed in more detail below.  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 Negative for acute PE or thoracic aortic dissection. 2. Encasement and narrowing of the right upper lobe pulmonary artery by tumor. 3. Stable bilateral pulmonary nodules, hilar and mediastinal adenopathy. 4. Progressive consolidation at the right lung base   12/04/2021 Imaging   1. A mixed solid and cystic mass replaces the left ovary measuring roughly 6.8 x 11.0 x 9.2 cm in greatest dimension. This may represent a primary ovarian mass or an ovarian metastasis. 2. Multiple pathologically enlarged lymph nodes within the gastrohepatic ligament, consistent with metastatic disease. 3. Innumerable pulmonary masses within the visualized lung bases bilaterally, better evaluated on previously performed dedicated CT imaging of the chest. 4. Small left pleural effusion. 5. Cholelithiasis without pericholecystic inflammatory change.     12/05/2021 Pathology Results   LUNG, RIGHT LOWER LOBE, NEEDLE CORE BIOPSY:  Non-small cell carcinoma, poorly differentiated with necrosis (see comment)   COMMENT:   Sections show multiple cores of pulmonary parenchyma multifocally infiltrated by sheets and nests of large, atypical cells with variably large round to oval nuclei and scattered small inconspicuous nucleoli.  There are foci of geographic tumor necrosis.  In areas focally the tumor shows  features suggestive of a squamous differentiation. Sixteen immunohistochemical stains are performed with adequate control.  The  tumor is positive for pancytokeratin (AE1/AE3).  The tumor also shows patchy positivity for high molecular weight cytokeratin 5/6.  The tumor is negative for cytokeratin 7 and cytokeratin 20.  The tumor is negative for the squamous markers p40 and p63.  The tumor is negative for the pulmonary adeno markers Napsin A and TTF-1.  The tumor is negative for the GYN and renal marker PAX8.  The tumor is negative for the GYN and mesothelial markers WT1 the tumor shows patchy positivity for the neuroendocrine markers synaptophysin and CD56 the tumor also shows variable positivity for p53.  The tumor shows variable strong positivity for p16.  The tumor is negative for estrogen receptor and progesterone receptor.   In summary the cores show a poorly differentiated non-small cell carcinoma with necrosis exhibiting focal neuroendocrine differentiation by immunohistochemistry.  This immunohistochemical pattern does not  suggest a primary for this tumor.  The cytokeratin 5/6 positivity, although commonly used for squamous differentiation within pulmonary tumors, may be seen in high-grade ovarian tumors particularly serous  carcinomas.  Clinically the patient is reported to have a large ovarian mass with multiple pulmonary nodules and extensive abdominal and mediastinal adenopathy.  Given this history, the p16 and p53 positivity  may be seen in ovarian tumors, but they may also be seen in tumors from other sites.  Also the tumor is negative for the more specific gynecologic markers cytokeratin 7, PAX8 and WT1.  Additional clinical  and radiologic correlation is recommended and an ovarian mass biopsy is suggested.  With additional tissue molecular testing would be possible if needed.    12/05/2021 Procedure   Successful CT-guided core needle biopsy  of right lower lung pulmonary nodule/mass.     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 Potential mixed response to interval therapy. The mediastinal adenopathy and extensive pulmonary nodularity demonstrated previously have improved in the interval. 2. There are multiple newly visible low-density hepatic lesions which are suspicious for metastatic disease. These may be visible due to interval treatment or reflect new lesions. 3. The complex solid and cystic adnexal mass has mildly enlarged from the prior study (primarily the cystic right-sided component). 4. Upper abdominal adenopathy has not significantly changed. 5. No acute findings identified.     01/03/2022 Imaging   Innumerable enhancing lesions scattered throughout the brain parenchyma consistent with metastatic disease to the brain   01/14/2022 Imaging   Stable findings of pulmonary metastatic disease and mediastinal lymphadenopathy. No new acute findings   01/14/2022 Imaging   1. No evidence of significant pulmonary embolus. 2. Extensive malignancy demonstrated throughout the chest. Prominent lymphadenopathy throughout the mediastinum, hilar, and peribronchovascular regions. Right hilar/suprahilar mass lesion. 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 3. Lymphadenopathy causes new extrinsic compression of the SVC resulting in SVC obstruction. 4. Metastatic lymphadenopathy in the celiac axis of the upper abdomen and probably in the liver. 5. Extrinsic compression of right middle lobe and right upper lobe bronchi with postobstructive changes.   01/21/2022 Genetic Testing   Negative genetic testing on the  CancerNext-Expanded+RNAinsight panel.  The report date is January 21, 2022.  The CancerNext-Expanded gene panel offered by [facility] and includes sequencing and rearrangement analysis for the following 77 genes: AIP, ALK, APC*, ATM*, AXIN2, BAP1, BARD1, BLM, BMPR1A, BRCA1*, BRCA2*, BRIP1*, CDC73, CDH1*, CDK4, CDKN1B, CDKN2A, CHEK2*, CTNNA1, DICER1, FANCC, FH, FLCN, GALNT12, KIF1B, LZTR1, MAX, MEN1, MET, MLH1*, MSH2*, MSH3, MSH6*, MUTYH*, NBN, NF1*, NF2, NTHL1, PALB2*, PHOX2B, PMS2*, POT1, PRKAR1A, PTCH1, PTEN*, RAD51C*, RAD51D*, RB1, RECQL, RET, SDHA, SDHAF2, SDHB, SDHC, SDHD, SMAD4, SMARCA4, SMARCB1, SMARCE1, STK11, SUFU, TMEM127, TP53*, TSC1, TSC2, VHL and XRCC2 (sequencing and deletion/duplication); EGFR, EGLN1, HOXB13, KIT, MITF, PDGFRA, POLD1, and POLE (sequencing only); EPCAM and GREM1 (deletion/duplication only). DNA and RNA analyses performed for * genes.    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  Significant diagnoses are listed below: Family History  Problem Relation Age of Onset   Depression Mother    Skin cancer Mother    Skin cancer Father    Depression Father    Depression Sister    Depression Brother    Heart failure Maternal Grandfather    Breast cancer Paternal Grandmother        dx > 37   Colon cancer Paternal Grandmother  dx > 59   Heart disease Paternal Grandfather    Depression Daughter    Breast cancer Other        MGM's sister dx > 6       The patient has two daughters who  are cancer free.  Melanie Ramirez oldest daughter had a fibrocystic tumor removed from Melanie Ramirez breast at age 31.  She has three sisters who are cancer free. Both parents are living.   The patient's mother is 39.  She has a brother and sister who are cancer free.  The maternal grandparents are deceased from no-cancer related issues. The grandmother's sister had breast cancer over 85's.   The patient's father has three sisters who are cancer free.  His mother had both breast and colon cancer over age 69.  His father died of heart disease.   Melanie Ramirez is unaware of previous family history of genetic testing for hereditary cancer risks. Patient's maternal ancestors are of [place], [place] and [place] descent, and paternal ancestors are of [place] and [place] descent. There is no reported Ashkenazi Jewish ancestry. There is no known consanguinity  GENETIC TEST RESULTS: Genetic testing reported out on January 31, 2022 through the CancerNext-Expanded+RNAinsight cancer panel found no pathogenic mutations. The CancerNext-Expanded gene panel offered by [facility] and includes sequencing and rearrangement analysis for the following 77 genes: AIP, ALK, APC*, ATM*, AXIN2, BAP1, BARD1, BLM, BMPR1A, BRCA1*, BRCA2*, BRIP1*, CDC73, CDH1*, CDK4, CDKN1B, CDKN2A, CHEK2*, CTNNA1, DICER1, FANCC, FH, FLCN, GALNT12, KIF1B, LZTR1, MAX, MEN1, MET, MLH1*, MSH2*, MSH3, MSH6*, MUTYH*, NBN, NF1*, NF2, NTHL1, PALB2*, PHOX2B, PMS2*, POT1, PRKAR1A, PTCH1, PTEN*, RAD51C*, RAD51D*, RB1, RECQL, RET, SDHA, SDHAF2, SDHB, SDHC, SDHD, SMAD4, SMARCA4, SMARCB1, SMARCE1, STK11, SUFU, TMEM127, TP53*, TSC1, TSC2, VHL and XRCC2 (sequencing and deletion/duplication); EGFR, EGLN1, HOXB13, KIT, MITF, PDGFRA, POLD1, and POLE (sequencing only); EPCAM and GREM1 (deletion/duplication only). DNA and RNA analyses performed for * genes. The test report has been scanned into EPIC and is located under the Molecular Pathology section of the Results Review tab.  A portion of the  result report is included below for reference.     We discussed with Melanie Ramirez that because current genetic testing is not perfect, it is possible there may be a gene mutation in one of these genes that current testing cannot detect, but that chance is small.  We also discussed, that there could be another gene that has not yet been discovered, or that we have not yet tested, that is responsible for the cancer diagnoses in the family. It is also possible there is a hereditary cause for the cancer in the family that Melanie Ramirez did not inherit and therefore was not identified in Melanie Ramirez testing.  Therefore, it is important to remain in touch with cancer genetics in the future so that we can continue to offer Melanie Ramirez the most up to date genetic testing.   ADDITIONAL GENETIC TESTING: We discussed with Melanie Ramirez that Melanie Ramirez genetic testing was fairly extensive.  If there are genes identified to increase cancer risk that can be analyzed in the future, we would be happy to discuss and coordinate this testing at that time.    CANCER SCREENING RECOMMENDATIONS: Melanie Ramirez test result is considered negative (normal).  This means that we have not identified a hereditary cause for Melanie Ramirez personal and family history of cancer at this time. Most cancers happen by chance and this negative test suggests that Melanie Ramirez cancer may fall into this category.    While reassuring,  this does not definitively rule out a hereditary predisposition to cancer. It is still possible that there could be genetic mutations that are undetectable by current technology. There could be genetic mutations in genes that have not been tested or identified to increase cancer risk.  Therefore, it is recommended she continue to follow the cancer management and screening guidelines provided by Melanie Ramirez oncology and primary healthcare provider.   An individual's cancer risk and medical management are not determined by genetic test results alone. Overall cancer risk  assessment incorporates additional factors, including personal medical history, family history, and any available genetic information that may result in a personalized plan for cancer prevention and surveillance  RECOMMENDATIONS FOR FAMILY MEMBERS:  Individuals in this family might be at some increased risk of developing cancer, over the general population risk, simply due to the family history of cancer.  We recommended women in this family have a yearly mammogram beginning at age 23, or 78 years younger than the earliest onset of cancer, an annual clinical breast exam, and perform monthly breast self-exams. Women in this family should also have a gynecological exam as recommended by their primary provider. All family members should be referred for colonoscopy starting at age 34.  FOLLOW-UP: Lastly, we discussed with Ms. Kaigler that cancer genetics is a rapidly advancing field and it is possible that new genetic tests will be appropriate for Melanie Ramirez and/or Melanie Ramirez family members in the future. We encouraged Melanie Ramirez to remain in contact with cancer genetics on an annual basis so we can update Melanie Ramirez personal and family histories and let Melanie Ramirez know of advances in cancer genetics that may benefit this family.   Our contact number was provided. Ms. Eggleton questions were answered to Melanie Ramirez satisfaction, and she knows she is welcome to call us at anytime with additional questions or concerns.   Roma Kayser, [place], [facility] Licensed, Certified Genetic Counselor [email or web address]

## 2022-02-06 NOTE — Progress Notes (Signed)
eLink Physician-Brief Progress Note Patient Name: Melanie Ramirez DOB: Nov 08, 1980 MRN: [id number]   Date of Service  02/06/2022  HPI/Events of Note  Off BiPAP In resp distress Lethargic  eICU Interventions  Placed back on BiPAP Bedside CCM team to intubate     Intervention Category Major Interventions: Respiratory failure - evaluation and management  Price Lachapelle V. Sukhmani Fetherolf 02/06/2022, 11:12 PM

## 2022-02-07 ENCOUNTER — Inpatient Hospital Stay: Payer: BC Managed Care – PPO

## 2022-02-07 ENCOUNTER — Inpatient Hospital Stay (HOSPITAL_COMMUNITY): Payer: BC Managed Care – PPO

## 2022-02-07 ENCOUNTER — Inpatient Hospital Stay: Payer: BC Managed Care – PPO | Admitting: Hematology and Oncology

## 2022-02-07 DIAGNOSIS — Z515 Encounter for palliative care: Secondary | ICD-10-CM

## 2022-02-07 DIAGNOSIS — J9621 Acute and chronic respiratory failure with hypoxia: Secondary | ICD-10-CM | POA: Diagnosis not present

## 2022-02-07 DIAGNOSIS — Z7189 Other specified counseling: Secondary | ICD-10-CM

## 2022-02-07 LAB — CBC
HCT: 30 % — ABNORMAL LOW (ref 36.0–46.0)
Hemoglobin: 9.7 g/dL — ABNORMAL LOW (ref 12.0–15.0)
MCH: 31 pg (ref 26.0–34.0)
MCHC: 32.3 g/dL (ref 30.0–36.0)
MCV: 95.8 fL (ref 80.0–100.0)
Platelets: 204 10*3/uL (ref 150–400)
RBC: 3.13 MIL/uL — ABNORMAL LOW (ref 3.87–5.11)
RDW: 16.2 % — ABNORMAL HIGH (ref 11.5–15.5)
WBC: 47.5 10*3/uL — ABNORMAL HIGH (ref 4.0–10.5)
nRBC: 0.5 % — ABNORMAL HIGH (ref 0.0–0.2)

## 2022-02-07 LAB — BLOOD GAS, ARTERIAL
Acid-base deficit: 7.8 mmol/L — ABNORMAL HIGH (ref 0.0–2.0)
Acid-base deficit: 8.1 mmol/L — ABNORMAL HIGH (ref 0.0–2.0)
Bicarbonate: 20 mmol/L (ref 20.0–28.0)
Bicarbonate: 21.9 mmol/L (ref 20.0–28.0)
Drawn by: 422461
Drawn by: 422461
FIO2: 100 %
FIO2: 90 %
MECHVT: 450 mL
O2 Saturation: 100 %
O2 Saturation: 99.7 %
PEEP: 5 cmH2O
PEEP: 5 cmH2O
Patient temperature: 36.1
Patient temperature: 36.7
RATE: 20 resp/min
RATE: 28 resp/min
pCO2 arterial: 49 mmHg — ABNORMAL HIGH (ref 32–48)
pCO2 arterial: 60 mmHg — ABNORMAL HIGH (ref 32–48)
pH, Arterial: 7.16 — CL (ref 7.35–7.45)
pH, Arterial: 7.21 — ABNORMAL LOW (ref 7.35–7.45)
pO2, Arterial: 135 mmHg — ABNORMAL HIGH (ref 83–108)
pO2, Arterial: 142 mmHg — ABNORMAL HIGH (ref 83–108)

## 2022-02-07 LAB — BASIC METABOLIC PANEL WITH GFR
Anion gap: 10 (ref 5–15)
BUN: 38 mg/dL — ABNORMAL HIGH (ref 6–20)
CO2: 19 mmol/L — ABNORMAL LOW (ref 22–32)
Calcium: 11.2 mg/dL — ABNORMAL HIGH (ref 8.9–10.3)
Chloride: 110 mmol/L (ref 98–111)
Creatinine, Ser: 1.15 mg/dL — ABNORMAL HIGH (ref 0.44–1.00)
GFR, Estimated: >60 mL/min
Glucose, Bld: 100 mg/dL — ABNORMAL HIGH (ref 70–99)
Potassium: 3.9 mmol/L (ref 3.5–5.1)
Sodium: 139 mmol/L (ref 135–145)

## 2022-02-07 LAB — GLUCOSE, CAPILLARY
Glucose-Capillary: 59 mg/dL — ABNORMAL LOW (ref 70–99)
Glucose-Capillary: 87 mg/dL (ref 70–99)
Glucose-Capillary: 69 mg/dL — ABNORMAL LOW (ref 70–99)
Glucose-Capillary: 139 mg/dL — ABNORMAL HIGH (ref 70–99)
Glucose-Capillary: 100 mg/dL — ABNORMAL HIGH (ref 70–99)

## 2022-02-07 LAB — TRIGLYCERIDES: Triglycerides: 291 mg/dL — ABNORMAL HIGH (ref <150)

## 2022-02-07 LAB — MAGNESIUM: Magnesium: 2.4 mg/dL (ref 1.7–2.4)

## 2022-02-07 MED ORDER — ORAL CARE MOUTH RINSE
15.0000 mL | OROMUCOSAL | Status: DC
  Administered 2022-02-07 (×5): 15 mL via OROMUCOSAL

## 2022-02-07 MED ORDER — GUAIFENESIN 100 MG/5ML PO LIQD: 15.0000 mL | Freq: Four times a day (QID) | ORAL | Status: DC

## 2022-02-07 MED ORDER — LORAZEPAM 2 MG/ML IJ SOLN
1.0000 mg | INTRAMUSCULAR | Status: DC | PRN
Start: 2022-02-07 — End: 2022-02-08

## 2022-02-07 MED ORDER — AMIODARONE IV BOLUS ONLY 150 MG/100ML
150.0000 mg | Freq: Once | INTRAVENOUS | Status: AC
  Administered 2022-02-07: 150 mg via INTRAVENOUS
  Filled 2022-02-07: qty 100

## 2022-02-07 MED ORDER — MIDAZOLAM HCL 2 MG/2ML IJ SOLN
2.0000 mg | Freq: Once | INTRAMUSCULAR | Status: AC
Start: 2022-02-07 — End: 2022-02-06

## 2022-02-07 MED ORDER — FENTANYL BOLUS VIA INFUSION
50.0000 ug | INTRAVENOUS | Status: DC | PRN
  Administered 2022-02-07 (×2): 75 ug via INTRAVENOUS

## 2022-02-07 MED ORDER — DEXTROSE 50 % IV SOLN: 12.5000 g | INTRAVENOUS | Status: AC

## 2022-02-07 MED ORDER — CITALOPRAM HYDROBROMIDE 20 MG PO TABS
40.0000 mg | ORAL_TABLET | Freq: Every day | ORAL | Status: DC
  Administered 2022-02-07: 40 mg via NASOGASTRIC
  Filled 2022-02-07: qty 2

## 2022-02-07 MED ORDER — ACETAMINOPHEN 325 MG PO TABS
650.0000 mg | ORAL_TABLET | Freq: Four times a day (QID) | ORAL | Status: DC | PRN
  Administered 2022-02-07: 650 mg via NASOGASTRIC
  Filled 2022-02-07: qty 2

## 2022-02-07 MED ORDER — FENTANYL 2500MCG IN NS 250ML (10MCG/ML) PREMIX INFUSION
0.0000 ug/h | INTRAVENOUS | Status: DC
  Administered 2022-02-07: 100 ug/h via INTRAVENOUS
  Filled 2022-02-07: qty 250

## 2022-02-07 MED ORDER — PHENYLEPHRINE HCL-NACL 20-0.9 MG/250ML-% IV SOLN
0.0000 ug/min | INTRAVENOUS | Status: DC
  Administered 2022-02-07: 80 ug/min via INTRAVENOUS
  Administered 2022-02-07: 20 ug/min via INTRAVENOUS
  Administered 2022-02-07: 120 ug/min via INTRAVENOUS
  Administered 2022-02-07: 90 ug/min via INTRAVENOUS
  Filled 2022-02-07 (×5): qty 250

## 2022-02-07 MED ORDER — DEXTROSE 50 % IV SOLN
INTRAVENOUS | Status: AC
  Administered 2022-02-07: 25 g via INTRAVENOUS
  Filled 2022-02-07: qty 50

## 2022-02-07 MED ORDER — DEXTROSE 50 % IV SOLN
12.5000 g | INTRAVENOUS | Status: AC
  Administered 2022-02-07: 12.5 g via INTRAVENOUS
  Filled 2022-02-07: qty 50

## 2022-02-07 MED ORDER — LACTATED RINGERS IV BOLUS
1000.0000 mL | Freq: Once | INTRAVENOUS | Status: AC
  Administered 2022-02-07: 1000 mL via INTRAVENOUS

## 2022-02-07 MED ORDER — NOREPINEPHRINE 4 MG/250ML-% IV SOLN
0.0000 ug/min | INTRAVENOUS | Status: DC
  Administered 2022-02-07: 4 ug/min via INTRAVENOUS
  Administered 2022-02-07: 2 ug/min via INTRAVENOUS
  Filled 2022-02-07: qty 250

## 2022-02-07 MED ORDER — TRAZODONE HCL 50 MG PO TABS
50.0000 mg | ORAL_TABLET | Freq: Every day | ORAL | Status: DC
  Administered 2022-02-07: 50 mg via NASOGASTRIC
  Filled 2022-02-07: qty 1

## 2022-02-07 MED ORDER — GUAIFENESIN-DM 100-10 MG/5ML PO SYRP: 5.0000 mL | ORAL_SOLUTION | ORAL | Status: DC | PRN

## 2022-02-07 MED ORDER — LACTATED RINGERS IV BOLUS
500.0000 mL | Freq: Once | INTRAVENOUS | Status: AC
  Administered 2022-02-07: 500 mL via INTRAVENOUS

## 2022-02-07 MED ORDER — BUPROPION HCL 75 MG PO TABS
150.0000 mg | ORAL_TABLET | Freq: Two times a day (BID) | ORAL | Status: DC
  Administered 2022-02-07: 150 mg via NASOGASTRIC
  Filled 2022-02-07 (×2): qty 2

## 2022-02-07 MED FILL — Dexamethasone Sodium Phosphate Inj 100 MG/10ML: INTRAMUSCULAR | Qty: 1 | Status: AC

## 2022-02-07 MED FILL — Fosaprepitant Dimeglumine For IV Infusion 150 MG (Base Eq): INTRAVENOUS | Qty: 5 | Status: AC

## 2022-02-07 NOTE — Progress Notes (Signed)
Physical Therapy Discharge Patient Details Name: Annica Marinello MRN: [id number] DOB: June 07, 1981 Today's Date: 02/07/2022 Time:  -     Patient discharged from PT services secondary to medical decline - Now on ventilator, will need to re-order PT to resume therapy services.    [place] Office [phone] Weekend pager-[phone]   GP     Claretha Cooper 02/07/2022, 7:12 AM

## 2022-02-07 NOTE — Progress Notes (Signed)
Chaplain checked in throughout the day with Melanie Ramirez's family. They declined speaking at these times, but were appreciative of the check ins.  Along with Chaplain Lorrin Jackson who worked with [name] at the [facility], brought patient a quilt. At this time the family is mostly wanting time together. Melanie Ramirez's sister from [place] will be coming late 71 and [name]'s mother is coming from out of town as well.  [address] [place] Pager, [phone]

## 2022-02-07 NOTE — Progress Notes (Signed)
Chaplain provided support to Melanie Ramirez's mother and husband as they sat with Melanie Ramirez. Their own pastor is coming as well.  Chaplain provided emotional support and prayer as they talked about how to tell Melanie Ramirez and Melanie Ramirez's daughters, Melanie Ramirez and Melanie Ramirez (ages 15 and 53).  Melanie Ramirez's father died when he was 41 years old and Melanie Ramirez is grappling with whether to bring his daughters to the hospital.  He does not want them to see their mother like this.  Chaplain encouraged him to trust his instinct since he knows his girls best. Chaplain also reminded him that it is not just how he shares the news with them, but how they are supported as they move forward. Chaplain provided prayer and emotional support.  [address] [place] Pager, [phone]

## 2022-02-07 NOTE — Progress Notes (Signed)
Palliative:  HPI: 41 y.o. female  with past medical history of non-small cell carcinoma (path 12/05/2021) of unknown primary (likely lung but patient also has ovarian mass) with metastasis to lung, liver, brain now s/p radiation/chemotherapy, chronic hypoxic respiratory failure on supplemental oxygen 2L [place] baseline, GERD who presented to [facility] ED on 8/15 for progressive shortness of breath, associated with tachycardia and cough. PMT was consulted for [place] conversations. Ongoing decline requiring intubation 8/17.   I met today at [facility] bedside with her husband, Melanie Ramirez, and mother. I met with family at multiple times throughout the day. Family are appropriately distressed with seeing Melanie Ramirez on life support. Melanie Ramirez is very clear and upfront that he does not want for Melanie Ramirez to suffer. He does not want to prolong her suffering either. They are in touch with close family to allow time for visitation and then they wish to transition to comfort care. Have verified goals for DNR status but maintain current interventions to allow time for sister to arrive from [place] later tonight while provided medications to ensure comfort and minimize suffering as well. Educated regarding [facility] and hospice bereavement support. Allowing space for family to visit and have time together with Melanie Ramirez.   All questions/concerns addressed. Emotional support provided. Discussed with RN and PCCM.   Exam: Unresponsive on vent. HR 150s. Agonal breathes on vent but more comfortable breathing and rest after fentanyl infusion initiated.   Plan: - DNR (maintain intubation).  - Continue current interventions with plans for extubation to comfort in the morning.  - Initiating fentanyl infusion and bolus as needed to achieve comfort. Liberal Ativan as needed for anxiety.   80 min  Melanie Sill, NP Palliative Medicine Team Pager [phone] (Please see [email or web address] for schedule) Team Phone [phone]    Greater than 50%  of this time was spent  counseling and coordinating care related to the above assessment and plan

## 2022-02-07 NOTE — Progress Notes (Signed)
NAME:  Murdis Flitton, MRN:  [id number], DOB:  [birth year], LOS: 4 ADMISSION DATE:  01/22/2022, CONSULTATION DATE:  02/04/2022  REFERRING MD:  Dr. Tyrell Antonio - [facility], CHIEF COMPLAINT:  Respiratory failure    History of Present Illness:  Melanie Ramirez is a 41 y.o. 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 SOB was associated with tachycardia and an underlying persistent cough.    On ED arrival patient was seen afebrile, tachycardic, tachypneic and hypoxic.  Required application of 55 L high flow O2 via Ventimask for correction of hypoxia.  Lab work significant for sodium 132, alkaline phosphatase 384, albumin 3.1, AST 44, BNP 144.2, lactic acid 2.6, WBC 28, hemoglobin 9.1, platelets 105. Given high oxygen demand in the setting of metastatic cancer PCCM consulted for further management.   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   Significant Hospital Events: Including procedures, antibiotic start and stop dates in addition to other pertinent events   8/14 presented for increased SOB in the setting of metastatic cancer CTA chest on admit with unchanged pulmonary nodularity compatible with know malignancy. Improvement in lymphadenopathy but likely multiple hepatic mets  8/15 PCCM consulted   8/16 Required low dose Precedex drip overnight with BIPAP for work of breathing, continues to expressed severe subjective dyspnea  8/17-remains on Precedex, still with increased work of breathing and tachycardia 8/18-intubated last evening  Interim History / Subjective:   Obtunded  Objective   Blood pressure (!) 81/61, pulse (!) 154, temperature 98 F (36.7 C), temperature source Oral, resp. rate (!) 21, height 5' 5" (1.651 m), weight 62.6 kg, last menstrual period 11/22/2021, SpO2 96 %, unknown if currently breastfeeding.    Vent Mode: PCV FiO2 (%):  [60 %-100 %] 60 % Set Rate:  [20 bmp-28 bmp] 28 bmp Vt Set:  [450 mL] 450 mL PEEP:  [5 cmH20] 5 cmH20 Plateau Pressure:  [23 cmH20-27 cmH20] 23 cmH20   Intake/Output Summary (Last 24 hours) at 02/07/2022 4665 Last data filed at 02/07/2022 9935 Gross per 24 hour  Intake 2082.68 ml  Output 650 ml  Net 1432.68 ml   Filed Weights   02/02/2022 1335 02/04/22 0030  Weight: 63.5 kg 62.6 kg    Examination: General: Chronically ill-appearing HEENT: Endotracheal tube in place, moist oral mucosa Neuro: Obtunded  CV: S1-S2 appreciated, tachycardic PULM: Breath sounds are coarse bilaterally GI: Bowel sounds appreciated Extremities: warm/dry, no edema  Skin: no rashes or lesions  BMP shows a BUN of 38, creatinine 1.15 Leukocytosis with a white count of 47.5, hematocrit of 30  Resolved Hospital Problem list     Assessment & Plan:   Spoke with spouse at bedside Just wants her comfortable Continue lines of care while arrangements been made for family to visit Appreciate palliative care involvement  Non-small cell carcinoma of unknown primary Ovarian mass 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 S/p chemoradiation -Most recent CT scan shows stable lung nodules, decreased adenopathy, no infiltrative process  Acute hypoxemic respiratory failure Acute on chronic hypoxemic/hypercapnic respiratory failure -On vent -Continue full vent support -VAP bundle in place  Small left pleural  effusion -No intervention  Agitation and sedation in a ventilated patient -On propofol, fentanyl as needed  Leukocytosis -Likely related to steroids -Empirically on cefepime-day 5  Appreciate palliative care  involvement  Ovarian mass -Supportive care -No biopsies with concern of rupture/spread of disease  Progressive respiratory failure End-stage disease process  Best Practice (right click and "Reselect all SmartList Selections" daily)   Diet/type: NPO DVT prophylaxis: LMWH GI prophylaxis: PPI Lines: N/A Foley:  N/A Code Status:  full code Last date of multidisciplinary goals of care discussion: Update patient and family daily   The patient is critically ill with multiple organ systems failure and requires high complexity decision making for assessment and support, frequent evaluation and titration of therapies, application of advanced monitoring technologies and extensive interpretation of multiple databases. Critical Care Time devoted to patient care services described in this note independent of APP/resident time (if applicable)  is 32 minutes.   Sherrilyn Rist MD [place] [facility] Personal pager: See Amion If unanswered, please page CCM On-call: [phone]

## 2022-02-07 NOTE — Progress Notes (Signed)
eLink Physician-Brief Progress Note Patient Name: Melanie Ramirez DOB: [birth year] MRN: [id number]   Date of Service  02/07/2022  HPI/Events of Note  Hypotensive ABG >> resp acidosis  eICU Interventions  1L LR bolus Increase RR to 24     Intervention Category Major Interventions: Hypotension - evaluation and management;Acid-Base disturbance - evaluation and management  Melanie Ramirez 02/07/2022, 1:23 AM

## 2022-02-07 NOTE — Progress Notes (Addendum)
Melanie Ramirez   DOB:1980/10/07   QP#:[id number]    I saw the patient and met with her husband and mother at the [facility]  ASSESSMENT & PLAN:  Metastatic lung cancer She is completed a course of radiation treatment and is due for chemotherapy next week Has declined rapidly and no longer a candidate for chemotherapy Husband starting to discuss withdrawal of care once family members have arrived I support his decision.  Respiratory failure, mild pleuritic chest pain CTA chest performed which is negative for PE, unchanged pulmonary nodules, decrease in mediastinal and hilar lymphadenopathy She has been started on Solu-Medrol for 3 days as well as as needed morphine sulfate to help with her pain and increased work of breathing Pulmonology has consulted Respiratory panel negative Sputum culture grew some positive Gram rods but further testing pending Remains on IV antibiotics and nebulizers Intubated overnight Family leaning toward withdrawal of care/comfort measures once other family members have arrived Continue current mode of care until family makes decision Agreed to transition her care to comfort measures once family is ready  Leukocytosis Could be due to infection versus recent G-CSF versus recent dexamethasone Agree with broad-spectrum coverage pending decision about transition to comfort measures  Mild pancytopenia Due to chemotherapy She does not need transfusion support  Mild anxiety As needed morphine as needed On low-dose Precedex  Hypercalcemia Likely due to underlying malignancy Overall, prognosis poor  Goals of care discussions Intubated overnight. Developing AKI and hypercalcemia. Family does not want to prolong suffering. Will likely transition to comfort measures/one way extubation when they are ready. Palliative care following.   Discharge planning Anticipate hospital death.  I offered my emotional support to her family  Mikey Bussing, NP 02/07/2022 8:50  AM Heath Lark, MD  Subjective:  Events overnight noted Intubated, sedated Husband at bedside CT imaging and labs are reviewed Overall, the patient continues on the trajectory of decline  Objective:  Vitals:   02/07/22 0829 02/07/22 0832  BP:  (!) 95/56  Pulse:  (!) 146  Resp:  (!) 30  Temp:    SpO2: 94% 97%     Intake/Output Summary (Last 24 hours) at 02/07/2022 0850 Last data filed at 02/07/2022 0819 Gross per 24 hour  Intake 2191.2 ml  Output 650 ml  Net 1541.2 ml    GENERAL: Sedated, Intubated    Labs:  Recent Labs    12/03/21 2346 12/04/21 0500 01/03/22 1426 01/14/22 0814 02/02/2022 1349 02/04/22 0540 02/05/22 0530 02/06/22 0519 02/07/22 0557  NA  --    < > 136 132* 132*   < > 138 137 139  K  --    < > 3.8 3.6 4.3   < > 4.4 3.6 3.9  CL  --    < > 104 101 100   < > 107 106 110  CO2  --    < > 25 20* 20*   < > 23 23 19*  GLUCOSE  --    < > 98 172* 127*   < > 80 78 100*  BUN  --    < > 25* 28* 19   < > 22* 23* 38*  CREATININE  --    < > 0.62 0.73 0.66   < > 0.76 0.77 1.15*  CALCIUM  --    < > 8.7* 9.3 9.3   < > 10.0 10.4* 11.2*  GFRNONAA  --    < > >60 >60 >60   < > >60 >60 >60  PROT  7.4   < > 6.9 7.0 7.3  --   --   --   --   ALBUMIN 3.3*   < > 3.7 3.9 3.1*  --   --   --   --   AST 46*   < > 18 46* 44*  --   --   --   --   ALT 28   < > 14 23 26   --   --   --   --   ALKPHOS 107   < > 97 170* 384*  --   --   --   --   BILITOT 0.5   < > 0.3 0.3 0.3  --   --   --   --   BILIDIR <0.1  --   --   --   --   --   --   --   --   IBILI NOT CALCULATED  --   --   --   --   --   --   --   --    < > = values in this interval not displayed.    Studies: I personally reviewed her chest x-ray DG CHEST PORT 1 VIEW  Result Date: 02/07/2022 CLINICAL DATA:  Intubated EXAM: PORTABLE CHEST 1 VIEW COMPARISON:  02/06/2022 FINDINGS: Single frontal view of the chest demonstrates endotracheal tube overlying tracheal air column tip at thoracic inlet. Enteric catheter passes below  diaphragm tip excluded by collimation. Right-sided PICC unchanged. Cardiac silhouette is obscured by multifocal bilateral airspace disease, without appreciable change since prior study. Increased density at the right apex could reflect an area of pleural fluid. No pneumothorax. No acute bony abnormalities. IMPRESSION: 1. Support devices as above. 2. Confluent multifocal bilateral airspace disease unchanged since prior exam. This may reflect edema or infection superimposed upon background nodularity identified on prior chest CT 02/09/2022. 3. Small right apical pleural effusion. Electronically Signed   By: Randa Ngo M.D.   On: 02/07/2022 08:12   DG Abd Portable 1V  Result Date: 02/07/2022 CLINICAL DATA:  OG tube positioning. EXAM: PORTABLE ABDOMEN - 1 VIEW COMPARISON:  02/07/2022. FINDINGS: Nonobstructive bowel-gas pattern. An enteric tube terminates in the stomach. There is redemonstration of multifocal airspace opacities at the lung bases bilaterally. No radiopaque calculi are seen. IMPRESSION: Enteric tube terminates in the stomach. Electronically Signed   By: Brett Fairy M.D.   On: 02/07/2022 03:40   DG Abd 1 View  Result Date: 02/07/2022 CLINICAL DATA:  Nasogastric tube placement. EXAM: ABDOMEN - 1 VIEW COMPARISON:  February 03, 2022 FINDINGS: Marked severity multifocal infiltrates are seen within the visualized portions of the bilateral lung bases. A nasogastric tube is seen with its distal tip overlying the mid left abdomen. Its distal side hole sits approximate 6.1 cm distal to the expected region of the gastroesophageal junction. A small amount of air is seen within the gastric fundus, with a paucity of bowel gas noted throughout the remainder of the abdomen. No radio-opaque calculi or other significant radiographic abnormality are seen. IMPRESSION: Nasogastric tube positioning, as described above. Electronically Signed   By: Virgina Norfolk M.D.   On: 02/07/2022 01:20   DG CHEST PORT 1  VIEW  Result Date: 02/07/2022 CLINICAL DATA:  Post intubation, ETT position. EXAM: PORTABLE CHEST 1 VIEW COMPARISON:  01/24/2022. FINDINGS: The heart is enlarged and the pulmonary vasculature is distended. Patchy airspace disease is present in the lungs bilaterally. No definite effusion or pneumothorax. The endotracheal  tube terminates 3.6 cm above the carina. A right PICC line terminates over the superior vena cava. An enteric tube courses over the the left upper quadrant and out of the field of view. IMPRESSION: 1. Cardiomegaly with pulmonary vascular congestion. 2. Stable patchy airspace disease in the lungs bilaterally, possible edema or infiltrate. 3. Support apparatus as described above. Electronically Signed   By: Brett Fairy M.D.   On: 02/07/2022 00:10   DG Abd 1 View  Result Date: 02/02/2022 CLINICAL DATA:  Abdominal pain. EXAM: ABDOMEN - 1 VIEW COMPARISON:  None Available. FINDINGS: The bowel gas pattern is normal. No radio-opaque calculi are seen. Radiopaque contrast is seen within a markedly distended urinary bladder. IMPRESSION: 1. Normal bowel gas pattern. 2. Markedly distended urinary bladder. Electronically Signed   By: Virgina Norfolk M.D.   On: 01/23/2022 22:56   CT Angio Chest PE W and/or Wo Contrast  Result Date: 02/18/2022 CLINICAL DATA:  Increased dyspnea since yesterday, pulmonary embolism suspected EXAM: CT ANGIOGRAPHY CHEST WITH CONTRAST TECHNIQUE: Multidetector CT imaging of the chest was performed using the standard protocol during bolus administration of intravenous contrast. Multiplanar CT image reconstructions and MIPs were obtained to evaluate the vascular anatomy. RADIATION DOSE REDUCTION: This exam was performed according to the departmental dose-optimization program which includes automated exposure control, adjustment of the mA and/or kV according to patient size and/or use of iterative reconstruction technique. CONTRAST:  42m OMNIPAQUE IOHEXOL 350 MG/ML SOLN  COMPARISON:  Radiographs earlier today and CT chest 01/14/2022 FINDINGS: Cardiovascular: Satisfactory opacification of the pulmonary arteries to the segmental level. No evidence of pulmonary embolism. Normal heart size. Similar small pericardial effusion. Decreased compression of the SVC. Mediastinum/Nodes: Extensive bilateral mediastinal hilar adenopathy has slightly decreased since 01/14/2022. For example the right paratracheal node measures 2.0 cm, previously 3.2 cm in short axis with decreased compression of the trachea. Dominant prevascular node measures 2.4 cm, previously 2.9 cm. Similar mass effect on the right middle lobe and right upper lobe bronchi. Unremarkable esophagus. Normal thyroid. Lungs/Pleura: Bilateral pulmonary nodules and confluent masses are diffusely present throughout both lungs not significantly changed. Interstitial thickening in the lung bases favored to represent carcinomatosis. No pleural effusion or pneumothorax. Upper Abdomen: Retrocrural and celiac axis lymphadenopathy. Peripherally enhancing centrally hypoattenuating lesions in the liver incompletely evaluated due to early phase of contrast but compatible with metastases. Musculoskeletal: No osseous metastasis or acute abnormality. Review of the MIP images confirms the above findings. IMPRESSION: 1. Negative for acute pulmonary embolism. 2. Extensive pulmonary nodularity, confluent masses and interstitial thickening compatible with malignancy is unchanged. 3. Interval decrease in mediastinal and hilar lymphadenopathy with decreased compression on the SVC and trachea. 4. Retroperitoneal metastatic lymphadenopathy in the upper abdomen and probable multiple hepatic metastases. Electronically Signed   By: TPlacido SouM.D.   On: 02/04/2022 18:51   DG Chest 2 View  Result Date: 02/12/2022 CLINICAL DATA:  Possible sepsis, increasing shortness of breath EXAM: CHEST - 2 VIEW COMPARISON:  01/13/2022 FINDINGS: Cardiac size is within  normal limits. Central pulmonary vessels are more prominent. There is a diffuse increase in interstitial and alveolar markings in both lungs with possible interval worsening. Lateral CP angles are essentially clear. There is no pneumothorax. Tip of right PICC line is seen in the region of superior vena cava. IMPRESSION: Central pulmonary vessels are prominent suggesting possible CHF. Extensive diffuse increased interstitial and alveolar markings are seen in both lungs with interval worsening. Findings suggest possible pulmonary edema or multifocal pneumonia. Possibility of  underlying neoplastic process and underlying scarring is not excluded. Electronically Signed   By: Elmer Picker M.D.   On: 02/16/2022 14:42   CT Angio Chest Pulmonary Embolism (PE) W or WO Contrast  Result Date: 01/14/2022 CLINICAL DATA:  Pulmonary embolus suspected with high probability. EXAM: CT ANGIOGRAPHY CHEST WITH CONTRAST TECHNIQUE: Multidetector CT imaging of the chest was performed using the standard protocol during bolus administration of intravenous contrast. Multiplanar CT image reconstructions and MIPs were obtained to evaluate the vascular anatomy. RADIATION DOSE REDUCTION: This exam was performed according to the departmental dose-optimization program which includes automated exposure control, adjustment of the mA and/or kV according to patient size and/or use of iterative reconstruction technique. CONTRAST:  61m OMNIPAQUE IOHEXOL 350 MG/ML SOLN COMPARISON:  01/02/2022 FINDINGS: Cardiovascular: Good opacification of the central and segmental pulmonary arteries. No focal filling defects. No evidence of significant pulmonary embolus. Normal heart size. Small pericardial effusion. Normal caliber thoracic aorta. No aortic dissection. Great vessel origins are patent. A right central venous catheter is present with tip at the lower SVC. The superior vena cava is extrinsically compressed and is diminutive in caliber. This  represents a change since previous study. Mediastinum/Nodes: Extensive lymphadenopathy throughout the mediastinum and hilar regions. Right paratracheal lymph nodes measure up to about 4.7 cm short axis dimension. Prominent lymph nodes extending along the pulmonary arteries and bronchi. Right hilar/suprahilar mass lesion measuring up to about 4.6 cm diameter. This is causing extrinsic compression of right middle lobe and right upper lobe bronchi. Esophagus is decompressed. Thyroid gland is unremarkable. Lungs/Pleura: Multiple pulmonary nodules and confluent masses demonstrated throughout. Nodules are mostly in a perihilar distribution. Interstitial changes in the lung bases may represent interstitial carcinomatosis. No pleural effusions. No pneumothorax. Upper Abdomen: Retrocrural and celiac axis lymphadenopathy. Heterogeneous parenchymal pattern to the liver. This is incompletely evaluated due to early phase of contrast but likely metastatic. Musculoskeletal: No bone metastasis are demonstrated. Review of the MIP images confirms the above findings. IMPRESSION: 1. No evidence of significant pulmonary embolus. 2. Extensive malignancy demonstrated throughout the chest. Prominent lymphadenopathy throughout the mediastinum, hilar, and peribronchovascular regions. Right hilar/suprahilar mass lesion. 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 3. Lymphadenopathy causes new extrinsic compression of the SVC resulting in SVC obstruction. 4. Metastatic lymphadenopathy in the celiac axis of the upper abdomen and probably in the liver. 5. Extrinsic compression of right middle lobe and right upper lobe bronchi with postobstructive changes. Electronically Signed   By: WLucienne CapersM.D.   On: 01/14/2022 17:15   DG Chest 2 View  Result Date: 01/14/2022 CLINICAL DATA:  metastatic cancer to lungs worsening SOB EXAM: CHEST - 2 VIEW COMPARISON:  Chest CT  01/02/2022, radiograph 12/10/2021 FINDINGS: Right upper extremity catheter tip overlies the distal superior vena cava. Unchanged cardiomediastinal silhouette with known lymphadenopathy. There are unchanged nodular and reticular opacities bilaterally consistent with known metastatic disease. No pleural effusion. No pneumothorax. Bones are unchanged. IMPRESSION: Stable findings of pulmonary metastatic disease and mediastinal lymphadenopathy. No new acute findings. Electronically Signed   By: JMaurine SimmeringM.D.   On: 01/14/2022 08:02

## 2022-02-07 NOTE — Progress Notes (Signed)
NUTRITION NOTE  Consult received from CCM MD last night to alert RD that patient had been intubated. OGT placed at that time (gastric per abdominal x-ray today at 0315).  Able to talk with RN, CCM NP, and CCM MD.   If nutrition-related needs arise, such as plan for TF initiation via OGT, please re-consult RD.     Jarome Matin, MS, RD, LDN, [place] Registered Dietitian II Inpatient Clinical Nutrition RD pager # and on-call/weekend pager # available in [facility]

## 2022-02-08 LAB — CULTURE, BLOOD (ROUTINE X 2)
Culture: NO GROWTH
Culture: NO GROWTH
Special Requests: ADEQUATE

## 2022-02-08 LAB — CULTURE, RESPIRATORY W GRAM STAIN: Culture: NORMAL

## 2022-02-10 ENCOUNTER — Inpatient Hospital Stay: Payer: BC Managed Care – PPO

## 2022-02-11 ENCOUNTER — Inpatient Hospital Stay: Payer: BC Managed Care – PPO

## 2022-02-12 ENCOUNTER — Inpatient Hospital Stay: Payer: BC Managed Care – PPO

## 2022-02-14 ENCOUNTER — Inpatient Hospital Stay: Payer: BC Managed Care – PPO

## 2022-02-20 ENCOUNTER — Institutional Professional Consult (permissible substitution): Payer: BC Managed Care – PPO | Admitting: Pulmonary Disease

## 2022-02-21 ENCOUNTER — Inpatient Hospital Stay: Payer: BC Managed Care – PPO | Admitting: Hematology and Oncology

## 2022-02-21 ENCOUNTER — Inpatient Hospital Stay: Payer: BC Managed Care – PPO

## 2022-02-21 NOTE — Procedures (Signed)
Extubation Procedure Note  Patient Details:   Name: Melanie Ramirez DOB: August 24, 1980 MRN: [id number]   Airway Documentation:  Airway 7.5 mm (Active)  Secured at (cm) 23 cm 02/07/22 1900  Measured From Lips 02/07/22 1900  Secured Location Left 02/07/22 1900  Secured By [employer] 02/07/22 1900  Tube Holder Repositioned Yes 02/07/22 1900  Prone position No 02/07/22 1900  Head position Right 02/07/22 0600  Cuff Pressure (cm H2O) Green OR 18-26 [facility] 02/07/22 0832  Site Condition Cool;Dry 02/07/22 0832   Vent end date: 02/07/22 Vent end time: 2359   Evaluation  O2 sats: currently acceptable Complications: No apparent complications Patient did tolerate procedure well. Bilateral Breath Sounds: Diminished, Expiratory wheezes   Patient extubated per MD order  [name] 02-28-2022, 12:11 AM

## 2022-02-21 NOTE — Death Summary Note (Signed)
DEATH SUMMARY   Patient Details  Name: Melanie Ramirez MRN: [id number] DOB: 17-Aug-1980  Admission/Discharge Information   Admit Date:  13-Feb-2022  Date of Death: Date of Death: 02-18-2022  Time of Death: Time of Death: 2022/09/22  Length of Stay: 5  Referring Physician: Scheryl Marten, PA   Reason(s) for Hospitalization  Patient was admitted to the hospital worsening shortness of breath  Diagnoses  Preliminary cause of death:  Metastatic non-small cell cancer Secondary Diagnoses (including complications and co-morbidities):  Principal Problem:   Acute on chronic respiratory failure with hypoxia ([place]) Active Problems:   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 year old female who was admitted to the hospital with worsening shortness of breath.  Metastases to both lungs, brain and liver.  She is s/p chemoradiation.  Came in with worsening shortness of breath of a few days duration. She did develop worsening oxygen requirements, CT scan was negative for PE, no infiltrative process.  With worsening shortness of breath, has required BiPAP continues to have increased work of breathing.  Required opiates and benzodiazepines for anxiety.  Despite aggressive support, continued to deteriorate proceeding to requiring ventilator support. Following conversations with family was eventually transition to comfort measures secondary to progression of underlying metastatic disease.  Succumbed to her illness on 02-18-22 at 0022 hrs. Cause of death-metastatic non-small cell cancer    Pertinent Labs and Studies  Significant Diagnostic Studies DG CHEST PORT 1 VIEW  Result Date: 02/07/2022 CLINICAL DATA:  Intubated EXAM: PORTABLE CHEST 1 VIEW COMPARISON:   02/06/2022 FINDINGS: Single frontal view of the chest demonstrates endotracheal tube overlying tracheal air column tip at thoracic inlet. Enteric catheter passes below diaphragm tip excluded by collimation. Right-sided PICC unchanged. Cardiac silhouette is obscured by multifocal bilateral airspace disease, without appreciable change since prior study. Increased density at the right apex could reflect an area of pleural fluid. No pneumothorax. No acute bony abnormalities. IMPRESSION: 1. Support devices as above. 2. Confluent multifocal bilateral airspace disease unchanged since prior exam. This may reflect edema or infection superimposed upon background nodularity identified on prior chest CT 02-13-2022. 3. Small right apical pleural effusion. Electronically Signed   By: Randa Ngo M.D.   On: 02/07/2022 08:12   DG Abd Portable 1V  Result Date: 02/07/2022 CLINICAL DATA:  OG tube positioning. EXAM: PORTABLE ABDOMEN - 1 VIEW COMPARISON:  02/07/2022. FINDINGS: Nonobstructive bowel-gas pattern. An enteric tube terminates in the stomach. There is redemonstration of multifocal airspace opacities at the lung bases bilaterally. No radiopaque calculi are seen. IMPRESSION: Enteric tube terminates in the stomach. Electronically Signed   By: Brett Fairy M.D.   On: 02/07/2022 03:40   DG Abd 1 View  Result Date: 02/07/2022 CLINICAL DATA:  Nasogastric tube placement. EXAM: ABDOMEN - 1 VIEW COMPARISON:  February 13, 2022 FINDINGS: Marked severity multifocal infiltrates are seen within the visualized portions of the bilateral lung bases. A nasogastric tube is seen with its distal tip overlying the mid left abdomen. Its distal side hole sits approximate 6.1 cm distal to the expected region of the gastroesophageal junction. A small amount of air is seen within the gastric fundus, with a paucity of bowel gas noted throughout the remainder of the abdomen. No radio-opaque calculi or other significant  radiographic abnormality  are seen. IMPRESSION: Nasogastric tube positioning, as described above. Electronically Signed   By: Virgina Norfolk M.D.   On: 02/07/2022 01:20   DG CHEST PORT 1 VIEW  Result Date: 02/07/2022 CLINICAL DATA:  Post intubation, ETT position. EXAM: PORTABLE CHEST 1 VIEW COMPARISON:  02/07/2022. FINDINGS: The heart is enlarged and the pulmonary vasculature is distended. Patchy airspace disease is present in the lungs bilaterally. No definite effusion or pneumothorax. The endotracheal tube terminates 3.6 cm above the carina. A right PICC line terminates over the superior vena cava. An enteric tube courses over the the left upper quadrant and out of the field of view. IMPRESSION: 1. Cardiomegaly with pulmonary vascular congestion. 2. Stable patchy airspace disease in the lungs bilaterally, possible edema or infiltrate. 3. Support apparatus as described above. Electronically Signed   By: Brett Fairy M.D.   On: 02/07/2022 00:10   DG Abd 1 View  Result Date: 01/29/2022 CLINICAL DATA:  Abdominal pain. EXAM: ABDOMEN - 1 VIEW COMPARISON:  None Available. FINDINGS: The bowel gas pattern is normal. No radio-opaque calculi are seen. Radiopaque contrast is seen within a markedly distended urinary bladder. IMPRESSION: 1. Normal bowel gas pattern. 2. Markedly distended urinary bladder. Electronically Signed   By: Virgina Norfolk M.D.   On: 01/22/2022 22:56   CT Angio Chest PE W and/or Wo Contrast  Result Date: 02/13/2022 CLINICAL DATA:  Increased dyspnea since yesterday, pulmonary embolism suspected EXAM: CT ANGIOGRAPHY CHEST WITH CONTRAST TECHNIQUE: Multidetector CT imaging of the chest was performed using the standard protocol during bolus administration of intravenous contrast. Multiplanar CT image reconstructions and MIPs were obtained to evaluate the vascular anatomy. RADIATION DOSE REDUCTION: This exam was performed according to the departmental dose-optimization program which includes automated exposure  control, adjustment of the mA and/or kV according to patient size and/or use of iterative reconstruction technique. CONTRAST:  70m OMNIPAQUE IOHEXOL 350 MG/ML SOLN COMPARISON:  Radiographs earlier today and CT chest 01/14/2022 FINDINGS: Cardiovascular: Satisfactory opacification of the pulmonary arteries to the segmental level. No evidence of pulmonary embolism. Normal heart size. Similar small pericardial effusion. Decreased compression of the SVC. Mediastinum/Nodes: Extensive bilateral mediastinal hilar adenopathy has slightly decreased since 01/14/2022. For example the right paratracheal node measures 2.0 cm, previously 3.2 cm in short axis with decreased compression of the trachea. Dominant prevascular node measures 2.4 cm, previously 2.9 cm. Similar mass effect on the right middle lobe and right upper lobe bronchi. Unremarkable esophagus. Normal thyroid. Lungs/Pleura: Bilateral pulmonary nodules and confluent masses are diffusely present throughout both lungs not significantly changed. Interstitial thickening in the lung bases favored to represent carcinomatosis. No pleural effusion or pneumothorax. Upper Abdomen: Retrocrural and celiac axis lymphadenopathy. Peripherally enhancing centrally hypoattenuating lesions in the liver incompletely evaluated due to early phase of contrast but compatible with metastases. Musculoskeletal: No osseous metastasis or acute abnormality. Review of the MIP images confirms the above findings. IMPRESSION: 1. Negative for acute pulmonary embolism. 2. Extensive pulmonary nodularity, confluent masses and interstitial thickening compatible with malignancy is unchanged. 3. Interval decrease in mediastinal and hilar lymphadenopathy with decreased compression on the SVC and trachea. 4. Retroperitoneal metastatic lymphadenopathy in the upper abdomen and probable multiple hepatic metastases. Electronically Signed   By: TPlacido SouM.D.   On: 02/06/2022 18:51   DG Chest 2  View  Result Date: 02/09/2022 CLINICAL DATA:  Possible sepsis, increasing shortness of breath EXAM: CHEST - 2 VIEW COMPARISON:  01/13/2022 FINDINGS: Cardiac size is within normal limits. Central pulmonary  vessels are more prominent. There is a diffuse increase in interstitial and alveolar markings in both lungs with possible interval worsening. Lateral CP angles are essentially clear. There is no pneumothorax. Tip of right PICC line is seen in the region of superior vena cava. IMPRESSION: Central pulmonary vessels are prominent suggesting possible CHF. Extensive diffuse increased interstitial and alveolar markings are seen in both lungs with interval worsening. Findings suggest possible pulmonary edema or multifocal pneumonia. Possibility of underlying neoplastic process and underlying scarring is not excluded. Electronically Signed   By: Elmer Picker M.D.   On: 02/19/2022 14:42   CT Angio Chest Pulmonary Embolism (PE) W or WO Contrast  Result Date: 01/14/2022 CLINICAL DATA:  Pulmonary embolus suspected with high probability. EXAM: CT ANGIOGRAPHY CHEST WITH CONTRAST TECHNIQUE: Multidetector CT imaging of the chest was performed using the standard protocol during bolus administration of intravenous contrast. Multiplanar CT image reconstructions and MIPs were obtained to evaluate the vascular anatomy. RADIATION DOSE REDUCTION: This exam was performed according to the departmental dose-optimization program which includes automated exposure control, adjustment of the mA and/or kV according to patient size and/or use of iterative reconstruction technique. CONTRAST:  23m OMNIPAQUE IOHEXOL 350 MG/ML SOLN COMPARISON:  01/02/2022 FINDINGS: Cardiovascular: Good opacification of the central and segmental pulmonary arteries. No focal filling defects. No evidence of significant pulmonary embolus. Normal heart size. Small pericardial effusion. Normal caliber thoracic aorta. No aortic dissection. Great vessel origins  are patent. A right central venous catheter is present with tip at the lower SVC. The superior vena cava is extrinsically compressed and is diminutive in caliber. This represents a change since previous study. Mediastinum/Nodes: Extensive lymphadenopathy throughout the mediastinum and hilar regions. Right paratracheal lymph nodes measure up to about 4.7 cm short axis dimension. Prominent lymph nodes extending along the pulmonary arteries and bronchi. Right hilar/suprahilar mass lesion measuring up to about 4.6 cm diameter. This is causing extrinsic compression of right middle lobe and right upper lobe bronchi. Esophagus is decompressed. Thyroid gland is unremarkable. Lungs/Pleura: Multiple pulmonary nodules and confluent masses demonstrated throughout. Nodules are mostly in a perihilar distribution. Interstitial changes in the lung bases may represent interstitial carcinomatosis. No pleural effusions. No pneumothorax. Upper Abdomen: Retrocrural and celiac axis lymphadenopathy. Heterogeneous parenchymal pattern to the liver. This is incompletely evaluated due to early phase of contrast but likely metastatic. Musculoskeletal: No bone metastasis are demonstrated. Review of the MIP images confirms the above findings. IMPRESSION: 1. No evidence of significant pulmonary embolus. 2. Extensive malignancy demonstrated throughout the chest. Prominent lymphadenopathy throughout the mediastinum, hilar, and peribronchovascular regions. Right hilar/suprahilar mass lesion. 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 3. Lymphadenopathy causes new extrinsic compression of the SVC resulting in SVC obstruction. 4. Metastatic lymphadenopathy in the celiac axis of the upper abdomen and probably in the liver. 5. Extrinsic compression of right middle lobe and right upper lobe bronchi with postobstructive changes. Electronically Signed   By: WLucienne CapersM.D.   On:  01/14/2022 17:15   DG Chest 2 View  Result Date: 01/14/2022 CLINICAL DATA:  metastatic cancer to lungs worsening SOB EXAM: CHEST - 2 VIEW COMPARISON:  Chest CT 01/02/2022, radiograph 12/10/2021 FINDINGS: Right upper extremity catheter tip overlies the distal superior vena cava. Unchanged cardiomediastinal silhouette with known lymphadenopathy. There are unchanged nodular and reticular opacities bilaterally consistent with known metastatic disease. No pleural effusion. No pneumothorax. Bones are unchanged. IMPRESSION: Stable findings of pulmonary metastatic disease and mediastinal lymphadenopathy. No new acute  findings. Electronically Signed   By: Maurine Simmering M.D.   On: 01/14/2022 08:02    Microbiology Recent Results (from the past 240 hour(s))  Culture, blood (Routine x 2)     Status: None   Collection Time: 01/27/2022  1:25 PM   Specimen: BLOOD  Result Value Ref Range Status   Specimen Description   Final    BLOOD BLOOD LEFT FOREARM Performed at [facility] [address] [place], [place] [address]    Special Requests   Final    BOTTLES DRAWN AEROBIC AND ANAEROBIC Blood Culture adequate volume Performed at [facility] [address] [place], [place] [address]    Culture   Final    NO GROWTH 5 DAYS Performed at [facility], [place] [address], [place], [place] [address]    Report Status Feb 26, 2022 FINAL  Final  Culture, blood (Routine x 2)     Status: None   Collection Time: 02/10/2022  1:50 PM   Specimen: BLOOD  Result Value Ref Range Status   Specimen Description   Final    BLOOD BLOOD LEFT HAND Performed at [facility] [address], [place], [place] [address]    Special Requests   Final    BOTTLES DRAWN AEROBIC AND ANAEROBIC Blood Culture results may not be optimal due to an inadequate volume of blood received in culture bottles Performed at [facility] [address] [place], [place]  [address]    Culture   Final    NO GROWTH 5 DAYS Performed at [facility], [place] [address], [place], [place] [address]    Report Status 02/26/2022 FINAL  Final  SARS Coronavirus 2 by RT PCR (hospital order, performed in [facility] hospital lab) *cepheid single result test* Anterior Nasal Swab     Status: None   Collection Time: 01/23/2022  3:17 PM   Specimen: Anterior Nasal Swab  Result Value Ref Range Status   SARS Coronavirus 2 by RT PCR NEGATIVE NEGATIVE Final    Comment: (NOTE) SARS-CoV-2 target nucleic acids are NOT DETECTED.  The SARS-CoV-2 RNA is generally detectable in upper and lower respiratory specimens during the acute phase of infection. The lowest concentration of SARS-CoV-2 viral copies this assay can detect is 250 copies / mL. A negative result does not preclude SARS-CoV-2 infection and should not be used as the sole basis for treatment or other patient management decisions.  A negative result may occur with improper specimen collection / handling, submission of specimen other than nasopharyngeal swab, presence of viral mutation(s) within the areas targeted by this assay, and inadequate number of viral copies (<250 copies / mL). A negative result must be combined with clinical observations, patient history, and epidemiological information.  Fact Sheet for Patients:   https://www.patel.info/  Fact Sheet for Healthcare Providers: https://hall.com/  This test is not yet approved or  cleared by the [place] FDA and has been authorized for detection and/or diagnosis of SARS-CoV-2 by FDA under an Emergency Use Authorization (EUA).  This EUA will remain in effect (meaning this test can be used) for the duration of the COVID-19 declaration under Section 564(b)(1) of the Act, 21 U.S.C. section 360bbb-3(b)(1), unless the authorization is terminated or revoked sooner.  Performed at [facility], [place]  [address], [place],  [address]   MRSA Next Gen by PCR, Nasal     Status: None   Collection Time: 02/04/22 12:18 AM  Specimen: Nasal Mucosa; Nasal Swab  Result Value Ref Range Status   MRSA by PCR Next Gen NOT DETECTED NOT DETECTED Final    Comment: (NOTE) The GeneXpert MRSA Assay (FDA approved for NASAL specimens only), is one component of a comprehensive MRSA colonization surveillance program. It is not intended to diagnose MRSA infection nor to guide or monitor treatment for MRSA infections. Test performance is not FDA approved in patients less than 62 years old. Performed at [facility], [place] [address] [place], [place] [address]   Respiratory (~20 pathogens) panel by PCR     Status: None   Collection Time: 02/05/22  9:14 AM   Specimen: Nasopharyngeal Swab; Respiratory  Result Value Ref Range Status   Adenovirus NOT DETECTED NOT DETECTED Final   Coronavirus 229E NOT DETECTED NOT DETECTED Final    Comment: (NOTE) The Coronavirus on the Respiratory Panel, DOES NOT test for the novel  Coronavirus (2019 nCoV)    Coronavirus HKU1 NOT DETECTED NOT DETECTED Final   Coronavirus NL63 NOT DETECTED NOT DETECTED Final   Coronavirus OC43 NOT DETECTED NOT DETECTED Final   Metapneumovirus NOT DETECTED NOT DETECTED Final   Rhinovirus / Enterovirus NOT DETECTED NOT DETECTED Final   Influenza A NOT DETECTED NOT DETECTED Final   Influenza B NOT DETECTED NOT DETECTED Final   Parainfluenza Virus 1 NOT DETECTED NOT DETECTED Final   Parainfluenza Virus 2 NOT DETECTED NOT DETECTED Final   Parainfluenza Virus 3 NOT DETECTED NOT DETECTED Final   Parainfluenza Virus 4 NOT DETECTED NOT DETECTED Final   Respiratory Syncytial Virus NOT DETECTED NOT DETECTED Final   Bordetella pertussis NOT DETECTED NOT DETECTED Final   Bordetella Parapertussis NOT DETECTED NOT DETECTED Final   Chlamydophila pneumoniae NOT DETECTED NOT DETECTED Final   Mycoplasma pneumoniae NOT DETECTED NOT  DETECTED Final    Comment: Performed at [facility], [place]. [address], [place], [place] [address]  Expectorated Sputum Assessment w Gram Stain, Rflx to Resp Cult     Status: None   Collection Time: 02/05/22  7:26 PM   Specimen: Sputum  Result Value Ref Range Status   Specimen Description SPUTUM  Final   Special Requests NONE  Final   Sputum evaluation   Final    THIS SPECIMEN IS ACCEPTABLE FOR SPUTUM CULTURE Performed at [facility], [place] [address] [place], [place] [address]    Report Status 02/05/2022 FINAL  Final  Culture, Respiratory w Gram Stain     Status: None   Collection Time: 02/05/22  7:26 PM   Specimen: SPU  Result Value Ref Range Status   Specimen Description   Final    SPUTUM Performed at [facility] [address], [place], [place] [address]    Special Requests   Final    NONE Reflexed from [id number] Performed at [facility], [place] [address], [place], [place] [address]    Gram Stain   Final    FEW WBC PRESENT, PREDOMINANTLY PMN FEW GRAM POSITIVE RODS    Culture   Final    FEW Normal respiratory flora-no Staph aureus or Pseudomonas seen Performed at [facility] [address], [place], [place] [address]    Report Status 03-05-22 FINAL  Final    Lab Basic Metabolic Panel: Recent Labs  Lab 02/06/22 0519 02/07/22 0557  NA 137 139  K 3.6 3.9  CL 106 110  CO2 23 19*  GLUCOSE 78 100*  BUN 23*  38*  CREATININE 0.77 1.15*  CALCIUM 10.4* 11.2*  MG  --  2.4   Liver Function Tests: No results for input(s): "AST", "ALT", "ALKPHOS", "BILITOT", "PROT", "ALBUMIN" in the last 168 hours. No results for input(s): "LIPASE", "AMYLASE" in the last 168 hours. No results for input(s): "AMMONIA" in the last 168 hours. CBC: Recent Labs  Lab 02/06/22 0519 02/07/22 0557  WBC 44.7* 47.5*  HGB 9.3* 9.7*  HCT 27.5* 30.0*  MCV 90.8 95.8  PLT 140* 204   Cardiac Enzymes: No results for input(s):  "CKTOTAL", "CKMB", "CKMBINDEX", "TROPONINI" in the last 168 hours. Sepsis Labs: Recent Labs  Lab 02/06/22 0519 02/07/22 0557  WBC 44.7* 47.5*    Procedures/Operations  Endotracheal intubation 02/06/2022   Yanelli Zapanta A Gregor Dershem 02/12/2022, 11:20 AM

## 2022-02-21 NOTE — Progress Notes (Signed)
Sepsis due to unknown infection

## 2022-02-21 DEATH — deceased

## 2022-02-25 NOTE — Progress Notes (Signed)
                                                                                                                                                             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   Plan: The patient will follow-up with radiation oncology in 27mo  Addendum: Ms. GJakubeksadly passed away before scheduled follow-up. -----------------------------------  SEppie Gibson MD

## 2022-03-05 ENCOUNTER — Ambulatory Visit: Payer: Self-pay | Admitting: Radiation Oncology

## 2022-04-03 ENCOUNTER — Other Ambulatory Visit: Payer: BC Managed Care – PPO

## 2022-04-08 ENCOUNTER — Ambulatory Visit: Payer: Self-pay | Admitting: Radiation Oncology

## 2023-01-12 IMAGING — CT CT BIOPSY
1 of 3 series · 15 of 32 positions shown, 19 images · non-contrast
Comparison: none

INDICATION: Multiple lung masses

[Series 2: i-spiral 5.0 b40f · axial · 0.69mm/px · z∈[+1033,+1323]mm · 15 of 91 slices shown, 19 images]
[im 4/91  soft-tissue]
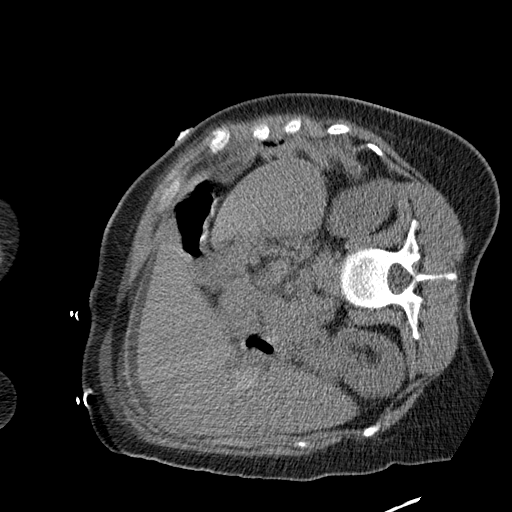
[im 4/91  bone]
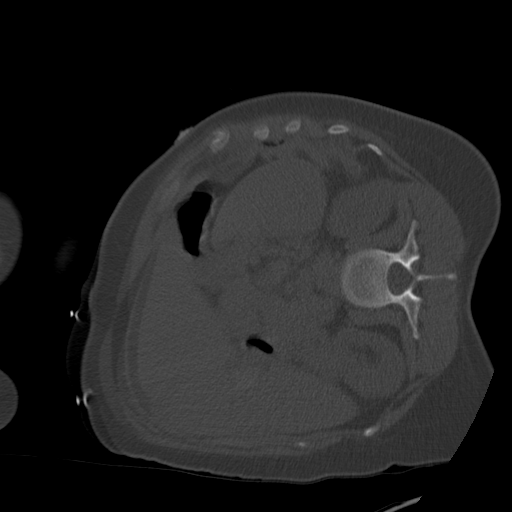
[im 12/91  soft-tissue]
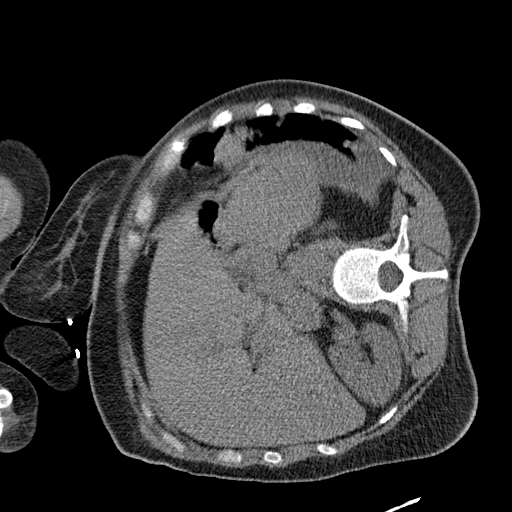
[im 20/91  soft-tissue]
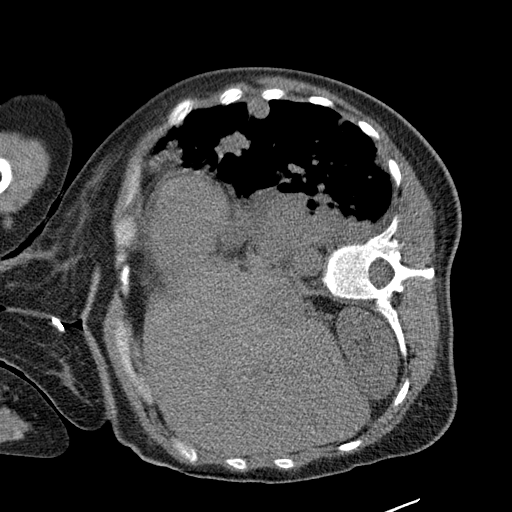
[im 24/91  soft-tissue]
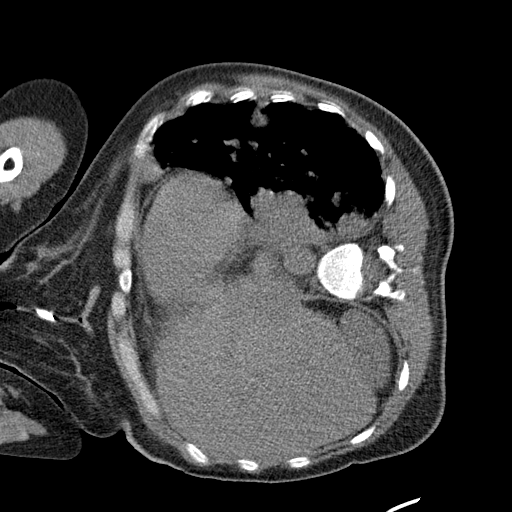
[im 32/91  soft-tissue]
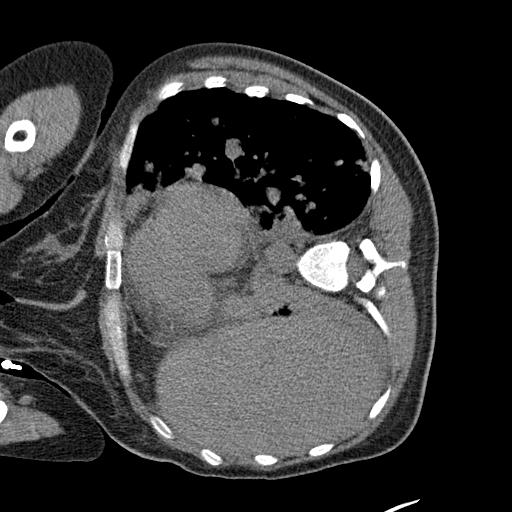
[im 40/91  soft-tissue]
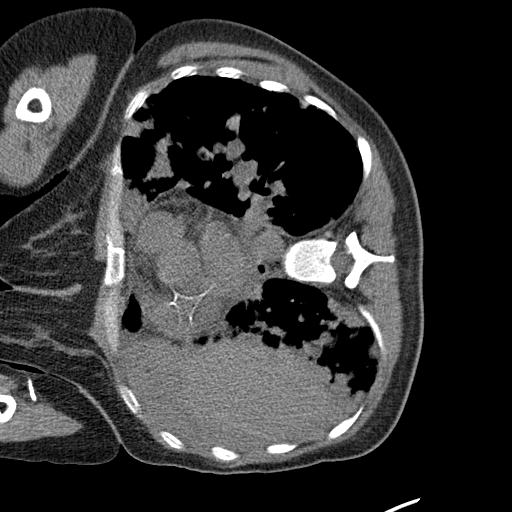
[im 47/91  soft-tissue]
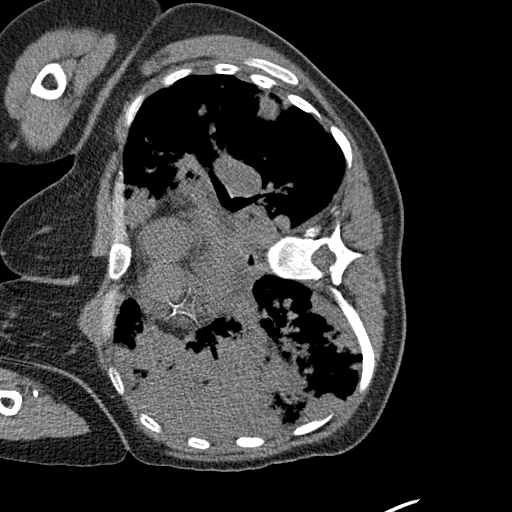
[im 51/91  soft-tissue]
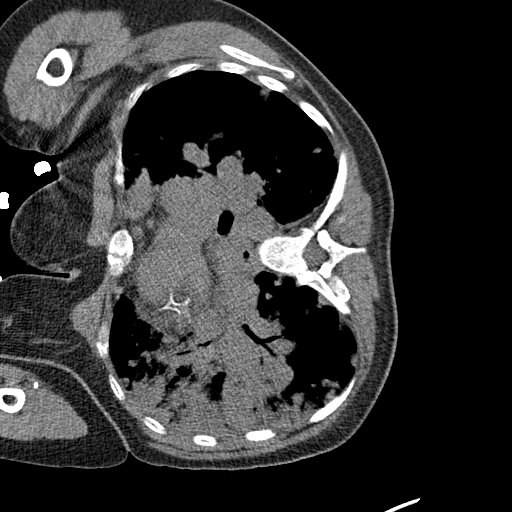
[im 59/91  soft-tissue]
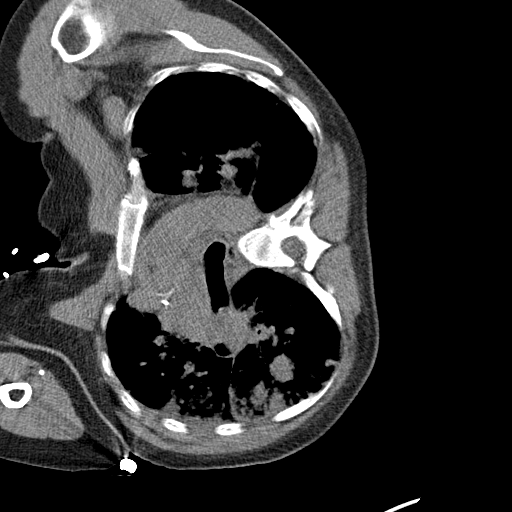
[im 59/91  bone]
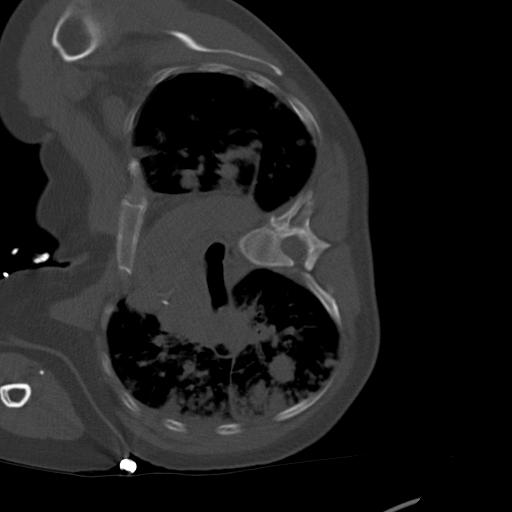
[im 67/91  soft-tissue]
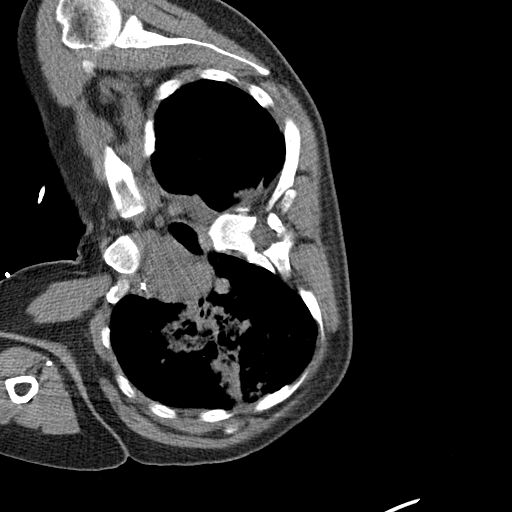
[im 71/91  soft-tissue]
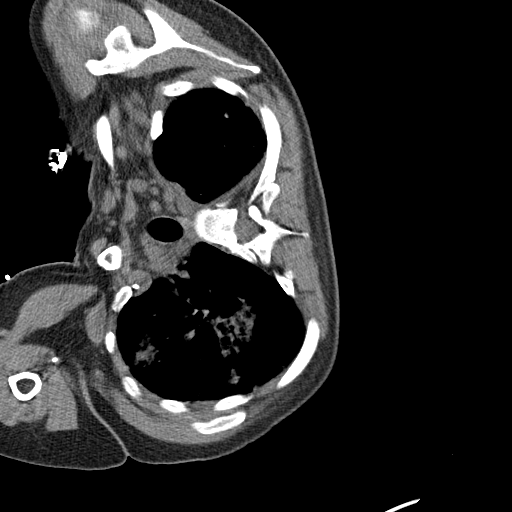
[im 75/91  lung]
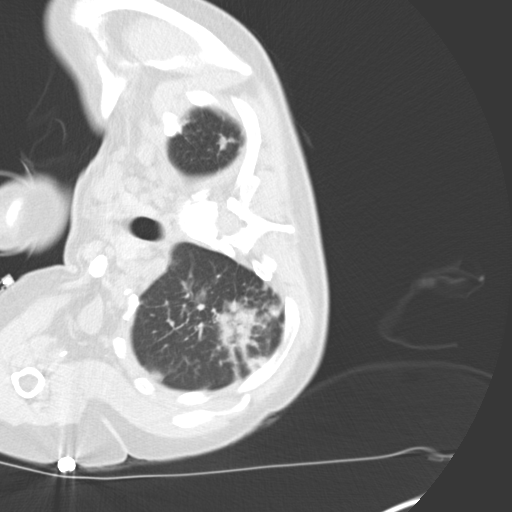
[im 79/91  soft-tissue]
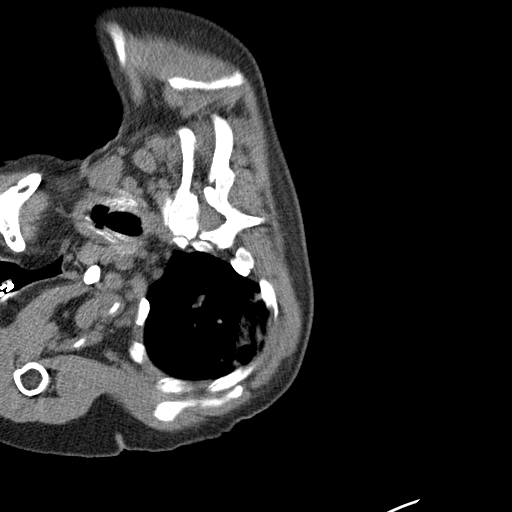
[im 79/91  lung]
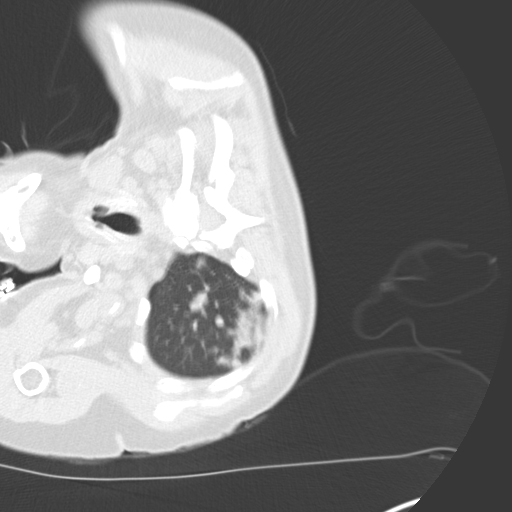
[im 83/91  lung]
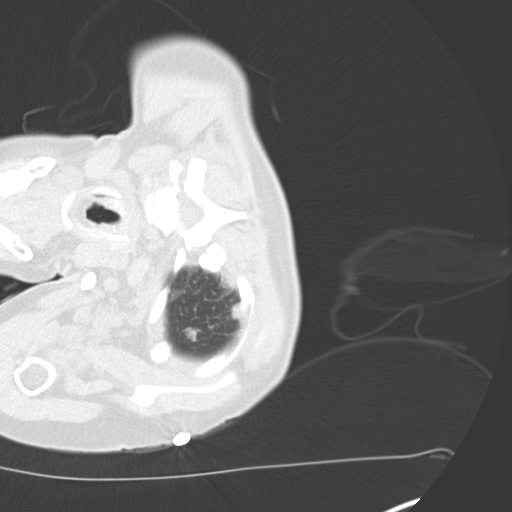
[im 87/91  soft-tissue]
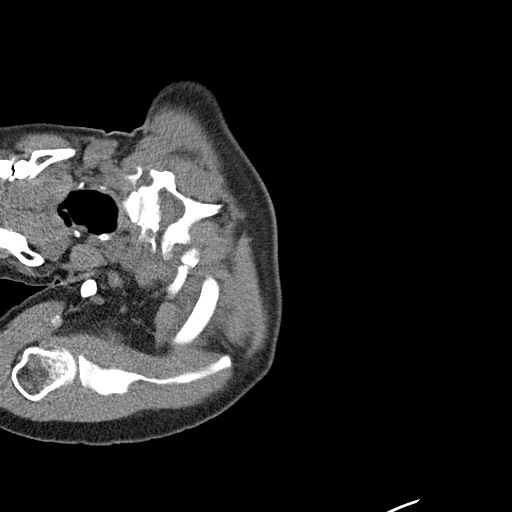
[im 87/91  lung]
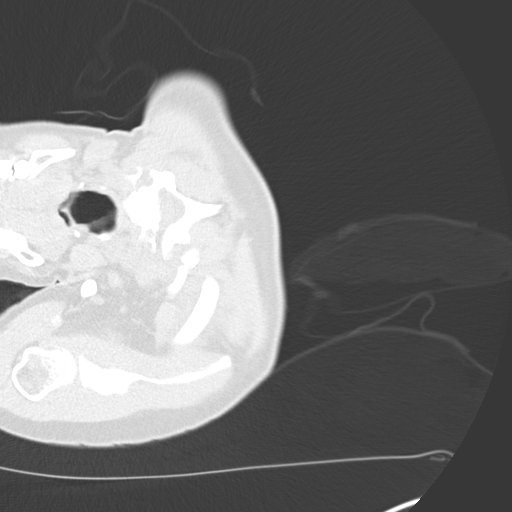

[15 of 32 positions shown; findings below may reference images not displayed]

EXAM:
CT-guided biopsy of right lower lung mass

MEDICATIONS:
Per EMR

ANESTHESIA/SEDATION:
Local analgesia

FLUOROSCOPY TIME:  N/a

COMPLICATIONS:
None immediate.

PROCEDURE:
Informed written consent was obtained from the patient after a
thorough discussion of the procedural risks, benefits and
alternatives. All questions were addressed. Maximal Sterile Barrier
Technique was utilized including caps, mask, sterile gowns, sterile
gloves, sterile drape, hand hygiene and skin antiseptic. A timeout
was performed prior to the initiation of the procedure.

The patient was placed in the lateral decubitus position with right
side down on the CT exam table. Limited CT of the chest was
performed for planning purposes. This again demonstrated innumerable
bilateral pulmonary masses. Inappropriate right lower lung
subpleural nodule/mass was selected as appropriate for percutaneous
biopsy. Skin entry site was marked, and the overlying skin was
prepped and draped in the standard sterile fashion. Local analgesia
was obtained with 1% lidocaine. Using intermittent CT fluoroscopy, a
17 gauge introducer needle was advanced towards the target
subpleural nodule in the right lower lobe. Subsequently, core needle
biopsy was performed using an 18 gauge core biopsy device x4 total
passes. Grossly adequate appearing specimens were submitted in
formalin to pathology for further handling. Needle was removed and
an occlusive dressing was placed. Postprocedure imaging demonstrated
small volume bleeding surrounding the biopsied mass within expected
limits and found to be stable over a short monitoring interval.
Patient overall tolerated the procedure well, and was transferred to
recovery in stable condition.
IMPRESSION: Successful CT-guided core needle biopsy of right lower lung
pulmonary nodule/mass.

## 2023-01-17 IMAGING — DX DG CHEST 1V PORT
1 series · 1 of 1 positions shown · non-contrast
Comparison: December 06, 2021.

CLINICAL DATA: Hypoxia.

EXAM:
PORTABLE CHEST 1 VIEW

[chest ap]
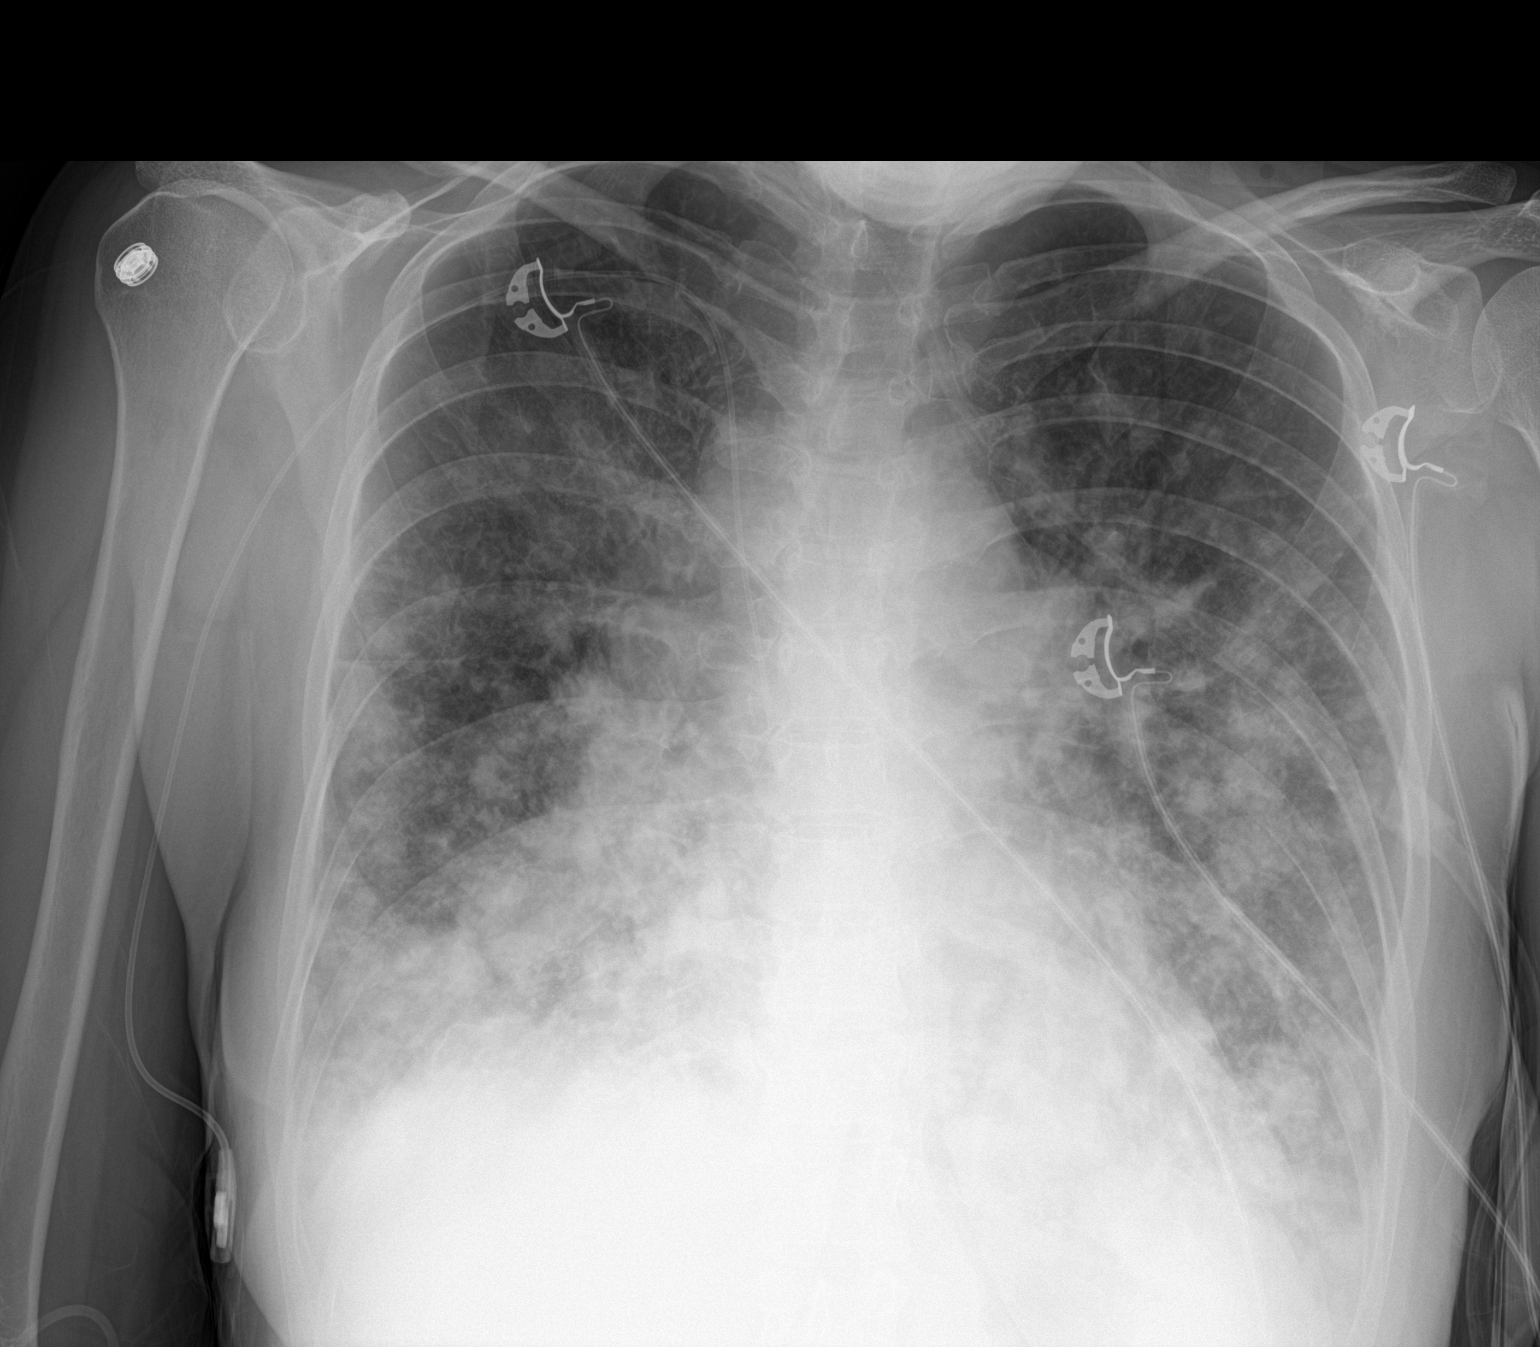

[1 of 1 positions shown; findings below may reference images not displayed]

FINDINGS: Stable cardiomediastinal silhouette. Right-sided PICC line is
unchanged in position. Stable nodular opacities are noted throughout
both lungs consistent with metastatic disease. Small bilateral
pleural effusions may be present. Bony thorax is unremarkable.
IMPRESSION: Stable nodular opacities are noted throughout both lungs most
consistent with metastatic disease. Small pleural effusions may be
present.
